# Patient Record
Sex: Female | Born: 1935 | Race: White | Hispanic: No | Marital: Married | Smoking: Never smoker
Health system: Southern US, Community
[De-identification: ages and names within clinical notes are randomized; demographics above are authoritative.]

## PROBLEM LIST (undated history)

## (undated) DIAGNOSIS — L80 Vitiligo: Secondary | ICD-10-CM

## (undated) DIAGNOSIS — E7800 Pure hypercholesterolemia, unspecified: Secondary | ICD-10-CM

## (undated) DIAGNOSIS — I10 Essential (primary) hypertension: Secondary | ICD-10-CM

## (undated) DIAGNOSIS — M353 Polymyalgia rheumatica: Secondary | ICD-10-CM

## (undated) DIAGNOSIS — M40209 Unspecified kyphosis, site unspecified: Secondary | ICD-10-CM

## (undated) DIAGNOSIS — M419 Scoliosis, unspecified: Secondary | ICD-10-CM

## (undated) DIAGNOSIS — A809 Acute poliomyelitis, unspecified: Secondary | ICD-10-CM

## (undated) HISTORY — PX: CATARACT EXTRACTION, BILATERAL: SHX1313

## (undated) HISTORY — DX: Pure hypercholesterolemia, unspecified: E78.00

## (undated) HISTORY — DX: Vitiligo: L80

## (undated) HISTORY — PX: TOTAL SHOULDER ARTHROPLASTY: SHX126

## (undated) HISTORY — DX: Acute poliomyelitis, unspecified: A80.9

## (undated) HISTORY — DX: Polymyalgia rheumatica: M35.3

## (undated) HISTORY — PX: SHOULDER FUSION SURGERY: SHX775

## (undated) HISTORY — DX: Scoliosis, unspecified: M41.9

## (undated) HISTORY — DX: Essential (primary) hypertension: I10

## (undated) HISTORY — DX: Unspecified kyphosis, site unspecified: M40.209

---

## 1991-04-02 HISTORY — PX: ABDOMINAL HYSTERECTOMY: SHX81

## 1999-01-26 ENCOUNTER — Other Ambulatory Visit: Admission: RE | Admit: 1999-01-26 | Discharge: 1999-01-26 | Payer: Self-pay | Admitting: *Deleted

## 1999-08-24 ENCOUNTER — Encounter: Payer: Self-pay | Admitting: Orthopedic Surgery

## 1999-08-24 ENCOUNTER — Ambulatory Visit (HOSPITAL_COMMUNITY): Admission: RE | Admit: 1999-08-24 | Discharge: 1999-08-24 | Payer: Self-pay | Admitting: Orthopedic Surgery

## 2000-04-10 ENCOUNTER — Other Ambulatory Visit: Admission: RE | Admit: 2000-04-10 | Discharge: 2000-04-10 | Payer: Self-pay | Admitting: *Deleted

## 2001-12-17 ENCOUNTER — Ambulatory Visit (HOSPITAL_COMMUNITY): Admission: RE | Admit: 2001-12-17 | Discharge: 2001-12-17 | Payer: Self-pay | Admitting: Gastroenterology

## 2003-08-09 ENCOUNTER — Other Ambulatory Visit: Admission: RE | Admit: 2003-08-09 | Discharge: 2003-08-09 | Payer: Self-pay | Admitting: Obstetrics and Gynecology

## 2008-06-01 ENCOUNTER — Ambulatory Visit: Payer: Self-pay | Admitting: Oncology

## 2008-06-14 LAB — CBC WITH DIFFERENTIAL/PLATELET
BASO%: 0 % (ref 0.0–2.0)
Basophils Absolute: 0 10*3/uL (ref 0.0–0.1)
Eosinophils Absolute: 0 10*3/uL (ref 0.0–0.5)
HCT: 42.2 % (ref 34.8–46.6)
HGB: 14.1 g/dL (ref 11.6–15.9)
LYMPH%: 8.1 % — ABNORMAL LOW (ref 14.0–49.7)
MCHC: 33.5 g/dL (ref 31.5–36.0)
MONO#: 0.1 10*3/uL (ref 0.1–0.9)
NEUT#: 7.3 10*3/uL — ABNORMAL HIGH (ref 1.5–6.5)
NEUT%: 90.7 % — ABNORMAL HIGH (ref 38.4–76.8)
Platelets: 252 10*3/uL (ref 145–400)
WBC: 8 10*3/uL (ref 3.9–10.3)
lymph#: 0.6 10*3/uL — ABNORMAL LOW (ref 0.9–3.3)

## 2008-06-14 LAB — COMPREHENSIVE METABOLIC PANEL
AST: 21 U/L (ref 0–37)
Albumin: 4 g/dL (ref 3.5–5.2)
BUN: 18 mg/dL (ref 6–23)
Calcium: 10 mg/dL (ref 8.4–10.5)
Chloride: 99 mEq/L (ref 96–112)
Glucose, Bld: 167 mg/dL — ABNORMAL HIGH (ref 70–99)
Potassium: 3.8 mEq/L (ref 3.5–5.3)
Sodium: 139 mEq/L (ref 135–145)
Total Protein: 6.9 g/dL (ref 6.0–8.3)

## 2008-06-14 LAB — URIC ACID: Uric Acid, Serum: 4.8 mg/dL (ref 2.4–7.0)

## 2008-06-16 LAB — IMMUNOFIXATION ELECTROPHORESIS: Total Protein, Serum Electrophoresis: 7.3 g/dL (ref 6.0–8.3)

## 2008-06-16 LAB — KAPPA/LAMBDA LIGHT CHAINS
Kappa free light chain: <0.6 mg/dL (ref 0.33–1.94)
Lambda Free Lght Chn: 1.51 mg/dL (ref 0.57–2.63)

## 2008-09-20 ENCOUNTER — Ambulatory Visit: Payer: Self-pay | Admitting: Oncology

## 2008-09-20 LAB — COMPREHENSIVE METABOLIC PANEL
ALT: 13 U/L (ref 0–35)
AST: 14 U/L (ref 0–37)
Alkaline Phosphatase: 36 U/L — ABNORMAL LOW (ref 39–117)
Calcium: 9.5 mg/dL (ref 8.4–10.5)
Chloride: 96 mEq/L (ref 96–112)
Creatinine, Ser: 0.7 mg/dL (ref 0.40–1.20)
Potassium: 3.6 mEq/L (ref 3.5–5.3)

## 2008-09-20 LAB — IGG, IGA, IGM
IgA: 138 mg/dL (ref 68–378)
IgM, Serum: 124 mg/dL (ref 60–263)

## 2008-09-20 LAB — CBC WITH DIFFERENTIAL/PLATELET
BASO%: 0.5 % (ref 0.0–2.0)
EOS%: 0.9 % (ref 0.0–7.0)
MCH: 32.1 pg (ref 25.1–34.0)
MCHC: 34.5 g/dL (ref 31.5–36.0)
MCV: 93 fL (ref 79.5–101.0)
MONO%: 9 % (ref 0.0–14.0)
NEUT%: 63.3 % (ref 38.4–76.8)
RDW: 12.9 % (ref 11.2–14.5)
lymph#: 1.3 10*3/uL (ref 0.9–3.3)

## 2008-09-22 LAB — UIFE/LIGHT CHAINS/TP QN, 24-HR UR
Free Kappa Lt Chains,Ur: 0.66 mg/dL (ref 0.04–1.51)
Free Lambda Lt Chains,Ur: <0.05 mg/dL (ref 0.08–1.01)
Free Lt Chn Excr Rate: 12.05 mg/d
Time: 24 hours
Total Protein, Urine-Ur/day: 22 mg/d (ref 10–140)
Total Protein, Urine: 1.2 mg/dL

## 2008-09-22 LAB — CREATININE CLEARANCE, URINE, 24 HOUR
Collection Interval-CRCL: 24 hours
Creatinine Clearance: 65 mL/min — ABNORMAL LOW (ref 75–115)

## 2008-11-02 ENCOUNTER — Inpatient Hospital Stay (HOSPITAL_COMMUNITY): Admission: EM | Admit: 2008-11-02 | Discharge: 2008-11-07 | Payer: Self-pay | Admitting: Emergency Medicine

## 2009-04-07 ENCOUNTER — Ambulatory Visit: Payer: Self-pay | Admitting: Oncology

## 2009-04-07 LAB — COMPREHENSIVE METABOLIC PANEL
ALT: 14 U/L (ref 0–35)
Albumin: 4.3 g/dL (ref 3.5–5.2)
CO2: 26 mEq/L (ref 19–32)
Glucose, Bld: 127 mg/dL — ABNORMAL HIGH (ref 70–99)
Potassium: 3.7 mEq/L (ref 3.5–5.3)
Sodium: 143 mEq/L (ref 135–145)
Total Protein: 6.7 g/dL (ref 6.0–8.3)

## 2009-04-07 LAB — LACTATE DEHYDROGENASE: LDH: 155 U/L (ref 94–250)

## 2009-04-07 LAB — CBC WITH DIFFERENTIAL/PLATELET
Eosinophils Absolute: 0 10*3/uL (ref 0.0–0.5)
MONO#: 0.3 10*3/uL (ref 0.1–0.9)
NEUT#: 3.3 10*3/uL (ref 1.5–6.5)
RBC: 4.55 10*6/uL (ref 3.70–5.45)
RDW: 13.7 % (ref 11.2–14.5)
WBC: 5.2 10*3/uL (ref 3.9–10.3)
lymph#: 1.6 10*3/uL (ref 0.9–3.3)

## 2009-04-07 LAB — IGG, IGA, IGM: IgG (Immunoglobin G), Serum: 685 mg/dL — ABNORMAL LOW (ref 694–1618)

## 2009-10-11 ENCOUNTER — Ambulatory Visit: Payer: Self-pay | Admitting: Oncology

## 2009-10-13 LAB — CBC WITH DIFFERENTIAL/PLATELET
Eosinophils Absolute: 0 10*3/uL (ref 0.0–0.5)
HCT: 38.2 % (ref 34.8–46.6)
LYMPH%: 18.9 % (ref 14.0–49.7)
MCHC: 34.5 g/dL (ref 31.5–36.0)
MCV: 91.8 fL (ref 79.5–101.0)
MONO%: 9.5 % (ref 0.0–14.0)
NEUT#: 3.8 10*3/uL (ref 1.5–6.5)
NEUT%: 70.5 % (ref 38.4–76.8)
Platelets: 231 10*3/uL (ref 145–400)
RBC: 4.16 10*6/uL (ref 3.70–5.45)

## 2010-04-13 ENCOUNTER — Ambulatory Visit: Payer: Self-pay | Admitting: Oncology

## 2010-04-17 LAB — CBC WITH DIFFERENTIAL/PLATELET
BASO%: 0.4 % (ref 0.0–2.0)
Basophils Absolute: 0 10*3/uL (ref 0.0–0.1)
EOS%: 3 % (ref 0.0–7.0)
Eosinophils Absolute: 0.1 10*3/uL (ref 0.0–0.5)
HCT: 40.2 % (ref 34.8–46.6)
HGB: 13.5 g/dL (ref 11.6–15.9)
LYMPH%: 24.2 % (ref 14.0–49.7)
MCH: 30.7 pg (ref 25.1–34.0)
MCHC: 33.5 g/dL (ref 31.5–36.0)
MCV: 91.6 fL (ref 79.5–101.0)
MONO#: 0.3 10*3/uL (ref 0.1–0.9)
MONO%: 5.9 % (ref 0.0–14.0)
NEUT#: 2.8 10*3/uL (ref 1.5–6.5)
NEUT%: 66.5 % (ref 38.4–76.8)
Platelets: 248 10*3/uL (ref 145–400)
RBC: 4.39 10*6/uL (ref 3.70–5.45)
RDW: 12.2 % (ref 11.2–14.5)
WBC: 4.3 10*3/uL (ref 3.9–10.3)
lymph#: 1 10*3/uL (ref 0.9–3.3)

## 2010-04-19 LAB — COMPREHENSIVE METABOLIC PANEL
ALT: 13 U/L (ref 0–35)
AST: 17 U/L (ref 0–37)
Albumin: 4.5 g/dL (ref 3.5–5.2)
Alkaline Phosphatase: 69 U/L (ref 39–117)
BUN: 16 mg/dL (ref 6–23)
CO2: 31 mEq/L (ref 19–32)
Calcium: 9.9 mg/dL (ref 8.4–10.5)
Chloride: 96 mEq/L (ref 96–112)
Creatinine, Ser: 0.64 mg/dL (ref 0.40–1.20)
Glucose, Bld: 140 mg/dL — ABNORMAL HIGH (ref 70–99)
Potassium: 3.8 mEq/L (ref 3.5–5.3)
Sodium: 139 mEq/L (ref 135–145)
Total Bilirubin: 0.5 mg/dL (ref 0.3–1.2)
Total Protein: 7.1 g/dL (ref 6.0–8.3)

## 2010-04-19 LAB — IMMUNOFIXATION ELECTROPHORESIS
IgA: 200 mg/dL (ref 68–378)
IgG (Immunoglobin G), Serum: 1000 mg/dL (ref 694–1618)
IgM, Serum: 216 mg/dL (ref 60–263)
Total Protein, Serum Electrophoresis: 7.1 g/dL (ref 6.0–8.3)

## 2010-04-19 LAB — LACTATE DEHYDROGENASE: LDH: 142 U/L (ref 94–250)

## 2010-07-07 LAB — CBC
Hemoglobin: 11.5 g/dL — ABNORMAL LOW (ref 12.0–15.0)
Hemoglobin: 11.8 g/dL — ABNORMAL LOW (ref 12.0–15.0)
MCHC: 33.8 g/dL (ref 30.0–36.0)
MCHC: 34 g/dL (ref 30.0–36.0)
MCHC: 34.2 g/dL (ref 30.0–36.0)
MCV: 92.8 fL (ref 78.0–100.0)
MCV: 92.9 fL (ref 78.0–100.0)
Platelets: 209 10*3/uL (ref 150–400)
RBC: 3.68 MIL/uL — ABNORMAL LOW (ref 3.87–5.11)
RBC: 3.75 MIL/uL — ABNORMAL LOW (ref 3.87–5.11)
RBC: 4.15 MIL/uL (ref 3.87–5.11)
RDW: 13.1 % (ref 11.5–15.5)
RDW: 13.2 % (ref 11.5–15.5)
WBC: 5.1 10*3/uL (ref 4.0–10.5)

## 2010-07-07 LAB — BASIC METABOLIC PANEL
BUN: 10 mg/dL (ref 6–23)
CO2: 29 mEq/L (ref 19–32)
CO2: 29 mEq/L (ref 19–32)
Calcium: 8.4 mg/dL (ref 8.4–10.5)
Calcium: 9 mg/dL (ref 8.4–10.5)
Chloride: 100 mEq/L (ref 96–112)
Chloride: 98 mEq/L (ref 96–112)
Creatinine, Ser: 0.56 mg/dL (ref 0.4–1.2)
Creatinine, Ser: 0.57 mg/dL (ref 0.4–1.2)
GFR calc Af Amer: >60 mL/min (ref >60)
GFR calc Af Amer: >60 mL/min (ref >60)
Glucose, Bld: 94 mg/dL (ref 70–99)

## 2010-07-07 LAB — DIFFERENTIAL
Basophils Absolute: 0.1 10*3/uL (ref 0.0–0.1)
Basophils Relative: 1 % (ref 0–1)
Eosinophils Absolute: 0 10*3/uL (ref 0.0–0.7)
Neutro Abs: 9.2 10*3/uL — ABNORMAL HIGH (ref 1.7–7.7)
Neutrophils Relative %: 88 % — ABNORMAL HIGH (ref 43–77)

## 2010-07-07 LAB — SEDIMENTATION RATE: Sed Rate: 20 mm/hr (ref 0–22)

## 2010-07-07 LAB — LIPID PANEL
Cholesterol: 176 mg/dL (ref 0–200)
HDL: 94 mg/dL (ref >39)
Triglycerides: 68 mg/dL (ref <150)

## 2010-07-07 LAB — C-REACTIVE PROTEIN: CRP: 4.3 mg/dL — ABNORMAL HIGH (ref <0.6)

## 2010-08-14 NOTE — H&P (Signed)
Ellen Thomas, Ellen Thomas            ACCOUNT NO.:  [id number]   MEDICAL RECORD NO.:  [id number]          PATIENT TYPE:  INP   LOCATION:  0103                         FACILITY:  [facility] ([place]   PHYSICIAN:  Virgie Dad, MD     DATE OF BIRTH:  1935-11-28   DATE OF ADMISSION:  11/02/2008  DATE OF DISCHARGE:                              HISTORY & PHYSICAL   PRIMARY CARE PHYSICIAN:  Dr. Everlene Other.   ORTHOPEDIC DOCTOR:  Dr. Renae Fickle.   CHIEF COMPLAINT:  Fall injury, painful right hip.   HISTORY OF PRESENT ILLNESS:  This is a 75 year old white female, known  case of childhood polio, on disability from polio, known case of  polymyalgia rheumatica on prednisone therapy, known hypertension, is  admitted through the emergency room for management of fracture of the  right superior and inferior pubic rami after a fall injury this morning.   She was inside her room and for some reason she fell, falling backwards,  and hit the right hip.  She was not able to get up and workup in the  emergency room showed that she has right superior and inferior pubic  rami.  CT scan showed mild atrophy and mild microvascular white matter  disease, old lacunar infarct on the left basal ganglia.  CT scan also  confirmed a small hematoma right posterior scalp near the vertex, no  obvious skull fracture.  CT of the cervical spine showed degenerative  disk disease, relative spinal stenosis C6, C7, no acute findings.  Blood  work in the emergency room showed white count of 10.5, hemoglobin 13.1,  hematocrit 38.6, platelet count 209,000, blood sugar 100, BUN 10,  creatinine 0.57, calcium 9, the serum sodium is 135, potassium is 3.4.   Patient denies any headache or any syncopal attacks.  She does not have  any neurologic complaints in reference to the fall.   PAST HISTORY:  1. 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  She is currently on      disability of the  polio, affecting mostly her left side.  2. Status post hysterectomy.  3. Status post bone graft taken from the right hip and to correct the      bony abnormality on the left shoulder from polio.   CURRENT MEDICAL CONDITIONS:  She has polymyalgia rheumatica, discovered  two years ago and has been on decreasing dose of prednisone.  She is now  taking prednisone 5 mg daily.  She does not know her current sed rate or  CRP.  Mostly, the polymyalgia rheumatica effects the left shoulder,  bilateral arms and bilateral hips.   CURRENT MEDICATIONS:  1. Prednisone 5 mg daily.  2. Lisinopril 10 mg daily for hypertension.  3. Fosamax 75 mg weekly on Sunday for her osteopenia/osteoporosis.  4. Crestor 5 mg daily for hyperlipidemia.   FAMILY HISTORY:  Mother was diabetic, father died of pneumonia.  Her  siblings, brothers and sisters, are well and have no serious medical  conditions.   REVIEW OF SYSTEMS:  Right now she has a hematoma of the vertex from the  fall, has some headache or ache and pain localized to the new hematoma.  No dizziness.  HEENT:  No visual complaints, has glasses.  RESPIRATORY:  No cough, no shortness of breath.  HEART:  No chest pain, no shortness  of breath, no palpitations, no exertional dyspnea, no dyspnea at rest,  no orthopnea.  GASTROINTESTINAL:  Nauseous at this point in time but no  chronic complaints in reference to the gastrointestinal tract, although  initially had problems taking the Fosamax but now she has tolerated it  well.  GENITOURINARY:  Status post hysterectomy, not on any estrogen  replacement therapy, no vaginal discharge or pruritus, no urinary  complaints, no polyuria, no dysuria.   PHYSICAL EXAMINATION:  VITAL SIGNS:  Temperature is 97.7, blood pressure  150/60, pulse is 73, respirations 16, O2 sat 100%.  SKIN:  No open abrasion except for a small hematoma with no gross  bleeding over the vertex of the skull, no sign of depressed fracture in  the  head or calvarium.  HEENT:  Pupils are equal and reacting to light.  Funduscopy not done,  the patient states the light hurts her eye and she can not keep still  because of the generalized pain from the recent fall.  There is no  cataract, there is no scleral icterus.  Pupils are equal and reacting to  light.  NECK:  Supple, no meningismus, the carotids are normal in upstroke, no  bruit.  Thyroid normal in size, no goiter.  The neck veins are not  distended.  CHEST:  Clear to palpation and auscultation, no wheezing, no rales.  COR:  Ranged between 70 - 90 per minute, normal rhythm, sinus S1 - S2  normal, no murmur, no gallop, no S4, PMI normal locations.  ABDOMEN:  Scaphoid, easy to palpate liver and spleen, bowel sounds are  normal, no tenderness, no guarding.  EXTREMITIES:  There is a limited range of motion of the right hip with  tenderness even on passive range of motion.  The left hip normal range  of motion.  LOWER EXTREMITIES:  The left calf is more pronounced than the right with  bilateral muscular atrophy from the polio in childhood.  Reflexes are  normal, there is no paresis.  NEUROLOGIC:  Cerebral function:  Memory intact, alert, awake, oriented.  Cerebellar function:  Finger to nose test is intact, gait not tested  because of the recent fall injury with fracture of the right hip.  Cranial nerves and spinal nerves are normal.  Noted generalized muscular  atrophy of the lower extremities with more prominence of the calf muscle  on the left lower leg but no sign of DVT.  Peripheral arterial  circulation is intact, pedal pulses are normal.   ASSESSMENT AND PLAN:  This is a 75 year old white female, known case of  polio in childhood with disability from polio, known case of polymyagia  rheumatica on prednisone therapy, is admitted for medical management and  rehab of nondisplaced fracture of the right superior and inferior rami  of the right hip.  1. Nondisplaced fracture of  the right superior and inferior pubic      ramus fracture right hip.  Dr. Renae Fickle, her family orthopedic      according to the emergency department doctor, has been notified      about this admission and he will swing by to see the patient.  Standard orthopedic fracture orders are done.  Analgesia with      oxycodone 5 mg q.4 hours p.r.n. for pain, morphine 2 mg IV q.4      hours p.r.n. for severe pain.  2. Hematoma vertex with no open wound.  CT of the head no fracture of      the calvarium, no new cerebrovascular accident.  Will monitor      neurologic vital signs q.4 hours for 24 hrs  3. Polymyalgia rheumatica, continue prednisone 5 mg daily.  Will not      give stress dose of hydrocortisone, patient seems to be doing well,      vital signs are normal and no need to give stress dose      hydrocortisone unless for some reason she goes for surgical      intervention which is very unlikely with the location of the      fracture.  Will order CRP and sed rate.  Patient advised to      continue after discharge to keep an appointment with her      rheumatologist.  4. Hypertension, current blood pressure 150/60, continue lisinopril 10      mg p.o. daily.  5. Hyperlipidemia, continue Crestor 5 mg bedtime, do fasting lipids in      the morning.  6. Osteoporosis/osteopenia, continue Fosamax 75 mg once weekly,      usually taken every Sunday.  Patient does not know when her last      DEXA scan was.   PROGNOSIS:  Good.   ETHICS:  Full code.      Virgie Dad, MD  Electronically Signed     EA/MEDQ  D:  11/02/2008  T:  11/02/2008  Job:  [id number]   cc:   Tracey Harries, M.D.  Fax: [phone]

## 2010-08-14 NOTE — Discharge Summary (Signed)
Ellen Thomas, Ellen Thomas            ACCOUNT NO.:  [id number]   MEDICAL RECORD NO.:  [id number]          PATIENT TYPE:  INP   LOCATION:  1504                         FACILITY:  [facility]   PHYSICIAN:  Hillery Aldo, M.D.   DATE OF BIRTH:  [birth year]   DATE OF ADMISSION:  11/02/2008  DATE OF DISCHARGE:  11/06/2008                               DISCHARGE SUMMARY   PRIMARY CARE PHYSICIAN:  Dr. Everlene Other.   ORTHOPEDIC SURGEON:  Dr. Renae Fickle.   DISCHARGE DIAGNOSES:  1. Traumatic fall.  2. Right superior and inferior pubic rami fractures.  3. Small scalp hematoma.  4. Spinal stenosis of C6-C7.  5. 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  6. Polymyalgia rheumatica.  7. Hypertension.  8. Dyslipidemia.  9. Osteoporosis.  10.Cerebrovascular disease with old lacunar infarcts noted on CT of      the head.   DISCHARGE MEDICATIONS:  1. Prednisone 5 mg p.o. daily.  2. Crestor or 5 mg p.o. q. p.m.  3. Lisinopril increased to 30 mg p.o. daily.  4. Fosamax 70 mg p.o. q. Sunday.  5. Tums 500 to 1000 mg p.o. q.4 hours p.r.n. dyspepsia.  6. Oxycodone 2.5 to 5 mg p.o. q.4 h p.r.n. pain.  7. MiraLax 17 grams in 8 ounces of fluids daily p.r.n.  8. Colace 100 mg p.o. daily.  9. Aspirin 81 mg daily.   CONSULTATIONS:  Dr. Renae Fickle of orthopedics.   BRIEF ADMISSION HISTORY OF PRESENT ILLNESS:  The patient is a 75-year-  old female who presented to the hospital after a fall injury.  The  patient had a conscious fall, landing on her right hip with significant  pain afterwards.  She subsequently presented to the hospital for  evaluation and was noted to have a right superior and inferior rami  fracture.  She subsequently was referred to the hospitalist service for  evaluation and treatment.  For full details, please see the dictated  report done by Dr. Axel Filler.   PROCEDURES AND DIAGNOSTIC STUDIES:  1. CT scan of the head showed mild atrophy and moderate microvascular      white matter disease with an old  lacunar infarct in the left basal      ganglia.  Small hematoma in the right posterior scalp and the      vertex.  No obvious fracture of the calvarium or other acute      findings.  2. CT scan of the cervical spine on November 02, 2008 showed degenerative      disk disease.  Relative spinal stenosis C6-C7.  No other acute      findings.  3. Right femur film on November 02, 2008 showed no femur fracture or      dislocation.  4. Right hip film on November 02, 2008 showed right superior inferior      pubic ramus fractures.   DISCHARGE LABORATORY VALUES:  White blood cell count was 5.5, hemoglobin  11.5, hematocrit 34.1, platelets 153,000.  Sodium was 134, potassium  3.7, chloride 100, bicarb 29, BUN 8, creatinine 0.56, glucose 94,  calcium 8.4.  Sedimentation rate was 20.  Cholesterol  was 176,  triglycerides 68, HDL 94, LDL 68.   HOSPITAL COURSE BY PROBLEM:  1. Traumatic fall with acute right superior and inferior pubic ramus      fractures:  The patient was seen and evaluated by Dr. Renae Fickle of      orthopedic surgery as well as PT and OT.  Recommendations were for      skilled nursing facility placement for rehab.  At this point, the      patient's pain is well-controlled on oxycodone and the plan is to      discharge her to the rehab facility in the morning.  2. Polymyalgia rheumatica:  The patient was maintained on her usual      dose of prednisone.  Her sed rate is within normal limits.  She      should follow up with her primary care physician regarding timing      as to when to begin to titrate prednisone further.  3. Hypertension:  The patient's blood pressure was suboptimally      controlled and her ACE inhibitor dose was titrated up.  At this      point, we are titrating up to 30 mg daily.  She will need      outpatient followup to determine if her blood pressure is well      controlled with this change.  4. Dyslipidemia:  Fasting lipid panel checked and found to be within      normal  limits.  Her lipids are well controlled on her usual dose of      Crestor.  5. Osteoporosis:  The patient's Fosamax was held secondary to the      hospital policy regarding bisphosphonates.  This can be resumed at      discharge.  6. Cerebrovascular disease with old lacunar infarct:  The patient was      put on low-dose aspirin therapy and attention was made towards      appropriate blood pressure control.   DISPOSITION:  The patient is medically stable and will be discharged to  her skilled nursing facility for rehab in the morning.   CONDITION ON DISCHARGE:  Improved.      Hillery Aldo, M.D.  Electronically Signed     CR/MEDQ  D:  11/06/2008  T:  11/06/2008  Job:  [id number]   cc:   [name], M.D.  Fax: [phone]   Tracey Harries, M.D.  Fax: [phone]

## 2010-08-17 NOTE — Op Note (Signed)
   NAME:  Ellen Thomas, Ellen Thomas                      ACCOUNT NO.:  [id number]   MEDICAL RECORD NO.:  [id number]                   PATIENT TYPE:  AMB   LOCATION:  ENDO                                 FACILITY:  [facility]   PHYSICIAN:  James L. Malon Kindle., M.D.          DATE OF BIRTH:  1936-01-18   DATE OF PROCEDURE:  12/17/2001  DATE OF DISCHARGE:                                 OPERATIVE REPORT   PROCEDURE PERFORMED:  Colonoscopy.   ENDOSCOPIST:  Llana Aliment. Edwards, M.D.   MEDICATIONS:  Fentanyl 60 mcg, Versed 6 mg IV.   INSTRUMENT USED:  Pediatric Olympus video colonoscope.   INDICATIONS FOR PROCEDURE:  Colon cancer screening.   DESCRIPTION OF PROCEDURE:  The procedure had been explained to the patient  and consent obtained.  With the patient in the left lateral decubitus  position, the pediatric Olympus video colonoscope was inserted and advanced  under direct visualization.  The prep was excellent.  We were able to reach  the cecum without difficulty using abdominal pressure.  The ileocecal valve  and appendiceal orifice were seen.  The scope was withdrawn and the cecum,  ascending colon, hepatic flexure, transverse colon, splenic flexure,  descending and sigmoid colon were seen well.  Scattered diverticula in the  sigmoid colon.  No polyps were seen throughout the entire colon.  The rectum  was free of polyps.  No real significant internal hemorrhoids.   ASSESSMENT:  1. Normal colonoscopy other than scattered diverticulosis of a moderate     nature in the sigmoid colon, no polyps were seen.   PLAN:  We recommend yearly hemoccults, possible screening colon in 10 years  depending on the results at that time and return as needed.                                               James L. Malon Kindle., M.D.    Waldron Session  D:  12/17/2001  T:  12/18/2001  Job:  [id number]   cc:   Aura Dials, M.D.

## 2010-10-16 ENCOUNTER — Encounter (HOSPITAL_BASED_OUTPATIENT_CLINIC_OR_DEPARTMENT_OTHER): Payer: Medicare Other | Admitting: Oncology

## 2010-10-16 ENCOUNTER — Other Ambulatory Visit (HOSPITAL_COMMUNITY): Payer: Self-pay | Admitting: Oncology

## 2010-10-16 DIAGNOSIS — D472 Monoclonal gammopathy: Secondary | ICD-10-CM

## 2010-10-16 LAB — CBC WITH DIFFERENTIAL/PLATELET
Basophils Absolute: 0 10*3/uL (ref 0.0–0.1)
EOS%: 0.1 % (ref 0.0–7.0)
MCHC: 33.7 g/dL (ref 31.5–36.0)
MCV: 89.6 fL (ref 79.5–101.0)
NEUT#: 5.9 10*3/uL (ref 1.5–6.5)
NEUT%: 87.7 % — ABNORMAL HIGH (ref 38.4–76.8)
Platelets: 209 10*3/uL (ref 145–400)
RBC: 4.17 10*6/uL (ref 3.70–5.45)
RDW: 14.9 % — ABNORMAL HIGH (ref 11.2–14.5)
WBC: 6.8 10*3/uL (ref 3.9–10.3)

## 2010-10-17 LAB — COMPREHENSIVE METABOLIC PANEL
BUN: 15 mg/dL (ref 6–23)
CO2: 28 mEq/L (ref 19–32)
Calcium: 9.7 mg/dL (ref 8.4–10.5)
Chloride: 95 mEq/L — ABNORMAL LOW (ref 96–112)
Creatinine, Ser: 0.64 mg/dL (ref 0.50–1.10)
Glucose, Bld: 156 mg/dL — ABNORMAL HIGH (ref 70–99)

## 2010-10-17 LAB — IGG, IGA, IGM
IgA: 156 mg/dL (ref 69–380)
IgM, Serum: 204 mg/dL (ref 52–322)

## 2010-10-17 LAB — LACTATE DEHYDROGENASE: LDH: 146 U/L (ref 94–250)

## 2011-03-28 ENCOUNTER — Telehealth: Payer: Self-pay | Admitting: Oncology

## 2011-03-28 NOTE — Telephone Encounter (Signed)
l/m to advise of changing appt from [place]inson to sara on 1/17  per robin from 03/28/11  aom

## 2011-04-18 ENCOUNTER — Other Ambulatory Visit: Payer: Medicare Other | Admitting: Lab

## 2011-04-18 ENCOUNTER — Ambulatory Visit (HOSPITAL_BASED_OUTPATIENT_CLINIC_OR_DEPARTMENT_OTHER): Payer: Medicare Other | Admitting: Physician Assistant

## 2011-04-18 ENCOUNTER — Other Ambulatory Visit (HOSPITAL_BASED_OUTPATIENT_CLINIC_OR_DEPARTMENT_OTHER): Payer: Medicare Other | Admitting: Lab

## 2011-04-18 VITALS — BP 146/61 | HR 70 | Temp 97.6°F | Ht 61.5 in | Wt 104.8 lb

## 2011-04-18 DIAGNOSIS — D472 Monoclonal gammopathy: Secondary | ICD-10-CM

## 2011-04-18 LAB — CBC WITH DIFFERENTIAL/PLATELET
BASO%: 0.5 % (ref 0.0–2.0)
Basophils Absolute: 0 10*3/uL (ref 0.0–0.1)
EOS%: 1.2 % (ref 0.0–7.0)
HGB: 14.2 g/dL (ref 11.6–15.9)
MCH: 31.9 pg (ref 25.1–34.0)
MCV: 94.2 fL (ref 79.5–101.0)
MONO%: 6.3 % (ref 0.0–14.0)
RBC: 4.45 10*6/uL (ref 3.70–5.45)
RDW: 12.4 % (ref 11.2–14.5)
lymph#: 1.3 10*3/uL (ref 0.9–3.3)

## 2011-04-18 NOTE — Progress Notes (Signed)
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  The patient reports since her last clinic visit in July 2012 that she has been taking her prednisone since last Fall, due to a flare of PMR. She is currently on 3mg  a day, switching in 4 days to 2 mg a day, to taper off early March. She is "almost asymptomatic, feeling a lot better since the flare"   She reports overall normal energy level.  No fevers, chills or night sweats.  No shortness of breath or cough.  She has normal appetite and has had no issues with nausea, vomiting, constipation or diarrhea.  No dysuria, urinary frequency or hematuria.  No alteration in sensation or balance or swelling of extremities.  She does report occasional discomfort in her groin and thigh area, which she states has been a chronic problem and has not changed over baseline.  She is not experiencing any discomfort today.  On 10/16/10 the IgG level was 888, IgA 156 and IgM 204, while on 04/17/2010 had been an IgG 1000, IgA 200 and IgM 216.  New labs are pending.  REVIEW OF SYSTEMS:  The rest of the 14-point review of system was negative.   Filed Vitals:   04/18/11 0837  BP: 146/61  Pulse: 70  Temp: 97.6 F (36.4 C)   Wt Readings from Last 3 Encounters:  04/18/11 104 lb 12.8 oz (47.537 kg)     MEDICATIONS:  Medicines are reviewed and recorded.     PHYSICAL EXAMINATION:    In general, this is a well-developed, well-nourished white female in no acute distress.  HEENT:  Sclerae nonicteric.  There is no thrush or mucositis.  Skin:  No rashes or lesions. Lymph:  No peripheral lymphadenopathy.  Cardiac:  Regular rate and rhythm without murmurs or gallops.  Peripheral pulses are palpable.  Chest:  Lungs are clear to auscultation.  Abdomen:  Positive bowel sounds, soft,  nontender.  No organomegaly.  Extremities:  Without edema or cyanosis.  Neuro:  Alert and oriented times three.  Strength, sensation and coordination all grossly intact.      LABORATORY/RADIOLOGY DATA:   Lab 04/18/11 0801  WBC 4.9  HGB 14.2  HCT 41.9  PLT 211  MCV 94.2  MCH 31.9  MCHC 33.8  RDW 12.4  LYMPHSABS 1.3  MONOABS 0.3  EOSABS 0.1  BASOSABS 0.0  BANDABS --    CMP Pending.  No results found for this basename: NA:5,K:5,CL:5,CO2:5,GLUCOSE:5,BUN:5,CREATININE:5,GFRCGP,:5,CALCIUM:5,MG:5,AST:5,ALT:5,ALKPHOS:5,BILITOT:5 in the last 168 hours      Component Value Date/Time   BILITOT 0.5 10/16/2010 1341   BILITOT 0.5 10/16/2010 1341     Radiology Studies:  No results found.     ASSESSMENT AND PLAN:  1.  [name] is a 76 year old white female with IgM lambda monoclonal gammopathy first detected December 2009.  Her quantitative immunoglobulins and serum immunofixation is currently pending and the patient will be contacted with results.  2.  We will tentatively schedule patient for follow-up lab visit in six months' time at which time we will reassess CBC with diff, CMET, LDH and quantitative immunoglobulins and then followup with Dr. Arline Asp in one year's time at which time we will reassess CBC with diff, CMET, LDH, quantitative immunoglobulin and immunofixation electrophoresis once current protein studies are reviewed by Dr. Arline Asp, we may make  adjustments in the patient's follow-up visits and she is notified of this.

## 2011-04-22 LAB — COMPREHENSIVE METABOLIC PANEL
ALT: 12 U/L (ref 0–35)
AST: 15 U/L (ref 0–37)
Albumin: 4.5 g/dL (ref 3.5–5.2)
Alkaline Phosphatase: 46 U/L (ref 39–117)
BUN: 15 mg/dL (ref 6–23)
Calcium: 9.6 mg/dL (ref 8.4–10.5)
Chloride: 99 mEq/L (ref 96–112)
Potassium: 3.7 mEq/L (ref 3.5–5.3)
Sodium: 138 mEq/L (ref 135–145)
Total Protein: 7.1 g/dL (ref 6.0–8.3)

## 2011-04-22 LAB — IMMUNOFIXATION ELECTROPHORESIS

## 2011-09-20 ENCOUNTER — Telehealth: Payer: Self-pay | Admitting: Oncology

## 2011-09-20 NOTE — Telephone Encounter (Signed)
s/w pt and moved her appt per dr dm req   aom

## 2011-10-08 ENCOUNTER — Telehealth: Payer: Self-pay | Admitting: Oncology

## 2011-10-08 NOTE — Telephone Encounter (Signed)
Time of 8/2 appt changed to 8:45 am (provider [employer]). lmonvm for pt and mailed new schedule.

## 2011-10-17 ENCOUNTER — Other Ambulatory Visit: Payer: Medicare Other | Admitting: Lab

## 2011-10-17 ENCOUNTER — Ambulatory Visit: Payer: Medicare Other | Admitting: Oncology

## 2011-11-01 ENCOUNTER — Ambulatory Visit (HOSPITAL_BASED_OUTPATIENT_CLINIC_OR_DEPARTMENT_OTHER): Payer: Medicare Other | Admitting: Oncology

## 2011-11-01 ENCOUNTER — Encounter: Payer: Self-pay | Admitting: Oncology

## 2011-11-01 ENCOUNTER — Other Ambulatory Visit: Payer: Medicare Other | Admitting: Lab

## 2011-11-01 VITALS — BP 127/62 | HR 84 | Temp 99.8°F | Resp 18 | Ht 61.5 in | Wt 102.9 lb

## 2011-11-01 DIAGNOSIS — D472 Monoclonal gammopathy: Secondary | ICD-10-CM

## 2011-11-01 DIAGNOSIS — M810 Age-related osteoporosis without current pathological fracture: Secondary | ICD-10-CM

## 2011-11-01 LAB — CBC WITH DIFFERENTIAL/PLATELET
Basophils Absolute: 0 10*3/uL (ref 0.0–0.1)
Eosinophils Absolute: 0.1 10*3/uL (ref 0.0–0.5)
HCT: 40.6 % (ref 34.8–46.6)
HGB: 13.7 g/dL (ref 11.6–15.9)
LYMPH%: 25.7 % (ref 14.0–49.7)
MCV: 91.2 fL (ref 79.5–101.0)
MONO%: 7.5 % (ref 0.0–14.0)
NEUT#: 2.5 10*3/uL (ref 1.5–6.5)
NEUT%: 64.1 % (ref 38.4–76.8)
Platelets: 207 10*3/uL (ref 145–400)
RDW: 13 % (ref 11.2–14.5)

## 2011-11-01 NOTE — Progress Notes (Signed)
CC:   Pollyann Savoy, M.D. Tracey Harries, M.D.  PROBLEM LIST: 1. IgM lambda monoclonal gammopathy, first detected in December 2009     with normal quantitative immunoglobulins, negative urine     immunofixation electrophoresis, and a normal 24-hour urine protein. 2. 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 3. Polymyalgia rheumatica diagnosed November 2009. 4. History of osteoporosis. 5. Dyslipidemia. 6. Hypertension.  MEDICATIONS: 1. Biotin 2500 mcg daily. 2. Cholecalciferol 1000 units daily. 3. Plaquenil 200 mg daily. 4. Prednisone, current dose 3 mg daily, adjusted for symptoms of     polymyalgia rheumatica. 5. Crestor 5 mg daily. 6. Micardis 20 mg daily.  SMOKING HISTORY:  The patient has never smoked cigarettes.  HISTORY:  The patient was seen today for followup of her IgM lambda monoclonal gammopathy, which at this point is most likely benign.  The patient has been followed conservatively.  She was last seen by [place] on 04/18/2011.  She is currently on prednisone 3 mg daily for her polymyalgia rheumatica.  The patient will try to decrease the dose by 1 mg a day every month.  She adjusts her prednisone according to symptoms. Quantitative immunoglobulin levels have been stable.  The patient is without symptoms to suggest either a lympho or plasmacytic disorder.  PHYSICAL EXAMINATION:  General:  The patient looks well.  Shows no obvious changes.  Vital Signs:  Weight is stable at 102.9 pounds, height 5 feet 1-1/2 inches, body surface area 1.42 sq m.  Blood pressure 127/62.  Temperature 99.8.  Other vital signs are normal.  HEENT:  There is no scleral icterus.  Mouth and pharynx are benign.  Lymph:  No peripheral adenopathy palpable.  Heart and Lungs:  Normal.  Abdomen: Benign with no organomegaly or masses palpable.  Extremities:  No peripheral edema or clubbing.  Neurologic Exam:  Normal.  No areas of musculoskeletal  tenderness.  LABORATORY DATA:  Today white count 3.9, ANC 2.5, hemoglobin 13.7, hematocrit 40.6, platelets 207,000.  Chemistries and quantitative immunoglobulins, also serum immunofixation electrophoresis are pending. Chemistries from 04/18/2011 were normal.  On 04/18/2011, IgG level was 765, IgA 135, and IgM level 228.  Serum immunofixation electrophoresis showed a monoclonal IgM lambda protein present.  IMAGING STUDIES: 1. CT scan of the cervical spine without IV contrast on 11/02/2008     showed degenerative disk disease and a relative spinal stenosis at     C6-7. 2. CT scan of the head without IV contrast on 11/02/2008 showed mild     atrophy and moderate microvascular white matter disease with an old     lacunar infarct in the left basal ganglia.  There was a small     hematoma in the right posterior scalp near the vertex.  There was     no obvious fracture of the calvarium or other acute findings.  IMPRESSION AND PLAN:  The patient remains with what we believe to be a benign monoclonal gammopathy.  I went over her lab data with her to better explain to her the reason for her referral here by Dr. Corliss Skains approximately 3-1/2 years ago and why we continue to follow her.  I explained that some patients with monoclonal gammopathy do have a malignant disease or may develop a malignant disease.  We will check quantitative immunoglobulins in 6 months and I would like to see the patient again in 1 year, at which time we will check CBC, chemistries, and quantitative immunoglobulins.    ______________________________  Samul Dada, M.D. DSM/MEDQ  D:  11/01/2011  T:  11/01/2011  Job:  [id number]

## 2011-11-01 NOTE — Progress Notes (Signed)
This office note has been dictated.  #[id number]

## 2011-11-05 LAB — COMPREHENSIVE METABOLIC PANEL
Albumin: 4.4 g/dL (ref 3.5–5.2)
Alkaline Phosphatase: 46 U/L (ref 39–117)
BUN: 13 mg/dL (ref 6–23)
Glucose, Bld: 82 mg/dL (ref 70–99)
Potassium: 4 mEq/L (ref 3.5–5.3)

## 2011-11-05 LAB — IMMUNOFIXATION ELECTROPHORESIS
IgG (Immunoglobin G), Serum: 752 mg/dL (ref 690–1700)
Total Protein, Serum Electrophoresis: 6.5 g/dL (ref 6.0–8.3)

## 2011-11-07 ENCOUNTER — Telehealth: Payer: Self-pay | Admitting: *Deleted

## 2011-11-07 NOTE — Telephone Encounter (Signed)
mailed out calendar and gave to the patient informing the patient of the new date and time on 05-04-2012 and 11-06-2012 lab and md appointment

## 2012-05-04 ENCOUNTER — Other Ambulatory Visit (HOSPITAL_BASED_OUTPATIENT_CLINIC_OR_DEPARTMENT_OTHER): Payer: Medicare PPO | Admitting: Lab

## 2012-05-04 DIAGNOSIS — D472 Monoclonal gammopathy: Secondary | ICD-10-CM

## 2012-05-04 LAB — IGG, IGA, IGM
IgA: 133 mg/dL (ref 69–380)
IgG (Immunoglobin G), Serum: 714 mg/dL (ref 690–1700)
IgM, Serum: 206 mg/dL (ref 52–322)

## 2012-11-05 ENCOUNTER — Telehealth: Payer: Self-pay | Admitting: Hematology and Oncology

## 2012-11-05 NOTE — Telephone Encounter (Signed)
Pt called today to r/s 8/8 appt to 9/23. Pt aware of new d/t.

## 2012-11-06 ENCOUNTER — Ambulatory Visit: Payer: Medicare Other

## 2012-11-06 ENCOUNTER — Other Ambulatory Visit: Payer: Medicare Other

## 2012-12-21 ENCOUNTER — Other Ambulatory Visit: Payer: Self-pay | Admitting: Medical Oncology

## 2012-12-21 DIAGNOSIS — D472 Monoclonal gammopathy: Secondary | ICD-10-CM

## 2012-12-22 ENCOUNTER — Telehealth: Payer: Self-pay | Admitting: Internal Medicine

## 2012-12-22 ENCOUNTER — Ambulatory Visit (HOSPITAL_BASED_OUTPATIENT_CLINIC_OR_DEPARTMENT_OTHER): Payer: Medicare PPO | Admitting: Internal Medicine

## 2012-12-22 ENCOUNTER — Other Ambulatory Visit (HOSPITAL_BASED_OUTPATIENT_CLINIC_OR_DEPARTMENT_OTHER): Payer: Medicare PPO | Admitting: Lab

## 2012-12-22 VITALS — BP 166/68 | HR 80 | Temp 98.0°F | Resp 18 | Ht 61.5 in | Wt 103.5 lb

## 2012-12-22 DIAGNOSIS — D472 Monoclonal gammopathy: Secondary | ICD-10-CM

## 2012-12-22 LAB — CBC WITH DIFFERENTIAL/PLATELET
Basophils Absolute: 0 10*3/uL (ref 0.0–0.1)
HCT: 39.9 % (ref 34.8–46.6)
HGB: 13.4 g/dL (ref 11.6–15.9)
LYMPH%: 19.1 % (ref 14.0–49.7)
MCH: 29.7 pg (ref 25.1–34.0)
MCV: 88.9 fL (ref 79.5–101.0)
MONO#: 0.4 10*3/uL (ref 0.1–0.9)
NEUT%: 70.2 % (ref 38.4–76.8)
Platelets: 214 10*3/uL (ref 145–400)
RBC: 4.49 10*6/uL (ref 3.70–5.45)
WBC: 4.6 10*3/uL (ref 3.9–10.3)
lymph#: 0.9 10*3/uL (ref 0.9–3.3)

## 2012-12-22 LAB — COMPREHENSIVE METABOLIC PANEL (CC13)
ALT: 11 U/L (ref 0–55)
Albumin: 4 g/dL (ref 3.5–5.0)
BUN: 10.6 mg/dL (ref 7.0–26.0)
CO2: 25 mEq/L (ref 22–29)
Calcium: 9.8 mg/dL (ref 8.4–10.4)
Chloride: 97 mEq/L — ABNORMAL LOW (ref 98–109)
Creatinine: 0.7 mg/dL (ref 0.6–1.1)
Total Bilirubin: 0.59 mg/dL (ref 0.20–1.20)

## 2012-12-22 LAB — IGG, IGA, IGM
IgA: 122 mg/dL (ref 69–380)
IgG (Immunoglobin G), Serum: 694 mg/dL (ref 690–1700)
IgM, Serum: 193 mg/dL (ref 52–322)

## 2012-12-22 NOTE — Telephone Encounter (Signed)
Gave pt appt for lab and MD on June 2014  °

## 2012-12-22 NOTE — Progress Notes (Signed)
[facility] OFFICE PROGRESS NOTE  No primary provider on file. No primary provider on file.  DIAGNOSIS: MGUS (monoclonal gammopathy of unknown significance)  CC: IgM lambda monoclonal gammopathy  CURRENT THERAPY: Observation  INTERVAL HISTORY: Ellen Thomas 77 y.o. female with a history of poliomyelitis with residual left-sided Thomas, Ellen rheumatica (2009) and recently MGUS presents for a one-year followup.  She was last seen by Dr. Arline Asp on 11/01/2011.  Today, she reports doing well overall.  She denies any recent hospitalizations or emergency room visits.  Her colonoscopy was done this year.  She denies fever or chills or bone pain or urinary changes.  She has been off her prednisone over the past year for her Ellen rheumatica.   MEDICAL HISTORY:No past medical history on file.  INTERIM HISTORY: has MGUS (monoclonal gammopathy of unknown significance) on her problem list.    ALLERGIES:  is allergic to tetracyclines & related.  MEDICATIONS: has a current medication list which includes the following prescription(s): biotin, cholecalciferol, hydroxychloroquine, rosuvastatin, and telmisartan.  SURGICAL HISTORY: No past surgical history on file.  REVIEW OF SYSTEMS:   Constitutional: Denies fevers, chills or abnormal weight loss Eyes: Denies blurriness of vision Ears, nose, mouth, throat, and face: Denies mucositis or sore throat Respiratory: Denies cough, dyspnea or wheezes Cardiovascular: Denies palpitation, chest discomfort or lower extremity swelling Gastrointestinal:  Denies nausea, heartburn or change in bowel habits Skin: Denies abnormal skin rashes Lymphatics: Denies new lymphadenopathy or easy bruising Neurological:Denies numbness, tingling or new weaknesses Behavioral/Psych: Mood is stable, no new changes  All other systems were reviewed with the patient and are negative.  PHYSICAL EXAMINATION: ECOG PERFORMANCE STATUS: 0 -  Asymptomatic  Blood pressure 166/68, pulse 80, temperature 98 F (36.7 C), temperature source Oral, resp. rate 18, height 5' 1.5" (1.562 m), weight 103 lb 8 oz (46.947 kg).  GENERAL:alert, no distress and comfortable with left sided Thomas and atrophy related to her polio.  SKIN: skin color, texture, turgor are normal, no rashes or significant lesions EYES: normal, Conjunctiva are pink and non-injected, sclera clear OROPHARYNX:no exudate, no erythema and lips, buccal mucosa, and tongue normal  NECK: supple, thyroid normal size, non-tender, without nodularity LYMPH:  no palpable lymphadenopathy in the cervical, axillary or inguinal LUNGS: clear to auscultation and percussion with normal breathing effort HEART: regular rate & rhythm and no murmurs and no lower extremity edema ABDOMEN:abdomen soft, non-tender and normal bowel sounds Musculoskeletal:no cyanosis of digits and no clubbing  NEURO: alert & oriented x 3 with fluent speech, no focal motor/sensory deficits   LABORATORY DATA: Results for orders placed in visit on 12/22/12 (from the past 48 hour(s))  CBC WITH DIFFERENTIAL     Status: None   Collection Time    12/22/12  2:43 PM      Result Value Range   WBC 4.6  3.9 - 10.3 10e3/uL   NEUT# 3.3  1.5 - 6.5 10e3/uL   HGB 13.4  11.6 - 15.9 g/dL   HCT 16.1  09.6 - 04.5 %   Platelets 214  145 - 400 10e3/uL   MCV 88.9  79.5 - 101.0 fL   MCH 29.7  25.1 - 34.0 pg   MCHC 33.4  31.5 - 36.0 g/dL   RBC 4.09  8.11 - 9.14 10e6/uL   RDW 13.0  11.2 - 14.5 %   lymph# 0.9  0.9 - 3.3 10e3/uL   MONO# 0.4  0.1 - 0.9 10e3/uL   Eosinophils Absolute 0.1  0.0 -  0.5 10e3/uL   Basophils Absolute 0.0  0.0 - 0.1 10e3/uL   NEUT% 70.2  38.4 - 76.8 %   LYMPH% 19.1  14.0 - 49.7 %   MONO% 7.8  0.0 - 14.0 %   EOS% 2.2  0.0 - 7.0 %   BASO% 0.7  0.0 - 2.0 %  COMPREHENSIVE METABOLIC PANEL (CC13)     Status: Abnormal   Collection Time    12/22/12  2:43 PM      Result Value Range   Sodium 134 (*) 136 - 145  mEq/L   Potassium 3.9  3.5 - 5.1 mEq/L   Chloride 97 (*) 98 - 109 mEq/L   CO2 25  22 - 29 mEq/L   Glucose 118  70 - 140 mg/dl   BUN 14.7  7.0 - 82.9 mg/dL   Creatinine 0.7  0.6 - 1.1 mg/dL   Total Bilirubin 5.62  0.20 - 1.20 mg/dL   Alkaline Phosphatase 67  40 - 150 U/L   AST 17  5 - 34 U/L   ALT 11  0 - 55 U/L   Total Protein 7.4  6.4 - 8.3 g/dL   Albumin 4.0  3.5 - 5.0 g/dL   Calcium 9.8  8.4 - 13.0 mg/dL      RADIOGRAPHIC STUDIES: No results found.  ASSESSMENT: IgM lambda monoclonal gammopathy, first detected in December 2009 with normal quantitative immunoglobulins, negative urine immunofixation electrophoresis, and a normal 24-hour urine protein.  PLAN:  --The patient remains with benign monoclonal gammopathy. We reviewed her lab data in detail.  We again explained that some patients with monoclonal gammopathy do have a malignant disease or may develop a malignant disease. We will check quantitative immunoglobulins, CBC and chemistires and she will return to the clinic in 9 months.    All questions were answered. The patient knows to call the clinic with any problems, questions or concerns. We can certainly see the patient much sooner if necessary.  I spent 15 minutes counseling the patient face to face. The total time spent in the appointment was 30 minutes.  Maryln Eastham, MD 12/22/2012 3:48 PM

## 2012-12-23 ENCOUNTER — Encounter: Payer: Self-pay | Admitting: Internal Medicine

## 2013-03-11 DIAGNOSIS — L989 Disorder of the skin and subcutaneous tissue, unspecified: Secondary | ICD-10-CM | POA: Insufficient documentation

## 2013-03-11 DIAGNOSIS — Z135 Encounter for screening for eye and ear disorders: Secondary | ICD-10-CM | POA: Insufficient documentation

## 2013-03-11 DIAGNOSIS — K2100 Gastro-esophageal reflux disease with esophagitis, without bleeding: Secondary | ICD-10-CM | POA: Insufficient documentation

## 2013-03-11 DIAGNOSIS — B91 Sequelae of poliomyelitis: Secondary | ICD-10-CM | POA: Insufficient documentation

## 2013-03-11 DIAGNOSIS — M810 Age-related osteoporosis without current pathological fracture: Secondary | ICD-10-CM | POA: Insufficient documentation

## 2013-03-11 DIAGNOSIS — M5430 Sciatica, unspecified side: Secondary | ICD-10-CM | POA: Insufficient documentation

## 2013-04-05 DIAGNOSIS — Z68.41 Body mass index (BMI) pediatric, 5th percentile to less than 85th percentile for age: Secondary | ICD-10-CM | POA: Insufficient documentation

## 2013-04-05 DIAGNOSIS — M353 Polymyalgia rheumatica: Secondary | ICD-10-CM | POA: Insufficient documentation

## 2013-04-05 DIAGNOSIS — Z789 Other specified health status: Secondary | ICD-10-CM | POA: Insufficient documentation

## 2013-04-05 DIAGNOSIS — I10 Essential (primary) hypertension: Secondary | ICD-10-CM | POA: Insufficient documentation

## 2013-04-05 DIAGNOSIS — E7800 Pure hypercholesterolemia, unspecified: Secondary | ICD-10-CM | POA: Insufficient documentation

## 2013-09-13 ENCOUNTER — Other Ambulatory Visit (HOSPITAL_BASED_OUTPATIENT_CLINIC_OR_DEPARTMENT_OTHER): Payer: Medicare PPO

## 2013-09-13 ENCOUNTER — Telehealth: Payer: Self-pay | Admitting: Internal Medicine

## 2013-09-13 ENCOUNTER — Encounter: Payer: Self-pay | Admitting: Internal Medicine

## 2013-09-13 ENCOUNTER — Ambulatory Visit (HOSPITAL_BASED_OUTPATIENT_CLINIC_OR_DEPARTMENT_OTHER): Payer: Medicare PPO | Admitting: Internal Medicine

## 2013-09-13 VITALS — BP 130/51 | HR 77 | Temp 98.0°F | Resp 18 | Ht 61.5 in | Wt 105.0 lb

## 2013-09-13 DIAGNOSIS — D472 Monoclonal gammopathy: Secondary | ICD-10-CM

## 2013-09-13 LAB — CBC WITH DIFFERENTIAL/PLATELET
BASO%: 0.4 % (ref 0.0–2.0)
BASOS ABS: 0 10*3/uL (ref 0.0–0.1)
EOS ABS: 0.1 10*3/uL (ref 0.0–0.5)
EOS%: 2.3 % (ref 0.0–7.0)
HEMATOCRIT: 42.9 % (ref 34.8–46.6)
HEMOGLOBIN: 13.7 g/dL (ref 11.6–15.9)
LYMPH%: 22.6 % (ref 14.0–49.7)
MCH: 29.1 pg (ref 25.1–34.0)
MCHC: 32 g/dL (ref 31.5–36.0)
MCV: 91 fL (ref 79.5–101.0)
MONO#: 0.5 10*3/uL (ref 0.1–0.9)
MONO%: 8.6 % (ref 0.0–14.0)
NEUT%: 66.1 % (ref 38.4–76.8)
NEUTROS ABS: 3.5 10*3/uL (ref 1.5–6.5)
Platelets: 239 10*3/uL (ref 145–400)
RBC: 4.72 10*6/uL (ref 3.70–5.45)
RDW: 13 % (ref 11.2–14.5)
WBC: 5.3 10*3/uL (ref 3.9–10.3)
lymph#: 1.2 10*3/uL (ref 0.9–3.3)

## 2013-09-13 LAB — COMPREHENSIVE METABOLIC PANEL (CC13)
ALBUMIN: 3.9 g/dL (ref 3.5–5.0)
ALK PHOS: 69 U/L (ref 40–150)
ALT: 12 U/L (ref 0–55)
AST: 17 U/L (ref 5–34)
Anion Gap: 8 mEq/L (ref 3–11)
BUN: 13.3 mg/dL (ref 7.0–26.0)
CALCIUM: 9.4 mg/dL (ref 8.4–10.4)
CO2: 28 mEq/L (ref 22–29)
CREATININE: 0.7 mg/dL (ref 0.6–1.1)
Chloride: 97 mEq/L — ABNORMAL LOW (ref 98–109)
GLUCOSE: 74 mg/dL (ref 70–140)
Potassium: 4.6 mEq/L (ref 3.5–5.1)
Sodium: 133 mEq/L — ABNORMAL LOW (ref 136–145)
Total Bilirubin: 0.58 mg/dL (ref 0.20–1.20)
Total Protein: 7.4 g/dL (ref 6.4–8.3)

## 2013-09-13 NOTE — Progress Notes (Signed)
[name]  [name], MD [address] [place] [address]  DIAGNOSIS: MGUS (monoclonal gammopathy of unknown significance) - Plan: CBC with Differential, Comprehensive metabolic panel (Cmet) - [facility], Lactate dehydrogenase (LDH) - [facility], IgG, IgA, IgM, Kappa/lambda light chains  Chief Complaint: IgM lambda monoclonal gammopathy  CURRENT THERAPY: Observation  INTERVAL HISTORY: Ellen Thomas 78 y.o. female with a history of poliomyelitis with residual left-sided weakness, Polymyalgia rheumatica (2009) and recently MGUS presents for a one-year followup.  She was last seen by 12/22/2012. Today, she reports doing well overall.  She denies any recent hospitalizations or emergency room visits.  She denies fever or chills or bone pain or urinary changes.  She has been off her prednisone over the past year for her polymyalgia rheumatica. She had the flu earlier this year with prolonged recovery period.  She still continues to recover.    MEDICAL HISTORY:History reviewed. No pertinent past medical history.  INTERIM HISTORY: has MGUS (monoclonal gammopathy of unknown significance) on her problem list.    ALLERGIES:  is allergic to tetracyclines & related.  MEDICATIONS: has a current medication list which includes the following prescription(s): biotin, cholecalciferol, rosuvastatin, and telmisartan.  SURGICAL HISTORY: History reviewed. No pertinent past surgical history.  REVIEW OF SYSTEMS:   Constitutional: Denies fevers, chills or abnormal weight loss Eyes: Denies blurriness of vision Ears, nose, mouth, throat, and face: Denies mucositis or sore throat Respiratory: Denies cough, dyspnea or wheezes Cardiovascular: Denies palpitation, chest discomfort or lower extremity swelling Gastrointestinal:  Denies nausea, heartburn or change in bowel habits Skin: Denies abnormal skin rashes Lymphatics: Denies new lymphadenopathy or easy  bruising Neurological:Denies numbness, tingling or new weaknesses Behavioral/Psych: Mood is stable, no new changes  All other systems were reviewed with the patient and are negative.  PHYSICAL EXAMINATION: ECOG PERFORMANCE STATUS: 0 - Asymptomatic  Blood pressure 130/51, pulse 77, temperature 98 F (36.7 C), temperature source Oral, resp. rate 18, height 5' 1.5" (1.562 m), weight 105 lb (47.628 kg).  GENERAL:alert, no distress and comfortable with left sided weakness and atrophy related to her polio.  SKIN: skin color, texture, turgor are normal, no rashes or significant lesions; hypopigmentation on upper extremities.  EYES: normal, Conjunctiva are pink and non-injected, sclera clear OROPHARYNX:no exudate, no erythema and lips, buccal mucosa, and tongue normal  NECK: supple, thyroid normal size, non-tender, without nodularity LYMPH:  no palpable lymphadenopathy in the cervical, axillary or inguinal LUNGS: clear to auscultation and percussion with normal breathing effort HEART: regular rate & rhythm and no murmurs and no lower extremity edema ABDOMEN:abdomen soft, non-tender and normal bowel sounds Musculoskeletal:no cyanosis of digits and no clubbing  NEURO: alert & oriented x 3 with fluent speech, no focal motor/sensory deficits   LABORATORY DATA: Results for orders placed in visit on 09/13/13 (from the past 48 hour(s))  CBC WITH DIFFERENTIAL     Status: None   Collection Time    09/13/13  1:07 PM      Result Value Ref Range   WBC 5.3  3.9 - 10.3 10e3/uL   NEUT# 3.5  1.5 - 6.5 10e3/uL   HGB 13.7  11.6 - 15.9 g/dL   HCT 42.9  34.8 - 46.6 %   Platelets 239  145 - 400 10e3/uL   MCV 91.0  79.5 - 101.0 fL   MCH 29.1  25.1 - 34.0 pg   MCHC 32.0  31.5 - 36.0 g/dL   RBC 4.72  3.70 - 5.45 10e6/uL  RDW 13.0  11.2 - 14.5 %   lymph# 1.2  0.9 - 3.3 10e3/uL   MONO# 0.5  0.1 - 0.9 10e3/uL   Eosinophils Absolute 0.1  0.0 - 0.5 10e3/uL   Basophils Absolute 0.0  0.0 - 0.1 10e3/uL   NEUT%  66.1  38.4 - 76.8 %   LYMPH% 22.6  14.0 - 49.7 %   MONO% 8.6  0.0 - 14.0 %   EOS% 2.3  0.0 - 7.0 %   BASO% 0.4  0.0 - 2.0 %  COMPREHENSIVE METABOLIC PANEL (WE31)     Status: Abnormal   Collection Time    09/13/13  1:07 PM      Result Value Ref Range   Sodium 133 (*) 136 - 145 mEq/L   Potassium 4.6  3.5 - 5.1 mEq/L   Chloride 97 (*) 98 - 109 mEq/L   CO2 28  22 - 29 mEq/L   Glucose 74  70 - 140 mg/dl   BUN 13.3  7.0 - 26.0 mg/dL   Creatinine 0.7  0.6 - 1.1 mg/dL   Total Bilirubin 0.58  0.20 - 1.20 mg/dL   Alkaline Phosphatase 69  40 - 150 U/L   AST 17  5 - 34 U/L   ALT 12  0 - 55 U/L   Total Protein 7.4  6.4 - 8.3 g/dL   Albumin 3.9  3.5 - 5.0 g/dL   Calcium 9.4  8.4 - 10.4 mg/dL   Anion Gap 8  3 - 11 mEq/L    Results for SONDRA, BLIXT (MRN [id number]) as of 09/13/2013 14:11  Ref. Range 12/22/2012 14:43  IgG (Immunoglobin G), Serum Latest Range: [phone] mg/dL 694  IgA Latest Range: 69-380 mg/dL 122  IgM, Serum Latest Range: 52-322 mg/dL 193    RADIOGRAPHIC STUDIES: No results found.  ASSESSMENT: IgM lambda monoclonal gammopathy, first detected in December 2009 with normal quantitative immunoglobulins, negative urine immunofixation electrophoresis, and a normal 24-hour urine protein.  PLAN:  --The patient remains with benign monoclonal gammopathy. We reviewed her lab data in detail.  We again explained that some patients with monoclonal gammopathy do have a malignant disease or may develop a malignant disease. We will check quantitative immunoglobulins, CBC and chemistries and she will return to the clinic in one year.    All questions were answered. The patient knows to call the clinic with any problems, questions or concerns. We can certainly see the patient much sooner if necessary.  I spent 10 minutes counseling the patient face to face. The total time spent in the appointment was 15 minutes.  Herberto Ledwell, MD 09/13/2013 2:10 PM

## 2013-09-13 NOTE — Telephone Encounter (Signed)
gv and printed appt sched and avs for pt for June 2016 °

## 2013-09-14 LAB — IGG, IGA, IGM
IGA: 138 mg/dL (ref 69–380)
IGG (IMMUNOGLOBIN G), SERUM: 822 mg/dL (ref 690–1700)
IGM, SERUM: 227 mg/dL (ref 52–322)

## 2013-09-21 ENCOUNTER — Ambulatory Visit: Payer: Medicare PPO

## 2014-02-13 DIAGNOSIS — Z0000 Encounter for general adult medical examination without abnormal findings: Secondary | ICD-10-CM | POA: Insufficient documentation

## 2014-03-10 DIAGNOSIS — J40 Bronchitis, not specified as acute or chronic: Secondary | ICD-10-CM | POA: Insufficient documentation

## 2014-07-15 ENCOUNTER — Telehealth: Payer: Self-pay | Admitting: Hematology and Oncology

## 2014-07-15 NOTE — Telephone Encounter (Signed)
Called patient and she is aware of her her new appointment with dr Alvy Bimler

## 2014-09-09 ENCOUNTER — Other Ambulatory Visit: Payer: Self-pay | Admitting: Hematology and Oncology

## 2014-09-09 DIAGNOSIS — D472 Monoclonal gammopathy: Secondary | ICD-10-CM

## 2014-09-12 ENCOUNTER — Ambulatory Visit: Payer: Medicare PPO | Admitting: Hematology and Oncology

## 2014-09-12 ENCOUNTER — Other Ambulatory Visit: Payer: Medicare PPO

## 2015-03-24 DIAGNOSIS — Z9181 History of falling: Secondary | ICD-10-CM | POA: Insufficient documentation

## 2015-04-04 ENCOUNTER — Encounter (HOSPITAL_COMMUNITY): Payer: Self-pay | Admitting: Emergency Medicine

## 2015-04-04 ENCOUNTER — Emergency Department (HOSPITAL_COMMUNITY): Payer: Medicare Other

## 2015-04-04 ENCOUNTER — Emergency Department (HOSPITAL_COMMUNITY)
Admission: EM | Admit: 2015-04-04 | Discharge: 2015-04-04 | Disposition: A | Discharge status: Home / Self Care | Payer: Medicare Other | Attending: Emergency Medicine | Admitting: Emergency Medicine

## 2015-04-04 DIAGNOSIS — Y92513 Shop (commercial) as the place of occurrence of the external cause: Secondary | ICD-10-CM | POA: Insufficient documentation

## 2015-04-04 DIAGNOSIS — Z79899 Other long term (current) drug therapy: Secondary | ICD-10-CM | POA: Insufficient documentation

## 2015-04-04 DIAGNOSIS — S40012A Contusion of left shoulder, initial encounter: Secondary | ICD-10-CM

## 2015-04-04 DIAGNOSIS — S4992XA Unspecified injury of left shoulder and upper arm, initial encounter: Secondary | ICD-10-CM | POA: Diagnosis present

## 2015-04-04 DIAGNOSIS — Y9389 Activity, other specified: Secondary | ICD-10-CM | POA: Diagnosis not present

## 2015-04-04 DIAGNOSIS — W01198A Fall on same level from slipping, tripping and stumbling with subsequent striking against other object, initial encounter: Secondary | ICD-10-CM | POA: Insufficient documentation

## 2015-04-04 DIAGNOSIS — Y998 Other external cause status: Secondary | ICD-10-CM | POA: Diagnosis not present

## 2015-04-04 MED ORDER — HYDROCODONE-ACETAMINOPHEN 5-325 MG PO TABS: 1.0000 | ORAL_TABLET | Freq: Once | ORAL | Status: DC

## 2015-04-04 MED ORDER — HYDROCODONE-ACETAMINOPHEN 5-325 MG PO TABS: ORAL_TABLET | ORAL | Status: DC

## 2015-04-04 NOTE — ED Notes (Addendum)
Patient presents from [employer] via EMS for fall to left shoulder. History of surgery to left shoulder. No obvious acute deformity, no anticoagulants, no neck or back pain, denies LOC. Denies numbness or tingling. Rates pain 4/10. A&O x4.

## 2015-04-04 NOTE — Discharge Instructions (Signed)
Take vicodin for breakthrough pain, do not drink alcohol, drive, care for children or do other critical tasks while taking vicodin. ° °Please be very careful not to fall! °The pain medication puts you at risk for falls. Please rest as much as possible and try to not stay alone.  ° °Please follow with your primary care doctor in the next 2 days for a check-up. They must obtain records for further management.  ° °Do not hesitate to return to the Emergency Department for any new, worsening or concerning symptoms.  ° ° °

## 2015-04-04 NOTE — ED Provider Notes (Signed)
CSN: [id number]     Arrival date & time 04/04/15  1333 History  By signing my name below, I, Essence Howell, attest that this documentation has been prepared under the direction and in the presence of Monico Blitz, PA-C Electronically Signed: Ladene Artist, ED Scribe 04/04/2015 at 4:34 PM.   Chief Complaint  Patient presents with  . Fall  . Shoulder Pain   The history is provided by the patient. No language interpreter was used.   HPI Comments: Ellen Thomas is a 80 y.o. female, brought in by EMS, who presents to the Emergency Department complaining of a fall that occurred PTA. Pt states that she fell at [employer] and struck her left shoulder on a shopping cart. No head injury or LOC. She reports constant, 3/10 pain at rest that is exacerbated with movement and squeezing her left hand. She denies neck pain and imbalance prior to fall. Pt reports h/o polio as a child which resulted in neck stiffness and limited mobility in her left shoulder. She reports h/o surgery to her left shoulder 50 years ago. Pt is followed by Maia Breslow at [facility] who has recently retired. Pt receives a prolia injection every 6 months for osteoporosis; she is due for another injection next week.  History reviewed. No pertinent past medical history. History reviewed. No pertinent past surgical history. No family history on file. Social History  Substance Use Topics  . Smoking status: Never Smoker   . Smokeless tobacco: None  . Alcohol Use: None   OB History    No data available     Review of Systems  A complete 10 system review of systems was obtained and all systems are negative except as noted in the HPI and PMH.  Allergies  Tetracyclines & related  Home Medications   Prior to Admission medications   Medication Sig Start Date End Date Taking? Authorizing Provider  acetaminophen (TYLENOL) 500 MG tablet Take 500 mg by mouth every 6 (six) hours as needed for moderate pain or headache.   Yes  Historical Provider, MD  atorvastatin (LIPITOR) 10 MG tablet Take 10 mg by mouth daily. 02/27/15  Yes Historical Provider, MD  Biotin 2500 MCG CAPS Take by mouth.   Yes Historical Provider, MD  calcium carbonate (TUMS - DOSED IN MG ELEMENTAL CALCIUM) 500 MG chewable tablet Chew 1 tablet by mouth daily as needed for indigestion or heartburn.   Yes Historical Provider, MD  Chlorpheniramine Maleate (CHLOR-TRIMETON ALLERGY PO) Take 0.5 tablets by mouth daily as needed (cold symptoms).   Yes Historical Provider, MD  cholecalciferol (VITAMIN D) 1000 UNITS tablet Take 1,000 Units by mouth daily.   Yes Historical Provider, MD  conjugated estrogens (PREMARIN) vaginal cream Place 1 Applicatorful vaginally as needed. 01/25/15  Yes Historical Provider, MD  guaiFENesin-codeine (ROBITUSSIN AC) 100-10 MG/5ML syrup Take 5 mLs by mouth 3 (three) times daily as needed for cough.  03/24/15  Yes Historical Provider, MD  Multiple Vitamin (MULTIVITAMIN) capsule Take 1 capsule by mouth daily.   Yes Historical Provider, MD  omeprazole (PRILOSEC OTC) 20 MG tablet Take 20 mg by mouth daily as needed (heartburn).   Yes Historical Provider, MD  Polyethyl Glycol-Propyl Glycol (SYSTANE OP) Apply 1 drop to eye daily as needed (dry eyes).   Yes Historical Provider, MD  telmisartan (MICARDIS) 20 MG tablet Take 20 mg by mouth daily.   Yes Historical Provider, MD   BP 171/71 mmHg  Pulse 78  Temp(Src) 97.6 F (36.4 C) (Oral)  SpO2 97% Physical Exam  Constitutional: She is oriented to person, place, and time. She appears well-developed and well-nourished. No distress.  HENT:  Head: Normocephalic and atraumatic.  Eyes: Conjunctivae and EOM are normal.  Neck: Neck supple. No tracheal deviation present.  Cardiovascular: Normal rate.   Pulmonary/Chest: Effort normal. No respiratory distress.  Musculoskeletal: Normal range of motion. She exhibits tenderness.  Chronic deformity to left shoulder with significantly reduced motion in  abduction. She distally neurovascularly intact, tender to palpation along the biceps. No bony tenderness along the olecranon wrist, snuff box or hand.  Neurological: She is alert and oriented to person, place, and time.  Skin: Skin is warm and dry.  Psychiatric: She has a normal mood and affect. Her behavior is normal.  Nursing note and vitals reviewed.  ED Course  Procedures (including critical care time) DIAGNOSTIC STUDIES: Oxygen Saturation is 97% on RA, normal by my interpretation.    COORDINATION OF CARE: 4:21 PM-Discussed treatment plan which includes XR and Norco with pt at bedside and pt agreed to plan.   Labs Review Labs Reviewed - No data to display  Imaging Review Dg Shoulder Left  04/04/2015  CLINICAL DATA:  Patient fell today.  Pain. EXAM: LEFT SHOULDER - 2+ VIEW COMPARISON:  None. FINDINGS: There is fusion across the glenohumeral joint. There is a deformed humeral head. There is a history of surgery in the remote past. No acute fracture is seen. IMPRESSION: Glenohumeral joint fusion.  No acute fractures seen. Electronically Signed   By: Staci Righter M.D.   On: 04/04/2015 14:25   I have personally reviewed and evaluated these images and lab results as part of my medical decision-making.   EKG Interpretation None      MDM   Final diagnoses:  Shoulder contusion, left, initial encounter    Filed Vitals:   04/04/15 1354 04/04/15 1622  BP: 171/71 183/88  Pulse: 78 84  Temp: 97.6 F (36.4 C) 97.6 F (36.4 C)  TempSrc: Oral Oral  Resp:  18  SpO2: 97% 100%    Medications  HYDROcodone-acetaminophen (NORCO/VICODIN) 5-325 MG per tablet 1 tablet (not administered)    [employer]RE[employer]TER is 80 y.o. female presenting with pain to left shoulder status post slip and fall, no other trauma. Patient with significantly reduced range of motion however think a lot of this is chronic. X-rays negative for acute fracture. She is tender along the biceps, there may be a rotator  cuff injury as well, patient is given a sling and asked to follow closely with orthopedist.  This is a shared visit with the attending physician who personally evaluated the patient and agrees with the care plan.   Evaluation does not show pathology that would require ongoing emergent intervention or inpatient treatment. Pt is hemodynamically stable and mentating appropriately. Discussed findings and plan with patient/guardian, who agrees with care plan. All questions answered. Return precautions discussed and outpatient follow up given.   New Prescriptions   HYDROCODONE-ACETAMINOPHEN (NORCO/VICODIN) 5-325 MG TABLET    Take 1-2 tablets by mouth every 6 hours as needed for pain and/or cough.        Monico Blitz, PA-C 04/04/15 1726

## 2015-04-04 NOTE — ED Provider Notes (Signed)
Medical screening examination/treatment/procedure(s) were conducted as a shared visit with non-physician practitioner(s) and myself.  I personally evaluated the patient during the encounter.   EKG Interpretation None      Results for orders placed or performed in visit on 09/13/13  CBC with Differential  Result Value Ref Range   WBC 5.3 3.9 - 10.3 10e3/uL   NEUT# 3.5 1.5 - 6.5 10e3/uL   HGB 13.7 11.6 - 15.9 g/dL   HCT 42.9 34.8 - 46.6 %   Platelets 239 145 - 400 10e3/uL   MCV 91.0 79.5 - 101.0 fL   MCH 29.1 25.1 - 34.0 pg   MCHC 32.0 31.5 - 36.0 g/dL   RBC 4.72 3.70 - 5.45 10e6/uL   RDW 13.0 11.2 - 14.5 %   lymph# 1.2 0.9 - 3.3 10e3/uL   MONO# 0.5 0.1 - 0.9 10e3/uL   Eosinophils Absolute 0.1 0.0 - 0.5 10e3/uL   Basophils Absolute 0.0 0.0 - 0.1 10e3/uL   NEUT% 66.1 38.4 - 76.8 %   LYMPH% 22.6 14.0 - 49.7 %   MONO% 8.6 0.0 - 14.0 %   EOS% 2.3 0.0 - 7.0 %   BASO% 0.4 0.0 - 2.0 %  Comprehensive metabolic panel  Result Value Ref Range   Sodium 133 (L) 136 - 145 mEq/L   Potassium 4.6 3.5 - 5.1 mEq/L   Chloride 97 (L) 98 - 109 mEq/L   CO2 28 22 - 29 mEq/L   Glucose 74 70 - 140 mg/dl   BUN 13.3 7.0 - 26.0 mg/dL   Creatinine 0.7 0.6 - 1.1 mg/dL   Total Bilirubin 0.58 0.20 - 1.20 mg/dL   Alkaline Phosphatase 69 40 - 150 U/L   AST 17 5 - 34 U/L   ALT 12 0 - 55 U/L   Total Protein 7.4 6.4 - 8.3 g/dL   Albumin 3.9 3.5 - 5.0 g/dL   Calcium 9.4 8.4 - 10.4 mg/dL   Anion Gap 8 3 - 11 mEq/L  IgG, IgA, IgM  Result Value Ref Range   IgG (Immunoglobin G), Serum 822 690 - 1700 mg/dL   IgA 138 69 - 380 mg/dL   IgM, Serum 227 52 - 322 mg/dL   Dg Shoulder Left  04/04/2015  CLINICAL DATA:  Patient fell today.  Pain. EXAM: LEFT SHOULDER - 2+ VIEW COMPARISON:  None. FINDINGS: There is fusion across the glenohumeral joint. There is a deformed humeral head. There is a history of surgery in the remote past. No acute fracture is seen. IMPRESSION: Glenohumeral joint fusion.  No acute fractures  seen. Electronically Signed   By: Staci Righter M.D.   On: 04/04/2015 14:25    [place] [employer] fell on [place] shoulder arm was tucked in against her body so unlikely that there will be a rotator cuff injury. Patient's pain with range of motion of that arm may be secondary to contusion to the deltoid muscle. X-ray showed no evidence of any acute fracture. Patient has chronic deformity due to polio at a younger age. Patient is followed by [facility] orthopedics and can follow-up with them. We'll place in a shoulder immobilizer. Will be treated with pain medicine. No other injuries from the fall. Patient's radial pulse to the left arm is 2+. Sensations intact. No tenderness at the wrist hand or elbow.  Fredia Sorrow, MD 04/04/15 [phone]

## 2015-07-20 ENCOUNTER — Encounter: Payer: Self-pay | Admitting: Podiatry

## 2015-07-20 ENCOUNTER — Ambulatory Visit (INDEPENDENT_AMBULATORY_CARE_PROVIDER_SITE_OTHER): Payer: Medicare Other | Admitting: Podiatry

## 2015-07-20 VITALS — BP 164/86 | HR 88 | Resp 16

## 2015-07-20 DIAGNOSIS — L03031 Cellulitis of right toe: Secondary | ICD-10-CM | POA: Diagnosis not present

## 2015-07-20 DIAGNOSIS — L03011 Cellulitis of right finger: Secondary | ICD-10-CM

## 2015-07-20 MED ORDER — CLINDAMYCIN HCL 150 MG PO CAPS: 150.0000 mg | ORAL_CAPSULE | Freq: Three times a day (TID) | ORAL | Status: DC

## 2015-07-20 MED ORDER — NEOMYCIN-POLYMYXIN-HC 1 % OT SOLN: OTIC | Status: DC

## 2015-07-20 NOTE — Patient Instructions (Addendum)

## 2015-07-20 NOTE — Progress Notes (Signed)
   Subjective:    Patient ID: Ellen Thomas, female    DOB: 1935/09/06, 80 y.o.   MRN: [id number]  HPI: Presents today with chief complaint of a tender red swollen great toe hallux right. States this than likely her for the past few days has tried soaking in Epsom salts and warm water the end of the nail seems to be loose but is painful in this corner and she points to the proximal medial nail fold. Denies any trauma.    Review of Systems  Eyes: Positive for itching.  Endocrine: Positive for cold intolerance.  All other systems reviewed and are negative.      Objective:   Physical Exam: Vital signs stable alert and oriented 3. Pulses are strongly palpable. Neurologic sensorium is intact. Deep tendon reflexes are intact. Muscle strength is normal. Hallux right does demonstrate mild cellulitis along the proximal nail fold with distal onycholysis and malodor. Nail plate once removed today does demonstrate a very small amount of purulence to the proximal medial nail fold no foreign bodies are identified.        Assessment & Plan:  Assessment subungual paronychia/subungual abscess proximal knee medial nail fold hallux right.  Plan: Total nail avulsion with an incision and drainage of abscess today we cleaned of all necrotic tissue after local anesthesia was administered to the hallux right. She tolerated this procedure well. There was not enough purulence to sample today but we did start her on clindamycin and I will follow-up with her in 1 week. She was given both oral and written home and going instructions for the care and soaking of her toes and I will follow-up with her in 1 week. Cortisporin was also prescribed.

## 2015-07-27 ENCOUNTER — Telehealth: Payer: Self-pay | Admitting: *Deleted

## 2015-07-27 MED ORDER — SULFAMETHOXAZOLE-TRIMETHOPRIM 800-160 MG PO TABS: 1.0000 | ORAL_TABLET | Freq: Two times a day (BID) | ORAL | Status: DC

## 2015-07-27 NOTE — Telephone Encounter (Addendum)
Pt states she was prescribed Clindamycin one week ago, and it is causing stomach upset and nausea, is there another medication she can take.  Dr. Milinda Pointer ordered Bactrim DS and I informed pt of the change of orders and to stop the clindamycin.  Pt states understanding.

## 2015-07-27 NOTE — Telephone Encounter (Signed)
Bactrim DS one po bid times ten days. Dc clinda.

## 2015-08-01 ENCOUNTER — Ambulatory Visit (INDEPENDENT_AMBULATORY_CARE_PROVIDER_SITE_OTHER): Payer: Medicare Other | Admitting: Podiatry

## 2015-08-01 ENCOUNTER — Encounter: Payer: Self-pay | Admitting: Podiatry

## 2015-08-01 VITALS — BP 140/90 | HR 82 | Resp 12

## 2015-08-01 DIAGNOSIS — L03011 Cellulitis of right finger: Secondary | ICD-10-CM

## 2015-08-01 DIAGNOSIS — L603 Nail dystrophy: Secondary | ICD-10-CM

## 2015-08-01 DIAGNOSIS — L03031 Cellulitis of right toe: Secondary | ICD-10-CM | POA: Diagnosis not present

## 2015-08-01 NOTE — Patient Instructions (Signed)

## 2015-08-01 NOTE — Progress Notes (Signed)
She presents today for follow-up of her nail avulsion hallux right. She also would like to have her toenails cut. She states that she continues to soak the toe on a regular basis.  Objective: Vital signs are stable alert and oriented 3. No erythema edema saline as drainage or odor to the hallux nail right. Otherwise nails are thick yellow dystrophic with mycotic and painful on palpation as well as debridement.  Assessment: Pain in limb secondary to onychomycosis sharp innervated nail margins to the fibular borders of the bilateral toes. Onychomycosis is also noted. Well healing surgical toe hallux right.  Plan: Discontinue Betadine sterile Epsom salts and warm water soaks covered in the daytime and leave open at bedtime. Continue soaking to completely resolved. Debrided toenails 1 through 5 bilateral as a covered service.

## 2015-08-08 ENCOUNTER — Telehealth: Payer: Self-pay | Admitting: *Deleted

## 2015-08-08 NOTE — Telephone Encounter (Addendum)
Pt asked if she had to complete the 2nd medication Bactrim DS she has over 15 more pills and they're difficult to swallow if she doesn't quarter them, but her toe looks good.  I told pt I would check with Dr. Milinda Pointer but he may want her to complete the therapy to make sure the bacteria was killed in the toe not just dormant.  08/10/2015-Left message informing pt, Dr. Milinda Pointer wanted her to completed the antibiotic.

## 2015-08-09 NOTE — Telephone Encounter (Signed)
Always try to finish the abx

## 2015-08-22 ENCOUNTER — Telehealth: Payer: Self-pay | Admitting: *Deleted

## 2015-08-22 NOTE — Telephone Encounter (Signed)
Pt states she had a toenail removed and right 1st toenail, by Dr. Milinda Pointer on 07/20/2015.  Pt states the toenail if fine has not scabbing, redness, drainage. I told pt it would be fine to go to water aerobics, but for the 1st week or so to perform epsom salt soak after the pool.  Pt states understanding.

## 2015-11-16 ENCOUNTER — Ambulatory Visit (INDEPENDENT_AMBULATORY_CARE_PROVIDER_SITE_OTHER): Payer: Medicare Other | Admitting: Podiatry

## 2015-11-16 ENCOUNTER — Encounter: Payer: Self-pay | Admitting: Podiatry

## 2015-11-16 DIAGNOSIS — L923 Foreign body granuloma of the skin and subcutaneous tissue: Secondary | ICD-10-CM | POA: Insufficient documentation

## 2015-11-16 NOTE — Progress Notes (Signed)
She presents today with chief complaint of a painful foreign body to the hallux right. She states the nail and the plantar aspect of the foot has been painful for the past few days. He was really swollen but has now subsided.  Objective: Vital signs are stable alert and oriented 3 no erythema edema saline as drainage or odor there is a small painful to the plantar aspect of the right hallux. I debrided this area today and removed a small foreign body area  Assessment: Retention foreign body skin hallux right.  Plan: Remove this foreign body today and will follow up with her on an as-needed basis.

## 2016-04-02 ENCOUNTER — Encounter: Payer: Self-pay | Admitting: Sports Medicine

## 2016-04-02 ENCOUNTER — Ambulatory Visit (INDEPENDENT_AMBULATORY_CARE_PROVIDER_SITE_OTHER): Payer: Medicare Other | Admitting: Sports Medicine

## 2016-04-02 DIAGNOSIS — S90424A Blister (nonthermal), right lesser toe(s), initial encounter: Secondary | ICD-10-CM | POA: Diagnosis not present

## 2016-04-02 DIAGNOSIS — M79674 Pain in right toe(s): Secondary | ICD-10-CM

## 2016-04-02 MED ORDER — NEOMYCIN-POLYMYXIN-HC 1 % OT SOLN: OTIC | 1 refills | Status: DC

## 2016-04-02 NOTE — Progress Notes (Signed)
Subjective: Ellen Thomas is a 81 y.o. female patient seen today in office with complaint of painful skin in between 3-4 toes on right foot that started as a itch then with skin that came off over the weekend. States that she has not had a pedicure in a while and havent have her nails or callus cared for. Patient has no other pedal complaints at this time.   Admits to history of polio with foot deformity  Patient Active Problem List   Diagnosis Date Noted  . Foreign body granuloma of skin and subcutaneous tissue 11/16/2015  . At risk for falling 03/24/2015  . Bronchitis 03/10/2014  . Encounter for general adult medical examination without abnormal findings 02/13/2014  . BMI (body mass index), pediatric, 5% to less than 85% for age 58/07/2013  . Hypercholesterolemia 04/05/2013  . Benign hypertension 04/05/2013  . Polymyalgia rheumatica ([place]) 04/05/2013  . Other specified health status 04/05/2013  . Disease of skin and subcutaneous tissue 03/11/2013  . OP (osteoporosis) 03/11/2013  . 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 Esophagitis, reflux 03/11/2013  . Neuralgia neuritis, sciatic nerve 03/11/2013  . Encounter for screening for eye and ear disorders 03/11/2013  . MGUS (monoclonal gammopathy of unknown significance) 11/01/2011    Current Outpatient Prescriptions on File Prior to Visit  Medication Sig Dispense Refill  . acetaminophen (TYLENOL) 500 MG tablet Take 500 mg by mouth every 6 (six) hours as needed for moderate pain or headache.    Marland Kitchen atorvastatin (LIPITOR) 10 MG tablet Take 10 mg by mouth daily.  0  . Biotin 2500 MCG CAPS Take by mouth.    . calcium carbonate (TUMS - DOSED IN MG ELEMENTAL CALCIUM) 500 MG chewable tablet Chew 1 tablet by mouth daily as needed for indigestion or heartburn.    . Chlorpheniramine Maleate (CHLOR-TRIMETON ALLERGY PO) Take 0.5 tablets by mouth daily as needed (cold symptoms).    . cholecalciferol (VITAMIN D) 1000 UNITS tablet  Take 1,000 Units by mouth daily.    Marland Kitchen conjugated estrogens (PREMARIN) vaginal cream Place 1 Applicatorful vaginally as needed.    Marland Kitchen guaiFENesin-codeine (ROBITUSSIN AC) 100-10 MG/5ML syrup Take 5 mLs by mouth 3 (three) times daily as needed for cough.     Marland Kitchen HYDROcodone-acetaminophen (NORCO/VICODIN) 5-325 MG tablet Take 1-2 tablets by mouth every 6 hours as needed for pain and/or cough. 23 tablet 0  . Multiple Vitamin (MULTIVITAMIN) capsule Take 1 capsule by mouth daily.    Marland Kitchen omeprazole (PRILOSEC OTC) 20 MG tablet Take 20 mg by mouth daily as needed (heartburn).    Vladimir Faster Glycol-Propyl Glycol (SYSTANE OP) Apply 1 drop to eye daily as needed (dry eyes).    . PROLIA 60 MG/ML SOLN injection     . sulfamethoxazole-trimethoprim (BACTRIM DS,SEPTRA DS) 800-160 MG tablet Take 1 tablet by mouth 2 (two) times daily. 30 tablet 0  . telmisartan (MICARDIS) 20 MG tablet Take 20 mg by mouth daily.     No current facility-administered medications on file prior to visit.     Allergies  Allergen Reactions  . Other   . Tetracyclines & Related     Mouth sores    Objective: Physical Exam  General: Well developed, nourished, no acute distress, awake, alert and oriented x 3  Vascular: Dorsalis pedis artery 1/4 bilateral, Posterior tibial artery 1/4 bilateral, skin temperature warm to warm proximal to distal bilateral lower extremities, no varicosities, no pedal hair present bilateral.  Neurological: Gross sensation present via light touch bilateral.  Dermatological: Skin is warm, dry, and supple bilateral, Nails 1-10 are short and mildly dystrophic, + webspace maceration with laceration present right 3rd webspace, no open lesions present bilateral, + callus/corns/hyperkeratotic tissue present sub met 1,3,5 on left. No signs of infection bilateral.  Musculoskeletal: + foot deformity with digital dislocation secondary to polio bilateral. Muscular strength 4/5 without painon range of motion. No pain with  calf compression bilateral.  Assessment and Plan:  Problem List Items Addressed This Visit    None    Visit Diagnoses    Blister (nonthermal), right lesser toe(s), initial encounter    -  Primary   Pain of toe of right foot          -Examined patient.  -Discussed treatment options for blister in between toes. -Mechanically debrided loose skin interdigitally, rx corticosporin drops and recommend 2x daily epsom salt soaks and to try well in between toes until resolved -Patient to return as needed for follow up evaluation or sooner if symptoms worsen.  Landis Martins, DPM

## 2016-04-19 ENCOUNTER — Other Ambulatory Visit: Payer: Self-pay | Admitting: Radiology

## 2016-04-19 NOTE — Telephone Encounter (Signed)
Patients Prolia has arrived. She has appt on Jan 24th for follow up, we will need to check her CMP first, I have called her to advise. She states she did have labs with her PCP and will bring the labs in with her for the appointment.

## 2016-04-23 DIAGNOSIS — Z8781 Personal history of (healed) traumatic fracture: Secondary | ICD-10-CM | POA: Insufficient documentation

## 2016-04-23 DIAGNOSIS — Z8612 Personal history of poliomyelitis: Secondary | ICD-10-CM | POA: Insufficient documentation

## 2016-04-23 DIAGNOSIS — R768 Other specified abnormal immunological findings in serum: Secondary | ICD-10-CM | POA: Insufficient documentation

## 2016-04-23 DIAGNOSIS — R7689 Other specified abnormal immunological findings in serum: Secondary | ICD-10-CM | POA: Insufficient documentation

## 2016-04-23 DIAGNOSIS — M41125 Adolescent idiopathic scoliosis, thoracolumbar region: Secondary | ICD-10-CM | POA: Insufficient documentation

## 2016-04-23 DIAGNOSIS — L80 Vitiligo: Secondary | ICD-10-CM | POA: Insufficient documentation

## 2016-04-23 DIAGNOSIS — Z8719 Personal history of other diseases of the digestive system: Secondary | ICD-10-CM | POA: Insufficient documentation

## 2016-04-23 DIAGNOSIS — E785 Hyperlipidemia, unspecified: Secondary | ICD-10-CM | POA: Insufficient documentation

## 2016-04-23 DIAGNOSIS — I10 Essential (primary) hypertension: Secondary | ICD-10-CM | POA: Insufficient documentation

## 2016-04-23 NOTE — Progress Notes (Signed)
Office Visit Note  Patient: Ellen Thomas             Date of Birth: 1936/02/01           MRN: [id number]             PCP: [name], MD Referring: Bernerd Limbo, MD Visit Date: 04/24/2016 Occupation: @GUAROCC @    Subjective:  Osteoporosis fu, Right leg pain.   History of Present Illness: Ellen Thomas is a 81 y.o. female with history of osteoporosis and polymyalgia rheumatica. She states that her next probably injection is due in March. She's been having some discomfort in her right hamstring. She has had problems with the lower back and sciatica in the past but this is different. She denies any increased muscle weakness or tenderness.  Activities of Daily Living:  Patient reports morning stiffness for 0 minute.   Patient Denies nocturnal pain.  Difficulty dressing/grooming: Denies Difficulty climbing stairs: Denies Difficulty getting out of chair: Denies Difficulty using hands for taps, buttons, cutlery, and/or writing: Denies   Review of Systems  Constitutional: Negative for fatigue, night sweats, weight gain, weight loss and weakness.  HENT: Negative for mouth sores, trouble swallowing, trouble swallowing, mouth dryness and nose dryness.   Eyes: Positive for dryness. Negative for pain, redness and visual disturbance.  Respiratory: Negative for cough, shortness of breath and difficulty breathing.   Cardiovascular: Negative for chest pain, palpitations, hypertension, irregular heartbeat and swelling in legs/feet.  Gastrointestinal: Negative for blood in stool, constipation and diarrhea.  Endocrine: Negative for increased urination.  Genitourinary: Negative for vaginal dryness.  Musculoskeletal: Positive for myalgias and myalgias. Negative for arthralgias, joint pain, joint swelling, muscle weakness, morning stiffness and muscle tenderness.  Skin: Positive for color change. Negative for rash, hair loss, skin tightness, ulcers and sensitivity to sunlight.    Allergic/Immunologic: Negative for susceptible to infections.  Neurological: Negative for dizziness, memory loss and night sweats.  Hematological: Negative for swollen glands.  Psychiatric/Behavioral: Negative for depressed mood and sleep disturbance. The patient is not nervous/anxious.     PMFS History:  Patient Active Problem List   Diagnosis Date Noted  . History of humerus fracture 04/23/2016  . History of gastroesophageal reflux (GERD) 04/23/2016  . Positive ANA (antinuclear antibody) 04/23/2016  . History of poliomyelitis 04/23/2016  . Vitiligo 04/23/2016  . Scoliosis 04/23/2016  . Foreign body granuloma of skin and subcutaneous tissue 11/16/2015  . At risk for falling 03/24/2015  . Bronchitis 03/10/2014  . Encounter for general adult medical examination without abnormal findings 02/13/2014  . BMI (body mass index), pediatric, 5% to less than 85% for age 65/07/2013  . Hypercholesterolemia 04/05/2013  . Benign hypertension 04/05/2013  . Polymyalgia rheumatica ([place]) 04/05/2013  . Other specified health status 04/05/2013  . Disease of skin and subcutaneous tissue 03/11/2013  . OP (osteoporosis) 03/11/2013  . 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 Esophagitis, reflux 03/11/2013  . Neuralgia neuritis, sciatic nerve 03/11/2013  . Encounter for screening for eye and ear disorders 03/11/2013  . MGUS (monoclonal gammopathy of unknown significance) 11/01/2011    Past Medical History:  Diagnosis Date  . High cholesterol   . Hypertension   . Kyphosis   . PMR (polymyalgia rheumatica) ([facility])   . Polio   . Scoliosis   . Vitiligo     Family History  Problem Relation Age of Onset  . Diabetes Mother    Past Surgical History:  Procedure Laterality Date  . ABDOMINAL HYSTERECTOMY  [place] Left    took a piece of hip and fused it into left shoulder   Social History   Social History Narrative  . No narrative on file      Objective: Vital Signs: BP 129/65 (BP Location: Right Arm, Patient Position: Sitting, Cuff Size: Large)   Pulse 80   Resp 12   Ht 5' 1.5" (1.562 m)   Wt 104 lb (47.2 kg)   BMI 19.33 kg/m    Physical Exam  Constitutional: She is oriented to person, place, and time. She appears well-developed and well-nourished.  HENT:  Head: Normocephalic and atraumatic.  Eyes: Conjunctivae and EOM are normal.  Neck: Normal range of motion.  Cardiovascular: Normal rate, regular rhythm, normal heart sounds and intact distal pulses.   Pulmonary/Chest: Effort normal and breath sounds normal.  Abdominal: Soft. Bowel sounds are normal.  Lymphadenopathy:    She has no cervical adenopathy.  Neurological: She is alert and oriented to person, place, and time.  Skin: Skin is warm and dry. Capillary refill takes less than 2 seconds.  Psychiatric: She has a normal mood and affect. Her behavior is normal.  Nursing note and vitals reviewed.    Musculoskeletal Exam: C-spine limited range of motion lumbar spine limited range of motion. She had good range of motion in her elbows and wrists joints. Shoulder joints are limited range of motion. She had PIP/DIP thickening bilaterally consistent with osteoarthritis with no synovitis. Hip joints knee joints ankles were good range of motion. She has bilateral hammertoes. She has some tenderness over the hamstring area appears to be muscle spasm. No muscular weakness or tenderness was noted. 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  CDAI Exam: No CDAI exam completed.    Investigation: Findings:   PMR.  In remission.  No flare since 2014.  History of osteoporosis.  Will continue Prolia.  The patient will call and order the Prolia and then will make an appointment with our office to get an injection by a nurse.  DEXA done fall/winter of 2016/ Osteopenia      Imaging: No results found.  Speciality Comments: No specialty comments  available.    Procedures:  No procedures performed Allergies: Other and Tetracyclines & related   Assessment / Plan:     Visit Diagnoses: Age-related osteoporosis without current pathological fracture - on Prolia. Her next polio is due in March when she will come for the injection.  History of left humerus fracture - After fall 2016: Recovering well  Scoliosis - And kyphosis discussed the need for to do regular exercises  Polymyalgia rheumatica  - In remission since 2014  Positive ANA (antinuclear antibody) - Positive SSA: She has mild sicca symptoms for which she uses over-the-counter products.  MGUS (monoclonal gammopathy of unknown significance): Followed up by oncology.  She has some osteoarthritic changes in her hands and feet for which joint protection and muscle strengthening was discussed.  Her other medical problems are listed as follows.  Gastroesophageal reflux  History of poliomyelitis  Essential hypertension  Dyslipidemia  Vitiligo    Orders: No orders of the defined types were placed in this encounter.  No orders of the defined types were placed in this encounter.   Face-to-face time spent with patient was 25 minutes. 50% of time was spent in counseling and coordination of care.  Follow-Up Instructions: Return in about 2 months (around 06/22/2016) for Osteoarthritis, Osteoporosis.   Bo Merino, MD  Note -  This record has been created using [employer].  Chart creation errors have been sought, but may not always  have been located. Such creation errors do not reflect on  the standard of medical care.

## 2016-04-24 ENCOUNTER — Encounter: Payer: Self-pay | Admitting: Rheumatology

## 2016-04-24 ENCOUNTER — Ambulatory Visit (INDEPENDENT_AMBULATORY_CARE_PROVIDER_SITE_OTHER): Payer: Medicare Other | Admitting: Rheumatology

## 2016-04-24 VITALS — BP 129/65 | HR 80 | Resp 12 | Ht 61.5 in | Wt 104.0 lb

## 2016-04-24 DIAGNOSIS — M810 Age-related osteoporosis without current pathological fracture: Secondary | ICD-10-CM | POA: Diagnosis not present

## 2016-04-24 DIAGNOSIS — M353 Polymyalgia rheumatica: Secondary | ICD-10-CM

## 2016-04-24 DIAGNOSIS — Z8719 Personal history of other diseases of the digestive system: Secondary | ICD-10-CM

## 2016-04-24 DIAGNOSIS — M41125 Adolescent idiopathic scoliosis, thoracolumbar region: Secondary | ICD-10-CM

## 2016-04-24 DIAGNOSIS — I10 Essential (primary) hypertension: Secondary | ICD-10-CM

## 2016-04-24 DIAGNOSIS — R768 Other specified abnormal immunological findings in serum: Secondary | ICD-10-CM

## 2016-04-24 DIAGNOSIS — Z8781 Personal history of (healed) traumatic fracture: Secondary | ICD-10-CM | POA: Diagnosis not present

## 2016-04-24 DIAGNOSIS — E785 Hyperlipidemia, unspecified: Secondary | ICD-10-CM

## 2016-04-24 DIAGNOSIS — L80 Vitiligo: Secondary | ICD-10-CM

## 2016-04-24 DIAGNOSIS — D472 Monoclonal gammopathy: Secondary | ICD-10-CM

## 2016-04-24 DIAGNOSIS — M19042 Primary osteoarthritis, left hand: Secondary | ICD-10-CM

## 2016-04-24 DIAGNOSIS — Z8612 Personal history of poliomyelitis: Secondary | ICD-10-CM

## 2016-04-24 DIAGNOSIS — M19041 Primary osteoarthritis, right hand: Secondary | ICD-10-CM

## 2016-04-24 DIAGNOSIS — M19071 Primary osteoarthritis, right ankle and foot: Secondary | ICD-10-CM

## 2016-04-24 DIAGNOSIS — M19072 Primary osteoarthritis, left ankle and foot: Secondary | ICD-10-CM

## 2016-04-24 NOTE — Progress Notes (Signed)
Pharmacy Note:  Patient is currently taking Prolia 60 mg every 6 months for osteoporosis.  Her most recent Prolia injection was on 12/15/15.  I do not see that she got labs 10 days after Prolia.  I see CMP from [facility] on 02/12/16 at which time calcium was normal and GFR was > 60.  She recently had her Prolia prescription filled and the medication was delivered to our clinic.  I informed patient that we are storing the medication in the refrigerator.  She will be due for her next injection after 06/13/16.  Advised patient to schedule a nursing visit after 06/13/16 for her injection.  Also counseled patient that she will need labs 10 days after her injection.  She voiced understanding.    Reviewed patient's medication list with her and noted that she is not currently taking calcium.  Advised patient that calcium 1200 mg daily is recommended in patients with osteoporosis either through diet or supplements.  Reviewed dietary sources of calcium with patient and patient confirms she gets approximately 600 mg of calcium through diet.  Reviewed calcium supplements with patient including calcium minis that would be easier to swallow for patient.  Patient voiced understanding.    Elisabeth Most, Pharm.D., BCPS, CPP Clinical Pharmacist Pager: [phone] Phone: [phone] 04/24/2016 12:50 PM

## 2016-05-21 ENCOUNTER — Encounter: Payer: Self-pay | Admitting: Podiatry

## 2016-05-21 ENCOUNTER — Ambulatory Visit (INDEPENDENT_AMBULATORY_CARE_PROVIDER_SITE_OTHER): Payer: Medicare Other | Admitting: Podiatry

## 2016-05-21 DIAGNOSIS — B351 Tinea unguium: Secondary | ICD-10-CM | POA: Diagnosis not present

## 2016-05-21 DIAGNOSIS — S90424A Blister (nonthermal), right lesser toe(s), initial encounter: Secondary | ICD-10-CM

## 2016-05-21 DIAGNOSIS — M79676 Pain in unspecified toe(s): Secondary | ICD-10-CM | POA: Diagnosis not present

## 2016-05-22 NOTE — Progress Notes (Signed)
She presents today regarding a blister to the fourth digit of the right foot. States that he had when away from her last visit and it is now returned. She states Y keeps happening. States that the lesion had cleared 100% before blister returned. She's also complaining of elongated toenails.  Objective: Vital signs are stable she is alert and oriented 3. Vital signs are stable she is alert and oriented 3. Pulses are palpable. Neurologic system is intact reflexes are intact muscle strength is normal. Orthopedic evaluation demonstrates mallet toe deformity hammertoe deformities. She has osteoarthritic changes to the fourth digit of the right foot resulting in adductovarus rotation and hypertrophic PIPJ resulting in irritation and blistering of the soft tissue I'm I debrided the area today appears to be normal healing tissue beneath it does not appear to be cancerous in nature. Nothing is simply a friction issue. Toenails are long thick yellow dystrophic onychomycotic.  Assessment: Hammertoe deformity with osteoarthritis resulting in chronic blistering of the fourth digit of the right foot. And pain in limb secondary to nail dystrophy and onychomycosis.  Plan: Debridement of toenails 1 through 5 bilateral. Debrided wound today placed in a silicone pad will follow up with her as needed.

## 2016-06-04 ENCOUNTER — Telehealth: Payer: Self-pay | Admitting: Rheumatology

## 2016-06-04 NOTE — Telephone Encounter (Signed)
Patient would like to know if she needs labs done before coming into the office for the prolia injection? Patient is requesting a call back today.

## 2016-06-04 NOTE — Telephone Encounter (Signed)
Patient advised she will need labs before her Prolia injection. Patient verbalized understanding.

## 2016-06-18 ENCOUNTER — Other Ambulatory Visit: Payer: Self-pay | Admitting: *Deleted

## 2016-06-18 DIAGNOSIS — Z79899 Other long term (current) drug therapy: Secondary | ICD-10-CM

## 2016-06-18 LAB — COMPLETE METABOLIC PANEL WITH GFR
ALBUMIN: 4.1 g/dL (ref 3.6–5.1)
ALK PHOS: 52 U/L (ref 33–130)
ALT: 13 U/L (ref 6–29)
AST: 20 U/L (ref 10–35)
BUN: 19 mg/dL (ref 7–25)
CO2: 28 mmol/L (ref 20–31)
Calcium: 9.2 mg/dL (ref 8.6–10.4)
Chloride: 100 mmol/L (ref 98–110)
Creat: 0.55 mg/dL — ABNORMAL LOW (ref 0.60–0.93)
GFR, Est African American: >89 mL/min (ref >=60)
Glucose, Bld: 86 mg/dL (ref 65–99)
POTASSIUM: 4.5 mmol/L (ref 3.5–5.3)
SODIUM: 136 mmol/L (ref 135–146)
Total Bilirubin: 0.5 mg/dL (ref 0.2–1.2)
Total Protein: 6.4 g/dL (ref 6.1–8.1)

## 2016-06-18 LAB — CBC WITH DIFFERENTIAL/PLATELET
BASOS ABS: 0 {cells}/uL (ref 0–200)
Basophils Relative: 0 %
Eosinophils Absolute: 47 cells/uL (ref 15–500)
Eosinophils Relative: 1 %
HCT: 41.9 % (ref 35.0–45.0)
HEMOGLOBIN: 13.7 g/dL (ref 11.7–15.5)
LYMPHS ABS: 940 {cells}/uL (ref 850–3900)
Lymphocytes Relative: 20 %
MCH: 30 pg (ref 27.0–33.0)
MCHC: 32.7 g/dL (ref 32.0–36.0)
MCV: 91.7 fL (ref 80.0–100.0)
MONOS PCT: 9 %
MPV: 9 fL (ref 7.5–12.5)
Monocytes Absolute: 423 cells/uL (ref 200–950)
NEUTROS ABS: 3290 {cells}/uL (ref 1500–7800)
NEUTROS PCT: 70 %
PLATELETS: 195 10*3/uL (ref 140–400)
RBC: 4.57 MIL/uL (ref 3.80–5.10)
RDW: 13.2 % (ref 11.0–15.0)
WBC: 4.7 10*3/uL (ref 3.8–10.8)

## 2016-06-19 NOTE — Progress Notes (Signed)
Labs normal.

## 2016-06-20 ENCOUNTER — Ambulatory Visit (INDEPENDENT_AMBULATORY_CARE_PROVIDER_SITE_OTHER): Payer: Medicare Other | Admitting: *Deleted

## 2016-06-20 ENCOUNTER — Other Ambulatory Visit: Payer: Self-pay | Admitting: *Deleted

## 2016-06-20 DIAGNOSIS — M810 Age-related osteoporosis without current pathological fracture: Secondary | ICD-10-CM | POA: Diagnosis not present

## 2016-06-20 DIAGNOSIS — Z79899 Other long term (current) drug therapy: Secondary | ICD-10-CM

## 2016-06-20 MED ORDER — DENOSUMAB 60 MG/ML ~~LOC~~ SOLN
60.0000 mg | Freq: Once | SUBCUTANEOUS | Status: AC
  Administered 2016-06-20: 60 mg via SUBCUTANEOUS

## 2016-06-20 NOTE — Progress Notes (Signed)
Patient in office today for a Prolia injection. Patient will follow up with CMP in 10 days. Patient tolerated injection well.   Administrations This Visit    denosumab (PROLIA) injection 60 mg    Admin Date 06/20/2016 Action Given Dose 60 mg Route Subcutaneous Administered By Carole Binning, LPN

## 2016-07-02 ENCOUNTER — Other Ambulatory Visit: Payer: Self-pay | Admitting: *Deleted

## 2016-07-02 DIAGNOSIS — Z79899 Other long term (current) drug therapy: Secondary | ICD-10-CM

## 2016-07-02 LAB — COMPLETE METABOLIC PANEL WITH GFR
ALBUMIN: 3.9 g/dL (ref 3.6–5.1)
ALK PHOS: 51 U/L (ref 33–130)
ALT: 12 U/L (ref 6–29)
AST: 20 U/L (ref 10–35)
BILIRUBIN TOTAL: 0.6 mg/dL (ref 0.2–1.2)
BUN: 15 mg/dL (ref 7–25)
CALCIUM: 9.1 mg/dL (ref 8.6–10.4)
CO2: 30 mmol/L (ref 20–31)
CREATININE: 0.57 mg/dL — AB (ref 0.60–0.93)
Chloride: 99 mmol/L (ref 98–110)
GFR, EST NON AFRICAN AMERICAN: 89 mL/min (ref >=60)
Glucose, Bld: 95 mg/dL (ref 65–99)
Potassium: 4.1 mmol/L (ref 3.5–5.3)
Sodium: 137 mmol/L (ref 135–146)
Total Protein: 6.5 g/dL (ref 6.1–8.1)

## 2016-07-02 NOTE — Progress Notes (Signed)
WNL

## 2016-10-04 ENCOUNTER — Other Ambulatory Visit: Payer: Self-pay | Admitting: Rheumatology

## 2016-12-27 ENCOUNTER — Other Ambulatory Visit: Payer: Self-pay | Admitting: Rheumatology

## 2017-01-01 ENCOUNTER — Other Ambulatory Visit: Payer: Self-pay | Admitting: *Deleted

## 2017-01-01 DIAGNOSIS — Z79899 Other long term (current) drug therapy: Secondary | ICD-10-CM

## 2017-01-02 LAB — CBC WITH DIFFERENTIAL/PLATELET
BASOS PCT: 0.2 %
Basophils Absolute: 10 cells/uL (ref 0–200)
EOS PCT: 1 %
Eosinophils Absolute: 48 cells/uL (ref 15–500)
HCT: 38.2 % (ref 35.0–45.0)
Hemoglobin: 12.8 g/dL (ref 11.7–15.5)
Lymphs Abs: 1109 cells/uL (ref 850–3900)
MCH: 29.5 pg (ref 27.0–33.0)
MCHC: 33.5 g/dL (ref 32.0–36.0)
MCV: 88 fL (ref 80.0–100.0)
MONOS PCT: 8.3 %
MPV: 9 fL (ref 7.5–12.5)
NEUTROS PCT: 67.4 %
Neutro Abs: 3235 cells/uL (ref 1500–7800)
PLATELETS: 212 10*3/uL (ref 140–400)
RBC: 4.34 10*6/uL (ref 3.80–5.10)
RDW: 12 % (ref 11.0–15.0)
TOTAL LYMPHOCYTE: 23.1 %
WBC mixed population: 398 cells/uL (ref 200–950)
WBC: 4.8 10*3/uL (ref 3.8–10.8)

## 2017-01-02 LAB — COMPLETE METABOLIC PANEL WITH GFR
AG RATIO: 1.6 (calc) (ref 1.0–2.5)
ALT: 9 U/L (ref 6–29)
AST: 16 U/L (ref 10–35)
Albumin: 4.1 g/dL (ref 3.6–5.1)
Alkaline phosphatase (APISO): 54 U/L (ref 33–130)
BUN: 17 mg/dL (ref 7–25)
CO2: 30 mmol/L (ref 20–32)
Calcium: 9.6 mg/dL (ref 8.6–10.4)
Chloride: 99 mmol/L (ref 98–110)
Creat: 0.61 mg/dL (ref 0.60–0.93)
GFR, EST AFRICAN AMERICAN: 101 mL/min/{1.73_m2} (ref >=60)
GFR, Est Non African American: 87 mL/min/{1.73_m2} (ref >=60)
GLUCOSE: 119 mg/dL — AB (ref 65–99)
Globulin: 2.5 g/dL (calc) (ref 1.9–3.7)
POTASSIUM: 4.8 mmol/L (ref 3.5–5.3)
Sodium: 135 mmol/L (ref 135–146)
TOTAL PROTEIN: 6.6 g/dL (ref 6.1–8.1)
Total Bilirubin: 0.5 mg/dL (ref 0.2–1.2)

## 2017-01-02 NOTE — Telephone Encounter (Signed)
Last Visit: 04/24/16 Next Visit was due in March 2018. Message sent to the front to schedule patient. Labs: 01/01/17 WNL except elevated glucose   Okay to refill per Dr. Estanislado Pandy

## 2017-01-02 NOTE — Progress Notes (Signed)
Labs are stable.

## 2017-01-06 ENCOUNTER — Telehealth: Payer: Self-pay | Admitting: Rheumatology

## 2017-01-06 NOTE — Telephone Encounter (Signed)
-----   Message from Carole Binning, LPN sent at 35/05/6142  8:39 AM EDT ----- Regarding: Please schedule patient a follow up visit.  Please schedule patient a follow up visit. Patient was due March 2018. Thanks!

## 2017-01-06 NOTE — Telephone Encounter (Signed)
Left message on machine for patient to call back to schedule follow up.

## 2017-01-09 ENCOUNTER — Telehealth: Payer: Self-pay | Admitting: Rheumatology

## 2017-01-09 NOTE — Telephone Encounter (Signed)
I Called to sch delivery of Prolia will be delivered 01/14/17 it will be delivered / please call pt to sch nurse visit

## 2017-01-09 NOTE — Telephone Encounter (Signed)
[facility] calling to schedule delivery of Prolia.

## 2017-01-09 NOTE — Telephone Encounter (Signed)
Scheduled for 01/15/17.

## 2017-01-14 ENCOUNTER — Telehealth: Payer: Self-pay | Admitting: *Deleted

## 2017-01-14 NOTE — Telephone Encounter (Signed)
Patient's Prolia was delivered to the office on 01/14/17.

## 2017-01-15 ENCOUNTER — Ambulatory Visit: Payer: Medicare Other

## 2017-01-15 NOTE — Telephone Encounter (Signed)
Patient is scheduled for injection on 01/15/17 for Prolia injection.

## 2017-01-27 ENCOUNTER — Other Ambulatory Visit: Payer: Self-pay

## 2017-01-27 DIAGNOSIS — Z79899 Other long term (current) drug therapy: Secondary | ICD-10-CM

## 2017-01-28 LAB — CBC WITH DIFFERENTIAL/PLATELET
BASOS PCT: 0.6 %
Basophils Absolute: 29 cells/uL (ref 0–200)
EOS ABS: 49 {cells}/uL (ref 15–500)
Eosinophils Relative: 1 %
HCT: 40.1 % (ref 35.0–45.0)
Hemoglobin: 13.6 g/dL (ref 11.7–15.5)
Lymphs Abs: 1152 cells/uL (ref 850–3900)
MCH: 29.8 pg (ref 27.0–33.0)
MCHC: 33.9 g/dL (ref 32.0–36.0)
MCV: 87.7 fL (ref 80.0–100.0)
MPV: 9.6 fL (ref 7.5–12.5)
Monocytes Relative: 10.3 %
Neutro Abs: 3165 cells/uL (ref 1500–7800)
Neutrophils Relative %: 64.6 %
PLATELETS: 229 10*3/uL (ref 140–400)
RBC: 4.57 10*6/uL (ref 3.80–5.10)
RDW: 11.8 % (ref 11.0–15.0)
TOTAL LYMPHOCYTE: 23.5 %
WBC: 4.9 10*3/uL (ref 3.8–10.8)
WBCMIX: 505 {cells}/uL (ref 200–950)

## 2017-01-28 LAB — COMPLETE METABOLIC PANEL WITH GFR
AG RATIO: 1.7 (calc) (ref 1.0–2.5)
ALKALINE PHOSPHATASE (APISO): 52 U/L (ref 33–130)
ALT: 12 U/L (ref 6–29)
AST: 19 U/L (ref 10–35)
Albumin: 4.5 g/dL (ref 3.6–5.1)
BUN: 15 mg/dL (ref 7–25)
CO2: 29 mmol/L (ref 20–32)
CREATININE: 0.6 mg/dL (ref 0.60–0.93)
Calcium: 9.5 mg/dL (ref 8.6–10.4)
Chloride: 100 mmol/L (ref 98–110)
GFR, EST AFRICAN AMERICAN: 101 mL/min/{1.73_m2} (ref >=60)
GFR, Est Non African American: 87 mL/min/{1.73_m2} (ref >=60)
GLOBULIN: 2.6 g/dL (ref 1.9–3.7)
Glucose, Bld: 73 mg/dL (ref 65–99)
Potassium: 5.6 mmol/L — ABNORMAL HIGH (ref 3.5–5.3)
SODIUM: 136 mmol/L (ref 135–146)
Total Bilirubin: 0.5 mg/dL (ref 0.2–1.2)
Total Protein: 7.1 g/dL (ref 6.1–8.1)

## 2017-01-28 NOTE — Progress Notes (Signed)
I called patient. She's not taking any potassium supplement. Most likely the lab values hemolyzed.

## 2017-02-05 ENCOUNTER — Ambulatory Visit: Payer: Medicare Other | Admitting: Rheumatology

## 2017-04-23 ENCOUNTER — Encounter: Payer: Self-pay | Admitting: Rheumatology

## 2017-05-05 ENCOUNTER — Telehealth: Payer: Self-pay

## 2017-05-05 NOTE — Telephone Encounter (Signed)
Bone Density T-score -2.4 Osteopenia   Advised patient to continue with calcium and vit d. Will repeat in 2 years.

## 2017-06-02 ENCOUNTER — Ambulatory Visit: Payer: Medicare Other | Admitting: Rheumatology

## 2017-06-09 NOTE — Progress Notes (Signed)
Office Visit Note  Patient: Ellen Thomas             Date of Birth: 06-01-1935           MRN: [id number]             PCP: Bernerd Limbo, MD Referring: Bernerd Limbo, MD Visit Date: 06/23/2017 Occupation: @GUAROCC @    Subjective:  Right thumb pain    History of Present Illness: Ellen Thomas is a 82 y.o. female with history of osteoporosis and polymyalgia rheumatica.  She reports no recent flares or polymyalgia rheumatica.  She states that she has been in remission since 2014.  She has not had any recent prednisone use.  She states that she is due for her next Prolia injection in 2019.  She states that she continues to take calcium and vitamin D on a regular basis.  She states that she has been going to water aerobics 2-3 times a week for exercise.  Patient states that she has been having increased pain in her right thumb.  She states that she has had 2 aspirations of a ganglion cyst by Dr. Grandville Silos.  She states that during her last aspiration she started to experience some pain in her right [facility] joint.  She says she has been wearing her right wrist brace that has been helping with her pain.  She states she has right hand and wrist dominant.  She denies any joint swelling.  She states she has been using ibuprofen for pain relief.  She is also been trying to rest her wrist.   Activities of Daily Living:  Patient reports morning stiffness for 0.   Patient Reports nocturnal pain.  Difficulty dressing/grooming: Denies Difficulty climbing stairs: Denies Difficulty getting out of chair: Denies Difficulty using hands for taps, buttons, cutlery, and/or writing: Denies   Review of Systems  Constitutional: Negative for activity change and fatigue.  HENT: Positive for mouth dryness. Negative for mouth sores and nose dryness.   Eyes: Positive for dryness. Negative for pain and visual disturbance.  Respiratory: Negative for cough, hemoptysis, shortness of breath and difficulty breathing.    Cardiovascular: Negative for chest pain, palpitations, hypertension and swelling in legs/feet.  Gastrointestinal: Negative for blood in stool, constipation and diarrhea.  Endocrine: Negative for excessive thirst and increased urination.  Genitourinary: Negative for difficulty urinating and painful urination.  Musculoskeletal: Positive for arthralgias, gait problem, joint pain, joint swelling and muscle tenderness. Negative for myalgias, muscle weakness, morning stiffness and myalgias.  Skin: Negative for color change, pallor, rash, hair loss, nodules/bumps, skin tightness, ulcers and sensitivity to sunlight.  Neurological: Positive for numbness and weakness. Negative for dizziness and headaches.  Hematological: Negative for bruising/bleeding tendency and swollen glands.  Psychiatric/Behavioral: Negative for depressed mood and sleep disturbance. The patient is not nervous/anxious.     PMFS History:  Patient Active Problem List   Diagnosis Date Noted  . History of humerus fracture 04/23/2016  . History of gastroesophageal reflux (GERD) 04/23/2016  . Positive ANA (antinuclear antibody) 04/23/2016  . History of poliomyelitis 04/23/2016  . Vitiligo 04/23/2016  . Scoliosis 04/23/2016  . Foreign body granuloma of skin and subcutaneous tissue 11/16/2015  . At risk for falling 03/24/2015  . Bronchitis 03/10/2014  . Encounter for general adult medical examination without abnormal findings 02/13/2014  . BMI (body mass index), pediatric, 5% to less than 85% for age 34/07/2013  . Hypercholesterolemia 04/05/2013  . Benign hypertension 04/05/2013  . Polymyalgia rheumatica ([place]) 04/05/2013  .  Other specified health status 04/05/2013  . Disease of skin and subcutaneous tissue 03/11/2013  . OP (osteoporosis) 03/11/2013  . 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 Esophagitis, reflux 03/11/2013  . Neuralgia neuritis, sciatic nerve 03/11/2013  . Encounter for screening for eye and  ear disorders 03/11/2013  . MGUS (monoclonal gammopathy of unknown significance) 11/01/2011    Past Medical History:  Diagnosis Date  . High cholesterol   . Hypertension   . Kyphosis   . PMR (polymyalgia rheumatica) (HCC)   . Polio   . Scoliosis   . Vitiligo     Family History  Problem Relation Age of Onset  . Diabetes Mother    Past Surgical History:  Procedure Laterality Date  . ABDOMINAL HYSTERECTOMY  1993  . SHOULDER FUSION SURGERY Left    took a piece of hip and fused it into left shoulder   Social History   Social History Narrative  . Not on file     Objective: Vital Signs: BP (!) 124/59 (BP Location: Left Arm, Patient Position: Sitting, Cuff Size: Normal)   Pulse 65   Resp 16   Ht 5' 1.5" (1.562 m)   Wt 108 lb (49 kg)   BMI 20.08 kg/m    Physical Exam  Constitutional: She is oriented to person, place, and time. She appears well-developed and well-nourished.  HENT:  Head: Normocephalic and atraumatic.  Eyes: Conjunctivae and EOM are normal.  Neck: Normal range of motion.  Cardiovascular: Normal rate, regular rhythm, normal heart sounds and intact distal pulses.  Pulmonary/Chest: Effort normal and breath sounds normal.  Abdominal: Soft. Bowel sounds are normal.  Lymphadenopathy:    She has no cervical adenopathy.  Neurological: She is alert and oriented to person, place, and time.  Skin: Skin is warm and dry. Capillary refill takes less than 2 seconds.  Psychiatric: She has a normal mood and affect. Her behavior is normal.  Nursing note and vitals reviewed.    Musculoskeletal Exam: C-spine very limited range of motion.  Thoracic and lumbar spine limited ROM.  Scoliosis present.  No midline spinal tenderness.  No SI joint tenderness.  Elbow flexion contracture.  Right elbow good range of motion.  Bilateral wrists good range of motion with no synovitis.  MCPs PIPs and DIPs good range of motion with no synovitis.  She has right CMC joint tenderness.  She has  PIP and DIP synovial thickening consistent with osteoarthritis.  Hip joints, knee joints, ankle joints, MTPs, PIPs, DIPs good range of motion with no synovitis.  No warmth or effusion of bilateral knees.  No trochanteric bursa tenderness.  CDAI Exam: No CDAI exam completed.    Investigation: No additional findings. CBC Latest Ref Rng & Units 01/27/2017 01/01/2017 06/18/2016  WBC 3.8 - 10.8 Thousand/uL 4.9 4.8 4.7  Hemoglobin 11.7 - 15.5 g/dL 13.6 12.8 13.7  Hematocrit 35.0 - 45.0 % 40.1 38.2 41.9  Platelets 140 - 400 Thousand/uL 229 212 195   CMP Latest Ref Rng & Units 01/27/2017 01/01/2017 07/02/2016  Glucose 65 - 99 mg/dL 73 119(H) 95  BUN 7 - 25 mg/dL 15 17 15   Creatinine 0.60 - 0.93 mg/dL 0.60 0.61 0.57(L)  Sodium 135 - 146 mmol/L 136 135 137  Potassium 3.5 - 5.3 mmol/L 5.6(H) 4.8 4.1  Chloride 98 - 110 mmol/L 100 99 99  CO2 20 - 32 mmol/L 29 30 30   Calcium 8.6 - 10.4 mg/dL 9.5 9.6 9.1  Total Protein 6.1 - 8.1 g/dL 7.1 6.6  6.5  Total Bilirubin 0.2 - 1.2 mg/dL 0.5 0.5 0.6  Alkaline Phos 33 - 130 U/L - - 51  AST 10 - 35 U/L 19 16 20   ALT 6 - 29 U/L 12 9 12     Imaging: No results found.  Speciality Comments: No specialty comments available.    Procedures:  No procedures performed Allergies: Other and Tetracyclines & related   Assessment / Plan:     Visit Diagnoses: Age-related osteoporosis without current pathological fracture -patient has been treated with bisphosphonates and Forteo in the past.  Her last DEXA scan was on 04/23/2017 revealed a T score of -2.4.  She is due for her next Prolia injection in April 2019.  She was made aware that she will be due for lab work 10 days before 10 days after her Prolia injection.  She continues to take vitamin D and calcium on a regular basis.  Orders for CMP were placed today.  Polymyalgia rheumatica ([place]) - In remission since 2014.  No recent flares.  Not been on prednisone recently.  Positive ANA (antinuclear antibody) - Positive  SSA: She has mild sicca symptoms for which she uses OTC products.  Thumb pain, right: She is tenderness of the right CMC joint.  She has had 2 aspirations of a ganglion cyst on the radial aspect of her right wrist by Dr. Grandville Silos.  During her last aspiration she started experiencing some discomfort in the right [facility] joint.  She has been wearing a right wrist brace that has been helping.  She is also been trying to rest her right wrist and avoiding overuse activities.  She was given a handout of hand exercises that she can perform at home.  We discussed Dr. Estanislado Pandy performing a right [facility] cortisone injection in the future if she continues to have significant pain.  She has been taking ibuprofen for pain relief.  Other medical conditions are listed as follows:   Scoliosis  History of humerus fracture - left, After fall 2016  MGUS (monoclonal gammopathy of unknown significance) - Followed up by oncology  Vitiligo  History of gastroesophageal reflux (GERD)  Dyslipidemia  History of poliomyelitis  History of hypertension    Orders: No orders of the defined types were placed in this encounter.  No orders of the defined types were placed in this encounter.   Face-to-face time spent with patient was 30 minutes. >50% of time was spent in counseling and coordination of care.  Follow-Up Instructions: Return in about 6 months (around 12/24/2017) for Osteoporosis, Polymyalgia Rheumatica.   Ofilia Neas, PA-C    I examined and evaluated the patient with Ellen Sams PA.  Her most recent DEXA was reviewed with her.  She will get Prolia next month.  The plan of care was discussed as noted above.  Bo Merino, MD  Note - This record has been created using Editor, commissioning.  Chart creation errors have been sought, but may not always  have been located. Such creation errors do not reflect on  the standard of medical care.

## 2017-06-23 ENCOUNTER — Encounter: Payer: Self-pay | Admitting: Rheumatology

## 2017-06-23 ENCOUNTER — Ambulatory Visit: Payer: Medicare Other | Admitting: Rheumatology

## 2017-06-23 VITALS — BP 124/59 | HR 65 | Resp 16 | Ht 61.5 in | Wt 108.0 lb

## 2017-06-23 DIAGNOSIS — L80 Vitiligo: Secondary | ICD-10-CM

## 2017-06-23 DIAGNOSIS — M810 Age-related osteoporosis without current pathological fracture: Secondary | ICD-10-CM

## 2017-06-23 DIAGNOSIS — D472 Monoclonal gammopathy: Secondary | ICD-10-CM | POA: Diagnosis not present

## 2017-06-23 DIAGNOSIS — M41125 Adolescent idiopathic scoliosis, thoracolumbar region: Secondary | ICD-10-CM

## 2017-06-23 DIAGNOSIS — Z8719 Personal history of other diseases of the digestive system: Secondary | ICD-10-CM

## 2017-06-23 DIAGNOSIS — M79644 Pain in right finger(s): Secondary | ICD-10-CM | POA: Diagnosis not present

## 2017-06-23 DIAGNOSIS — R768 Other specified abnormal immunological findings in serum: Secondary | ICD-10-CM

## 2017-06-23 DIAGNOSIS — M353 Polymyalgia rheumatica: Secondary | ICD-10-CM | POA: Diagnosis not present

## 2017-06-23 DIAGNOSIS — Z5181 Encounter for therapeutic drug level monitoring: Secondary | ICD-10-CM

## 2017-06-23 DIAGNOSIS — Z8612 Personal history of poliomyelitis: Secondary | ICD-10-CM

## 2017-06-23 DIAGNOSIS — Z8781 Personal history of (healed) traumatic fracture: Secondary | ICD-10-CM

## 2017-06-23 DIAGNOSIS — E785 Hyperlipidemia, unspecified: Secondary | ICD-10-CM | POA: Diagnosis not present

## 2017-06-23 DIAGNOSIS — Z8679 Personal history of other diseases of the circulatory system: Secondary | ICD-10-CM

## 2017-06-23 NOTE — Patient Instructions (Signed)

## 2017-07-10 ENCOUNTER — Other Ambulatory Visit: Payer: Self-pay

## 2017-07-10 MED ORDER — DENOSUMAB 60 MG/ML ~~LOC~~ SOLN: SUBCUTANEOUS | 0 refills | Status: DC

## 2017-07-10 NOTE — Progress Notes (Signed)
Last note shows rx was phoned in to [facility], that is incorrect. A verbal was given to Fortino Sic, a pharmacist with [phone] Rx.

## 2017-07-10 NOTE — Progress Notes (Signed)
Received a call from [facility] to refill Prolia. Spoke to pharmacist, Fortino Sic. and gave her a verbal.

## 2017-07-25 ENCOUNTER — Telehealth: Payer: Self-pay | Admitting: Rheumatology

## 2017-07-25 NOTE — Telephone Encounter (Signed)
[facility] calling to schedule delivery of Prolia for patient. Delivery scheduled for Jul 31, 2017 Thursday.

## 2017-08-01 ENCOUNTER — Other Ambulatory Visit: Payer: Self-pay

## 2017-08-01 DIAGNOSIS — Z5181 Encounter for therapeutic drug level monitoring: Secondary | ICD-10-CM

## 2017-08-02 LAB — COMPLETE METABOLIC PANEL WITH GFR
AG Ratio: 1.8 (calc) (ref 1.0–2.5)
ALBUMIN MSPROF: 4.2 g/dL (ref 3.6–5.1)
ALT: 13 U/L (ref 6–29)
AST: 20 U/L (ref 10–35)
Alkaline phosphatase (APISO): 57 U/L (ref 33–130)
BILIRUBIN TOTAL: 0.4 mg/dL (ref 0.2–1.2)
BUN / CREAT RATIO: 29 (calc) — AB (ref 6–22)
BUN: 16 mg/dL (ref 7–25)
CALCIUM: 9.4 mg/dL (ref 8.6–10.4)
CHLORIDE: 94 mmol/L — AB (ref 98–110)
CO2: 30 mmol/L (ref 20–32)
CREATININE: 0.55 mg/dL — AB (ref 0.60–0.93)
GFR, EST AFRICAN AMERICAN: 103 mL/min/{1.73_m2} (ref >=60)
GFR, EST NON AFRICAN AMERICAN: 89 mL/min/{1.73_m2} (ref >=60)
Globulin: 2.4 g/dL (calc) (ref 1.9–3.7)
Glucose, Bld: 85 mg/dL (ref 65–99)
Potassium: 4.3 mmol/L (ref 3.5–5.3)
Sodium: 131 mmol/L — ABNORMAL LOW (ref 135–146)
Total Protein: 6.6 g/dL (ref 6.1–8.1)

## 2017-08-04 NOTE — Progress Notes (Signed)
Sodium is low.  Please notify patient and forward results to PCP.  All other labs are stable.

## 2017-08-06 NOTE — Progress Notes (Signed)
Office Visit Note  Patient: Ellen Thomas             Date of Birth: [birth year]           MRN: [id number]             PCP: Bernerd Limbo, MD Referring: Bernerd Limbo, MD Visit Date: 08/07/2017 Occupation: @GUAROCC @    Subjective:  Generalized pain.   History of Present Illness: Ellen Thomas is a 82 y.o. female with history of polymyalgia rheumatica, osteoporosis.  She states that last week she did some gardening and after that she had to clean up after she changed her washer and dryer as there was a leakage.  The following day she started having some discomfort in her joints.  She went to a water aerobics class and experience more pain.  She states her symptoms have been gradually getting worse.  She describes pain and stiffness in her neck and lower back in her right shoulder, bilateral hands, right hip and bilateral feet.  She believes that she has some swelling in her hands.  She is concerned that she may be having a flare of polymyalgia as she is aching all over now.  Patient states that she took  ibuprofen and Tylenol yesterday which relieved some of her discomfort.  Activities of Daily Living:  Patient reports morning stiffness for all day hours.   Patient Denies nocturnal pain.  Difficulty dressing/grooming: Denies Difficulty climbing stairs: Reports Difficulty getting out of chair: Reports Difficulty using hands for taps, buttons, cutlery, and/or writing: Reports   Review of Systems  Constitutional: Positive for fatigue. Negative for night sweats, weight gain and weight loss.  HENT: Negative for mouth sores, trouble swallowing, trouble swallowing, mouth dryness and nose dryness.   Eyes: Negative for pain, redness, visual disturbance and dryness.  Respiratory: Negative for cough, shortness of breath and difficulty breathing.   Cardiovascular: Negative for chest pain, palpitations, hypertension, irregular heartbeat and swelling in legs/feet.  Gastrointestinal:  Negative for blood in stool, constipation and diarrhea.  Endocrine: Negative for increased urination.  Genitourinary: Negative for vaginal dryness.  Musculoskeletal: Positive for arthralgias, joint pain, joint swelling, myalgias, morning stiffness and myalgias. Negative for muscle weakness and muscle tenderness.  Skin: Negative for color change, rash, hair loss, skin tightness, ulcers and sensitivity to sunlight.  Allergic/Immunologic: Negative for susceptible to infections.  Neurological: Negative for dizziness, memory loss, night sweats and weakness.  Hematological: Negative for swollen glands.  Psychiatric/Behavioral: Negative for depressed mood and sleep disturbance. The patient is not nervous/anxious.     PMFS History:  Patient Active Problem List   Diagnosis Date Noted  . History of humerus fracture 04/23/2016  . History of gastroesophageal reflux (GERD) 04/23/2016  . Positive ANA (antinuclear antibody) 04/23/2016  . History of poliomyelitis 04/23/2016  . Vitiligo 04/23/2016  . Scoliosis 04/23/2016  . Foreign body granuloma of skin and subcutaneous tissue 11/16/2015  . At risk for falling 03/24/2015  . Bronchitis 03/10/2014  . Encounter for general adult medical examination without abnormal findings 02/13/2014  . BMI (body mass index), pediatric, 5% to less than 85% for age 69/07/2013  . Hypercholesterolemia 04/05/2013  . Benign hypertension 04/05/2013  . Polymyalgia rheumatica ([place]) 04/05/2013  . Other specified health status 04/05/2013  . Disease of skin and subcutaneous tissue 03/11/2013  . OP (osteoporosis) 03/11/2013  . 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 Esophagitis, reflux 03/11/2013  . Neuralgia neuritis, sciatic nerve 03/11/2013  . Encounter for screening for  eye and ear disorders 03/11/2013  . MGUS (monoclonal gammopathy of unknown significance) 11/01/2011    Past Medical History:  Diagnosis Date  . High cholesterol   . Hypertension   .  Kyphosis   . PMR (polymyalgia rheumatica) (HCC)   . Polio   . Scoliosis   . Vitiligo     Family History  Problem Relation Age of Onset  . Diabetes Mother   . Cancer Brother    Past Surgical History:  Procedure Laterality Date  . ABDOMINAL HYSTERECTOMY  1993  . SHOULDER FUSION SURGERY Left    took a piece of hip and fused it into left shoulder   Social History   Social History Narrative  . Not on file     Objective: Vital Signs: BP 124/67 (BP Location: Right Arm, Patient Position: Sitting, Cuff Size: Normal)   Pulse 71   Resp 14   Ht 5' 1.5" (1.562 m)   Wt 105 lb (47.6 kg)   BMI 19.52 kg/m    Physical Exam  Constitutional: She is oriented to person, place, and time. She appears well-developed and well-nourished.  HENT:  Head: Normocephalic and atraumatic.  Eyes: Conjunctivae and EOM are normal.  Neck: Normal range of motion.  Cardiovascular: Normal rate, regular rhythm, normal heart sounds and intact distal pulses.  Pulmonary/Chest: Effort normal and breath sounds normal.  Abdominal: Soft. Bowel sounds are normal.  Lymphadenopathy:    She has no cervical adenopathy.  Neurological: She is alert and oriented to person, place, and time.  Left sided weakness due to polio  Skin: Skin is warm and dry. Capillary refill takes less than 2 seconds.  Psychiatric: She has a normal mood and affect. Her behavior is normal.  Nursing note and vitals reviewed.    Musculoskeletal Exam: C-spine thoracic lumbar spine limited range of motion.  She has limited range of motion of bilateral shoulder joints which is no change.  Although she had discomfort range of motion of her right shoulder.  She had no discomfort range of motion of her elbow joints.  She has painful range of motion of bilateral wrist joints with tenderness on palpation of bilateral wrist joints.  She appears to have some tenosynovitis in her right wrist.  She has tenderness across her PIPs and her right hand.  She also  had tenderness in her left second PIP joint.  No MCP joint swelling or tenderness was noted.  She had painful range of motion of right hip.  She has warmth and discomfort range of motion of her right knee joint.  She has warmth and swelling in her right ankle joint.  She attempted tenderness across the MTPs was noted.  No synovitis in the MTPs was noted.  CDAI Exam: CDAI Homunculus Exam:   Tenderness:  RUE: glenohumeral and wrist Right hand: 2nd PIP, 3rd PIP and 4th PIP Left hand: 2nd PIP RLE: tibiofemoral and tibiotalar  Swelling:  RUE: wrist Right hand: 4th PIP Left hand: 2nd PIP RLE: tibiofemoral and tibiotalar  Joint Counts:  CDAI Tender Joint count: 7 CDAI Swollen Joint count: 4  Global Assessments:  Patient Global Assessment: 7 Provider Global Assessment: 7  CDAI Calculated Score: 25    Investigation: No additional findings. CBC Latest Ref Rng & Units 01/27/2017 01/01/2017 06/18/2016  WBC 3.8 - 10.8 Thousand/uL 4.9 4.8 4.7  Hemoglobin 11.7 - 15.5 g/dL 13.6 12.8 13.7  Hematocrit 35.0 - 45.0 % 40.1 38.2 41.9  Platelets 140 - 400 Thousand/uL 229 212 195  CMP Latest Ref Rng & Units 08/01/2017 01/27/2017 01/01/2017  Glucose 65 - 99 mg/dL 85 73 119(H)  BUN 7 - 25 mg/dL 16 15 17   Creatinine 0.60 - 0.93 mg/dL 0.55(L) 0.60 0.61  Sodium 135 - 146 mmol/L 131(L) 136 135  Potassium 3.5 - 5.3 mmol/L 4.3 5.6(H) 4.8  Chloride 98 - 110 mmol/L 94(L) 100 99  CO2 20 - 32 mmol/L 30 29 30   Calcium 8.6 - 10.4 mg/dL 9.4 9.5 9.6  Total Protein 6.1 - 8.1 g/dL 6.6 7.1 6.6  Total Bilirubin 0.2 - 1.2 mg/dL 0.4 0.5 0.5  Alkaline Phos 33 - 130 U/L - - -  AST 10 - 35 U/L 20 19 16   ALT 6 - 29 U/L 13 12 9     Imaging: Xr Foot 2 Views Left  Result Date: 08/07/2017 PIP/DIP narrowing was noted.  No MTP joint narrowing or erosive changes were noted.  No intertarsal joint space narrowing was noted.  Posterior calcaneal spur was noted. Impression: These findings are consistent with osteoarthritis.    Xr Foot 2 Views Right  Result Date: 08/07/2017 PIP/DIP narrowing was noted.  No MTP joint narrowing or erosive changes were noted.  No intertarsal joint space narrowing was noted.  Posterior calcaneal spur was noted. Impression: These findings are consistent with osteoarthritis.   Xr Hand 2 View Left  Result Date: 08/07/2017 PIP/DIP and CMC narrowing was noted.  MCP or intercarpal joint space changes were noted.  No erosive changes were noted. Impression: These findings consistent with osteoarthritis of the hand.  Xr Hand 2 View Right  Result Date: 08/07/2017 PIP/DIP and CMC narrowing was noted.  MCP or intercarpal joint space changes were noted.  No erosive changes were noted. Impression: These findings consistent with osteoarthritis of the hand   Speciality Comments: No specialty comments available.    Procedures:  No procedures performed Allergies: Other and Tetracyclines & related   Assessment / Plan:     Visit Diagnoses: Pain in both hands -patient has new onset pain and swelling in her bilateral hands.  She also has some synovitis on examination today.  I will obtain following labs and x-rays today.  Plan: XR Hand 2 View Right, XR Hand 2 View Left, Uric acid, Sedimentation rate, Rheumatoid factor, Cyclic citrul peptide antibody, IgG, 14-3-3 eta Protein, ANA.  I am suspicious that she may be developing inflammatory arthritis like rheumatoid arthritis.  I will place her on prednisone 5 mg p.o. daily.  I will see her next week in case have to start her on immunosuppressive agents.  Pain in both feet -she has right ankle joint swelling and discomfort in her bilateral feet.  Plan: XR Foot 2 Views Right, XR Foot 2 Views Left  Polyarthralgia-she complains of discomfort in her C-spine, bilateral shoulders, bilateral wrist and hands, right hip, right knee and bilateral ankles.  Polymyalgia rheumatica ([place]) - In remission since 2014.    Positive ANA (antinuclear antibody) -  Positive SSA:  She has mild sicca symptoms for which she uses OTC products.  Age-related osteoporosis without current pathological fracture - Prolia. Her last DEXA scan was on 04/23/2017 revealed a T score of -2.4.  Scoliosis Other medical problems are listed as follows:  History of humerus fracture - left, After fall 2016  History of hypertension  MGUS (monoclonal gammopathy of unknown significance) - Followed up by oncology  History of poliomyelitis  History of gastroesophageal reflux (GERD)  Dyslipidemia  Vitiligo  Other fatigue - Plan: CBC with Differential/Platelet,  Urinalysis, Routine w reflex microscopic, CK, Glucose 6 phosphate dehydrogenase    Orders: Orders Placed This Encounter  Procedures  . XR Hand 2 View Right  . XR Hand 2 View Left  . XR Foot 2 Views Right  . XR Foot 2 Views Left  . CBC with Differential/Platelet  . Urinalysis, Routine w reflex microscopic  . CK  . Uric acid  . Sedimentation rate  . Rheumatoid factor  . Cyclic citrul peptide antibody, IgG  . 14-3-3 eta Protein  . ANA  . Glucose 6 phosphate dehydrogenase   No orders of the defined types were placed in this encounter.   Face-to-face time spent with patient was 40 minutes. >50% of time was spent in counseling and coordination of care.  Follow-Up Instructions: Return in about 1 week (around 08/14/2017) for Osteoporosis, PMR, inflammatory arthritis.   Bo Merino, MD  Note - This record has been created using Editor, commissioning.  Chart creation errors have been sought, but may not always  have been located. Such creation errors do not reflect on  the standard of medical care.

## 2017-08-07 ENCOUNTER — Encounter: Payer: Self-pay | Admitting: Rheumatology

## 2017-08-07 ENCOUNTER — Ambulatory Visit (INDEPENDENT_AMBULATORY_CARE_PROVIDER_SITE_OTHER): Payer: Self-pay

## 2017-08-07 ENCOUNTER — Other Ambulatory Visit: Payer: Self-pay

## 2017-08-07 ENCOUNTER — Ambulatory Visit: Payer: Medicare Other | Admitting: Rheumatology

## 2017-08-07 VITALS — BP 124/67 | HR 71 | Resp 14 | Ht 61.5 in | Wt 105.0 lb

## 2017-08-07 DIAGNOSIS — M810 Age-related osteoporosis without current pathological fracture: Secondary | ICD-10-CM

## 2017-08-07 DIAGNOSIS — E785 Hyperlipidemia, unspecified: Secondary | ICD-10-CM | POA: Diagnosis not present

## 2017-08-07 DIAGNOSIS — D472 Monoclonal gammopathy: Secondary | ICD-10-CM | POA: Diagnosis not present

## 2017-08-07 DIAGNOSIS — Z8679 Personal history of other diseases of the circulatory system: Secondary | ICD-10-CM

## 2017-08-07 DIAGNOSIS — M353 Polymyalgia rheumatica: Secondary | ICD-10-CM | POA: Diagnosis not present

## 2017-08-07 DIAGNOSIS — M79671 Pain in right foot: Secondary | ICD-10-CM | POA: Diagnosis not present

## 2017-08-07 DIAGNOSIS — Z8781 Personal history of (healed) traumatic fracture: Secondary | ICD-10-CM

## 2017-08-07 DIAGNOSIS — R768 Other specified abnormal immunological findings in serum: Secondary | ICD-10-CM | POA: Diagnosis not present

## 2017-08-07 DIAGNOSIS — M79642 Pain in left hand: Secondary | ICD-10-CM

## 2017-08-07 DIAGNOSIS — Z8719 Personal history of other diseases of the digestive system: Secondary | ICD-10-CM

## 2017-08-07 DIAGNOSIS — M41125 Adolescent idiopathic scoliosis, thoracolumbar region: Secondary | ICD-10-CM

## 2017-08-07 DIAGNOSIS — Z8612 Personal history of poliomyelitis: Secondary | ICD-10-CM

## 2017-08-07 DIAGNOSIS — L80 Vitiligo: Secondary | ICD-10-CM

## 2017-08-07 DIAGNOSIS — R5383 Other fatigue: Secondary | ICD-10-CM

## 2017-08-07 DIAGNOSIS — M79672 Pain in left foot: Secondary | ICD-10-CM

## 2017-08-07 DIAGNOSIS — M79641 Pain in right hand: Secondary | ICD-10-CM | POA: Diagnosis not present

## 2017-08-07 MED ORDER — PREDNISONE 5 MG PO TABS: 5.0000 mg | ORAL_TABLET | Freq: Every day | ORAL | 0 refills | Status: DC

## 2017-08-08 NOTE — Progress Notes (Signed)
Office Visit Note  Patient: Ellen Thomas             Date of Birth: [birth year]           MRN: [id number]             PCP: Bernerd Limbo, MD Referring: Bernerd Limbo, MD Visit Date: 08/11/2017 Occupation: _0 @    Subjective:  Pain and swelling in hands, muscle pain and weakness.Marland Kitchen   History of Present Illness: Ellen Thomas is a 82 y.o. female the of osteoarthritis polymyalgia and osteoporosis.  She was seen last week with increased pain and swelling in multiple joints.  She was also having muscle weakness and difficulty with mobility.  She was placed on low-dose prednisone at 5 mg a day until we had lab results back.  She noticed with the 5 mg of prednisone she is able to get up from the chair and her swelling has gone down but not completely resolved.  Activities of Daily Living:  Patient reports morning stiffness for 2 hours.   Patient Denies nocturnal pain.  Difficulty dressing/grooming: Denies Difficulty climbing stairs: Reports Difficulty getting out of chair: Reports Difficulty using hands for taps, buttons, cutlery, and/or writing: Reports   Review of Systems  Constitutional: Positive for fatigue. Negative for night sweats, weight gain and weight loss.  HENT: Negative for mouth sores, trouble swallowing, trouble swallowing, mouth dryness and nose dryness.   Eyes: Negative for pain, redness, visual disturbance and dryness.  Respiratory: Negative for cough, shortness of breath and difficulty breathing.   Cardiovascular: Negative for chest pain, palpitations, hypertension, irregular heartbeat and swelling in legs/feet.  Gastrointestinal: Negative for blood in stool, constipation and diarrhea.  Endocrine: Negative for increased urination.  Genitourinary: Negative for vaginal dryness.  Musculoskeletal: Positive for arthralgias, joint pain, joint swelling, myalgias, morning stiffness and myalgias. Negative for muscle weakness and muscle tenderness.  Skin:  Negative for color change, rash, hair loss, skin tightness, ulcers and sensitivity to sunlight.  Allergic/Immunologic: Negative for susceptible to infections.  Neurological: Negative for dizziness, memory loss, night sweats and weakness.  Hematological: Negative for swollen glands.  Psychiatric/Behavioral: Negative for depressed mood and sleep disturbance. The patient is not nervous/anxious.     PMFS History:  Patient Active Problem List   Diagnosis Date Noted  . History of humerus fracture 04/23/2016  . History of gastroesophageal reflux (GERD) 04/23/2016  . Positive ANA (antinuclear antibody) 04/23/2016  . History of poliomyelitis 04/23/2016  . Vitiligo 04/23/2016  . Scoliosis 04/23/2016  . Foreign body granuloma of skin and subcutaneous tissue 11/16/2015  . At risk for falling 03/24/2015  . Bronchitis 03/10/2014  . Encounter for general adult medical examination without abnormal findings 02/13/2014  . BMI (body mass index), pediatric, 5% to less than 85% for age 31/07/2013  . Hypercholesterolemia 04/05/2013  . Benign hypertension 04/05/2013  . Polymyalgia rheumatica ([place]) 04/05/2013  . Other specified health status 04/05/2013  . Disease of skin and subcutaneous tissue 03/11/2013  . OP (osteoporosis) 03/11/2013  . 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 Esophagitis, reflux 03/11/2013  . Neuralgia neuritis, sciatic nerve 03/11/2013  . Encounter for screening for eye and ear disorders 03/11/2013  . MGUS (monoclonal gammopathy of unknown significance) 11/01/2011    Past Medical History:  Diagnosis Date  . High cholesterol   . Hypertension   . Kyphosis   . PMR (polymyalgia rheumatica) ([facility])   . Polio   . Scoliosis   . Vitiligo  Family History  Problem Relation Age of Onset  . Diabetes Mother   . Cancer Brother    Past Surgical History:  Procedure Laterality Date  . ABDOMINAL HYSTERECTOMY  1993  . SHOULDER FUSION SURGERY Left    took a piece of  hip and fused it into left shoulder   Social History   Social History Narrative  . Not on file     Objective: Vital Signs: BP (!) 114/56 (BP Location: Right Arm, Patient Position: Sitting, Cuff Size: Large)   Pulse 64   Resp 13   Ht 5' 1.5" (1.562 m)   Wt 106 lb (48.1 kg)   BMI 19.70 kg/m    Physical Exam  Constitutional: She is oriented to person, place, and time. She appears well-developed and well-nourished.  HENT:  Head: Normocephalic and atraumatic.  Eyes: Conjunctivae and EOM are normal.  Neck: Normal range of motion.  Cardiovascular: Normal rate, regular rhythm, normal heart sounds and intact distal pulses.  Pulmonary/Chest: Effort normal and breath sounds normal.  Abdominal: Soft. Bowel sounds are normal.  Lymphadenopathy:    She has no cervical adenopathy.  Neurological: She is alert and oriented to person, place, and time.  Skin: Skin is warm and dry. Capillary refill takes less than 2 seconds.  Psychiatric: She has a normal mood and affect. Her behavior is normal.  Nursing note and vitals reviewed.    Musculoskeletal Exam: She has limited range of motion of cervical thoracic and lumbar spine.  Shoulder joints limited range of motion.  She has tenderness of her right wrist joint.  She has PIP and DIP and CMC thickening without any synovitis.  Hip joints knee joints ankles MTPs PIPs were in good range of motion.  CDAI Exam: CDAI Homunculus Exam:   Tenderness:  RUE: wrist  Joint Counts:  CDAI Tender Joint count: 1 CDAI Swollen Joint count: 0  Global Assessments:  Patient Global Assessment: 4 Provider Global Assessment: 4  CDAI Calculated Score: 9    Investigation: No additional findings. CBC Latest Ref Rng & Units 08/07/2017 01/27/2017 01/01/2017  WBC 3.8 - 10.8 Thousand/uL 6.3 4.9 4.8  Hemoglobin 11.7 - 15.5 g/dL 14.6 13.6 12.8  Hematocrit 35.0 - 45.0 % 41.8 40.1 38.2  Platelets 140 - 400 Thousand/uL 171 229 212   CMP Latest Ref Rng & Units  08/01/2017 01/27/2017 01/01/2017  Glucose 65 - 99 mg/dL 85 73 119(H)  BUN 7 - 25 mg/dL _0 Creatinine 0.60 - 0.93 mg/dL 0.55(L) 0.60 0.61  Sodium 135 - 146 mmol/L 131(L) 136 135  Potassium 3.5 - 5.3 mmol/L 4.3 5.6(H) 4.8  Chloride 98 - 110 mmol/L 94(L) 100 99  CO2 20 - 32 mmol/L _1 Calcium 8.6 - 10.4 mg/dL 9.4 9.5 9.6  Total Protein 6.1 - 8.1 g/dL 6.6 7.1 6.6  Total Bilirubin 0.2 - 1.2 mg/dL 0.4 0.5 0.5  Alkaline Phos 33 - 130 U/L - - -  AST 10 - 35 U/L _2 ALT 6 - 29 U/L _3 UA negative, CK 73, uric acid 4.7, RF negative, anti-CCP negative, ANA negative, G6PD normal, ESR 50  Imaging: Xr Foot 2 Views Left  Result Date: 08/07/2017 PIP/DIP narrowing was noted.  No MTP joint narrowing or erosive changes were noted.  No intertarsal joint space narrowing was noted.  Posterior calcaneal spur was noted. Impression: These findings are consistent with osteoarthritis.   Xr Foot 2 Views Right  Result Date: 08/07/2017 PIP/DIP  narrowing was noted.  No MTP joint narrowing or erosive changes were noted.  No intertarsal joint space narrowing was noted.  Posterior calcaneal spur was noted. Impression: These findings are consistent with osteoarthritis.   Xr Hand 2 View Left  Result Date: 08/07/2017 PIP/DIP and CMC narrowing was noted.  MCP or intercarpal joint space changes were noted.  No erosive changes were noted. Impression: These findings consistent with osteoarthritis of the hand.  Xr Hand 2 View Right  Result Date: 08/07/2017 PIP/DIP and CMC narrowing was noted.  MCP or intercarpal joint space changes were noted.  No erosive changes were noted. Impression: These findings consistent with osteoarthritis of the hand   Speciality Comments: No specialty comments available.    Procedures:  No procedures performed Allergies: Other and Tetracyclines & related   Assessment / Plan:     Visit Diagnoses: Polymyalgia rheumatica ([place]) -she was in remission since 2014.  She had  recent flare with increased muscle weakness and discomfort.  She also had some inflammatory arthritis involving her hands.  Her labs recently showed sed rate of 50.  All other autoimmune work-up was negative.  We had detailed discussion regarding the use of prednisone.  I placed her on prednisone 5 mg p.o. daily last visit which is helped her but not completely resolved her symptoms.  I will increase her prednisone to 10 mg p.o. daily.  Indications side effects contraindications were discussed.  She is taking Plaquenil in the past as a steroid sparing agent.  I will place her on Plaquenil again at 200 mg p.o. daily.  Side effects indications contraindications were discussed at length.  She will taper prednisone by 1 mg every month.  Patient was counseled on the purpose, proper use, and adverse effects of hydroxychloroquine including nausea/diarrhea, skin rash, headaches, and sun sensitivity.  Discussed importance of annual eye exams while on hydroxychloroquine to monitor to ocular toxicity and discussed importance of frequent laboratory monitoring.  Provided patient with eye exam form for baseline ophthalmologic exam.  Provided patient with educational materials on hydroxychloroquine and answered all questions.  Patient consented to hydroxychloroquine.  Will upload consent in the media tab.      Primary osteoarthritis of both hands - RF -, CCP-.  She does have osteoarthritis in her hands which causes chronic pain and discomfort.  Primary osteoarthritis of both feet  Positive ANA (antinuclear antibody) - SSA +  Age-related osteoporosis without current pathological fracture - Prolia. Her last DEXA scan was on 04/23/2017 revealed a T score of -2.4. - Plan: denosumab (PROLIA) injection 60 mg every 6 months.  She had a Prolia injection today.  Other medical problems are listed as follows:  Scoliosis  History of humerus fracture  History of hypertension  History of poliomyelitis  MGUS (monoclonal  gammopathy of unknown significance)  History of gastroesophageal reflux (GERD)  Dyslipidemia  Vitiligo  Other fatigue  High risk medication use    Orders: Orders Placed This Encounter  Procedures  . Ambulatory referral to Physical Therapy   Meds ordered this encounter  Medications  . denosumab (PROLIA) injection 60 mg  . hydroxychloroquine (PLAQUENIL) 200 MG tablet    Sig: Take 1 tablet (200 mg total) by mouth daily.    Dispense:  30 tablet    Refill:  2  . predniSONE (DELTASONE) 5 MG tablet    Sig: Take 2 tablets (10 mg total) by mouth daily with breakfast. Taper by 1 mg every month.    Dispense:  60 tablet  Refill:  0    Face-to-face time spent with patient was 30 minutes. >50% of time was spent in counseling and coordination of care.  Follow-Up Instructions: Return for PMR, Osteoarthritis, Osteoporosis.   Bo Merino, MD  Note - This record has been created using Editor, commissioning.  Chart creation errors have been sought, but may not always  have been located. Such creation errors do not reflect on  the standard of medical care.

## 2017-08-11 ENCOUNTER — Ambulatory Visit: Payer: Medicare Other | Admitting: Rheumatology

## 2017-08-11 ENCOUNTER — Encounter: Payer: Self-pay | Admitting: Physician Assistant

## 2017-08-11 ENCOUNTER — Ambulatory Visit: Payer: Medicare Other

## 2017-08-11 VITALS — BP 114/56 | HR 64 | Resp 13 | Ht 61.5 in | Wt 106.0 lb

## 2017-08-11 DIAGNOSIS — D472 Monoclonal gammopathy: Secondary | ICD-10-CM

## 2017-08-11 DIAGNOSIS — M810 Age-related osteoporosis without current pathological fracture: Secondary | ICD-10-CM | POA: Diagnosis not present

## 2017-08-11 DIAGNOSIS — R5383 Other fatigue: Secondary | ICD-10-CM

## 2017-08-11 DIAGNOSIS — Z8781 Personal history of (healed) traumatic fracture: Secondary | ICD-10-CM

## 2017-08-11 DIAGNOSIS — L80 Vitiligo: Secondary | ICD-10-CM

## 2017-08-11 DIAGNOSIS — Z79899 Other long term (current) drug therapy: Secondary | ICD-10-CM

## 2017-08-11 DIAGNOSIS — M19071 Primary osteoarthritis, right ankle and foot: Secondary | ICD-10-CM

## 2017-08-11 DIAGNOSIS — E785 Hyperlipidemia, unspecified: Secondary | ICD-10-CM | POA: Diagnosis not present

## 2017-08-11 DIAGNOSIS — Y93B9 Activity, other involving muscle strengthening exercises: Secondary | ICD-10-CM

## 2017-08-11 DIAGNOSIS — Z8719 Personal history of other diseases of the digestive system: Secondary | ICD-10-CM | POA: Diagnosis not present

## 2017-08-11 DIAGNOSIS — Z8612 Personal history of poliomyelitis: Secondary | ICD-10-CM | POA: Diagnosis not present

## 2017-08-11 DIAGNOSIS — R768 Other specified abnormal immunological findings in serum: Secondary | ICD-10-CM | POA: Diagnosis not present

## 2017-08-11 DIAGNOSIS — M19072 Primary osteoarthritis, left ankle and foot: Secondary | ICD-10-CM

## 2017-08-11 DIAGNOSIS — M41125 Adolescent idiopathic scoliosis, thoracolumbar region: Secondary | ICD-10-CM

## 2017-08-11 DIAGNOSIS — Z8679 Personal history of other diseases of the circulatory system: Secondary | ICD-10-CM | POA: Diagnosis not present

## 2017-08-11 DIAGNOSIS — M19041 Primary osteoarthritis, right hand: Secondary | ICD-10-CM | POA: Diagnosis not present

## 2017-08-11 DIAGNOSIS — M353 Polymyalgia rheumatica: Secondary | ICD-10-CM | POA: Diagnosis not present

## 2017-08-11 DIAGNOSIS — M19042 Primary osteoarthritis, left hand: Secondary | ICD-10-CM

## 2017-08-11 DIAGNOSIS — R7689 Other specified abnormal immunological findings in serum: Secondary | ICD-10-CM

## 2017-08-11 LAB — URINALYSIS, ROUTINE W REFLEX MICROSCOPIC
BILIRUBIN URINE: NEGATIVE
Bacteria, UA: NONE SEEN /HPF
GLUCOSE, UA: NEGATIVE
HGB URINE DIPSTICK: NEGATIVE
NITRITE: NEGATIVE
Protein, ur: NEGATIVE
Specific Gravity, Urine: 1.02 (ref 1.001–1.03)
pH: 6 (ref 5.0–8.0)

## 2017-08-11 LAB — CBC WITH DIFFERENTIAL/PLATELET
Basophils Absolute: 19 cells/uL (ref 0–200)
Basophils Relative: 0.3 %
Eosinophils Absolute: 19 cells/uL (ref 15–500)
Eosinophils Relative: 0.3 %
HCT: 41.8 % (ref 35.0–45.0)
Hemoglobin: 14.6 g/dL (ref 11.7–15.5)
Lymphs Abs: 1052 cells/uL (ref 850–3900)
MCH: 29.5 pg (ref 27.0–33.0)
MCHC: 34.9 g/dL (ref 32.0–36.0)
MCV: 84.4 fL (ref 80.0–100.0)
MONOS PCT: 6.8 %
MPV: 9.1 fL (ref 7.5–12.5)
NEUTROS PCT: 75.9 %
Neutro Abs: 4782 cells/uL (ref 1500–7800)
PLATELETS: 171 10*3/uL (ref 140–400)
RBC: 4.95 10*6/uL (ref 3.80–5.10)
RDW: 12.1 % (ref 11.0–15.0)
TOTAL LYMPHOCYTE: 16.7 %
WBC mixed population: 428 cells/uL (ref 200–950)
WBC: 6.3 10*3/uL (ref 3.8–10.8)

## 2017-08-11 LAB — GLUCOSE 6 PHOSPHATE DEHYDROGENASE: G-6PDH: 12.2 U/g{Hb} (ref 7.0–20.5)

## 2017-08-11 LAB — RHEUMATOID FACTOR: Rhuematoid fact SerPl-aCnc: <14 IU/mL (ref <14)

## 2017-08-11 LAB — CK: Total CK: 73 U/L (ref 29–143)

## 2017-08-11 LAB — SEDIMENTATION RATE: Sed Rate: 50 mm/h — ABNORMAL HIGH (ref 0–30)

## 2017-08-11 LAB — 14-3-3 ETA PROTEIN: 14-3-3 eta Protein: <0.2 ng/mL (ref <0.2)

## 2017-08-11 LAB — URIC ACID: Uric Acid, Serum: 4.7 mg/dL (ref 2.5–7.0)

## 2017-08-11 LAB — CYCLIC CITRUL PEPTIDE ANTIBODY, IGG: Cyclic Citrullin Peptide Ab: <16 UNITS

## 2017-08-11 LAB — ANA: Anti Nuclear Antibody(ANA): NEGATIVE

## 2017-08-11 MED ORDER — HYDROXYCHLOROQUINE SULFATE 200 MG PO TABS: 200.0000 mg | ORAL_TABLET | Freq: Every day | ORAL | 2 refills | Status: DC

## 2017-08-11 MED ORDER — DENOSUMAB 60 MG/ML ~~LOC~~ SOSY
60.0000 mg | PREFILLED_SYRINGE | Freq: Once | SUBCUTANEOUS | Status: AC
  Administered 2017-08-11: 60 mg via SUBCUTANEOUS

## 2017-08-11 MED ORDER — PREDNISONE 5 MG PO TABS: 10.0000 mg | ORAL_TABLET | Freq: Every day | ORAL | 0 refills | Status: DC

## 2017-08-11 NOTE — Progress Notes (Signed)
Patient in the office for a Prolia injection. Patient was given injection in left upper arm. Patient tolerated injection well.   Administrations This Visit    denosumab (PROLIA) injection 60 mg    Admin Date 08/11/2017 Action Given Dose 60 mg Route Subcutaneous Administered By Carole Binning, LPN

## 2017-08-11 NOTE — Patient Instructions (Addendum)
Hydroxychloroquine tablets What is this medicine? HYDROXYCHLOROQUINE (hye drox ee KLOR oh kwin) is used to treat rheumatoid arthritis and systemic lupus erythematosus. It is also used to treat malaria. This medicine may be used for other purposes; ask your health care provider or pharmacist if you have questions. COMMON BRAND NAME(S): Plaquenil, Quineprox What should I tell my health care provider before I take this medicine? They need to know if you have any of these conditions: -diabetes -eye disease, vision problems -G6PD deficiency -history of blood diseases -history of irregular heartbeat -if you often drink alcohol -kidney disease -liver disease -porphyria -psoriasis -seizures -an unusual or allergic reaction to chloroquine, hydroxychloroquine, other medicines, foods, dyes, or preservatives -pregnant or trying to get pregnant -breast-feeding How should I use this medicine? Take this medicine by mouth with a glass of water. Follow the directions on the prescription label. Avoid taking antacids within 4 hours of taking this medicine. It is best to separate these medicines by at least 4 hours. Do not cut, crush or chew this medicine. You can take it with or without food. If it upsets your stomach, take it with food. Take your medicine at regular intervals. Do not take your medicine more often than directed. Take all of your medicine as directed even if you think you are better. Do not skip doses or stop your medicine early. Talk to your pediatrician regarding the use of this medicine in children. While this drug may be prescribed for selected conditions, precautions do apply. Overdosage: If you think you have taken too much of this medicine contact a poison control center or emergency room at once. NOTE: This medicine is only for you. Do not share this medicine with others. What if I miss a dose? If you miss a dose, take it as soon as you can. If it is almost time for your next dose,  take only that dose. Do not take double or extra doses. What may interact with this medicine? Do not take this medicine with any of the following medications: -cisapride -dofetilide -dronedarone -live virus vaccines -penicillamine -pimozide -thioridazine -ziprasidone This medicine may also interact with the following medications: -ampicillin -antacids -cimetidine -cyclosporine -digoxin -medicines for diabetes, like insulin, glipizide, glyburide -medicines for seizures like carbamazepine, phenobarbital, phenytoin -mefloquine -methotrexate -other medicines that prolong the QT interval (cause an abnormal heart rhythm) -praziquantel This list may not describe all possible interactions. Give your health care provider a list of all the medicines, herbs, non-prescription drugs, or dietary supplements you use. Also tell them if you smoke, drink alcohol, or use illegal drugs. Some items may interact with your medicine. What should I watch for while using this medicine? Tell your doctor or healthcare professional if your symptoms do not start to get better or if they get worse. Avoid taking antacids within 4 hours of taking this medicine. It is best to separate these medicines by at least 4 hours. Tell your doctor or health care professional right away if you have any change in your eyesight. Your vision and blood may be tested before and during use of this medicine. This medicine can make you more sensitive to the sun. Keep out of the sun. If you cannot avoid being in the sun, wear protective clothing and use sunscreen. Do not use sun lamps or tanning beds/booths. What side effects may I notice from receiving this medicine? Side effects that you should report to your doctor or health care professional as soon as possible: -allergic reactions like skin  rash, itching or hives, swelling of the face, lips, or tongue -changes in vision -decreased hearing or ringing of the ears -redness,  blistering, peeling or loosening of the skin, including inside the mouth -seizures -sensitivity to light -signs and symptoms of a dangerous change in heartbeat or heart rhythm like chest pain; dizziness; fast or irregular heartbeat; palpitations; feeling faint or lightheaded, falls; breathing problems -signs and symptoms of liver injury like dark yellow or brown urine; general ill feeling or flu-like symptoms; light-colored stools; loss of appetite; nausea; right upper belly pain; unusually weak or tired; yellowing of the eyes or skin -signs and symptoms of low blood sugar such as feeling anxious; confusion; dizziness; increased hunger; unusually weak or tired; sweating; shakiness; cold; irritable; headache; blurred vision; fast heartbeat; loss of consciousness -uncontrollable head, mouth, neck, arm, or leg movements Side effects that usually do not require medical attention (report to your doctor or health care professional if they continue or are bothersome): -anxious -diarrhea -dizziness -hair loss -headache -irritable -loss of appetite -nausea, vomiting -stomach pain This list may not describe all possible side effects. Call your doctor for medical advice about side effects. You may report side effects to FDA at 1-800-FDA-1088. Where should I keep my medicine? Keep out of the reach of children. In children, this medicine can cause overdose with small doses. Store at room temperature between 15 and 30 degrees C (59 and 86 degrees F). Protect from moisture and light. Throw away any unused medicine after the expiration date. NOTE: This sheet is a summary. It may not cover all possible information. If you have questions about this medicine, talk to your doctor, pharmacist, or health care provider.  2018 Elsevier/Gold Standard (2015-11-01 14:16:15) Standing Labs We placed an order today for your standing lab work.    Please come back and get your standing labs in 1 month and then every 3  months  We have open lab Monday through Friday from 8:30-11:30 AM and 1:30-4:00 PM  at the office of Dr. Bo Merino.   You may experience shorter wait times on Monday and Friday afternoons. The office is located at [address] [place], [place], [place] [address] No appointment is necessary.   Labs are drawn by [facility].  You may receive a bill from [facility] for your lab work. If you have any questions regarding directions or hours of operation,  please call [phone].

## 2017-08-22 ENCOUNTER — Other Ambulatory Visit: Payer: Self-pay

## 2017-08-22 DIAGNOSIS — Z5181 Encounter for therapeutic drug level monitoring: Secondary | ICD-10-CM

## 2017-08-22 LAB — COMPLETE METABOLIC PANEL WITH GFR
AG RATIO: 1.6 (calc) (ref 1.0–2.5)
ALBUMIN MSPROF: 4.4 g/dL (ref 3.6–5.1)
ALT: 14 U/L (ref 6–29)
AST: 16 U/L (ref 10–35)
Alkaline phosphatase (APISO): 49 U/L (ref 33–130)
BILIRUBIN TOTAL: 0.8 mg/dL (ref 0.2–1.2)
BUN: 17 mg/dL (ref 7–25)
CALCIUM: 9.3 mg/dL (ref 8.6–10.4)
CHLORIDE: 98 mmol/L (ref 98–110)
CO2: 25 mmol/L (ref 20–32)
Creat: 0.67 mg/dL (ref 0.60–0.93)
GFR, EST AFRICAN AMERICAN: 97 mL/min/{1.73_m2} (ref >=60)
GFR, EST NON AFRICAN AMERICAN: 84 mL/min/{1.73_m2} (ref >=60)
Globulin: 2.7 g/dL (calc) (ref 1.9–3.7)
Glucose, Bld: 127 mg/dL — ABNORMAL HIGH (ref 65–99)
POTASSIUM: 4.2 mmol/L (ref 3.5–5.3)
Sodium: 133 mmol/L — ABNORMAL LOW (ref 135–146)
TOTAL PROTEIN: 7.1 g/dL (ref 6.1–8.1)

## 2017-08-26 NOTE — Progress Notes (Signed)
Glucose is 127.  All other labs are WNL.

## 2017-09-10 ENCOUNTER — Telehealth: Payer: Self-pay | Admitting: Rheumatology

## 2017-09-10 ENCOUNTER — Other Ambulatory Visit: Payer: Self-pay

## 2017-09-10 DIAGNOSIS — Z5181 Encounter for therapeutic drug level monitoring: Secondary | ICD-10-CM

## 2017-09-10 MED ORDER — PREDNISONE 1 MG PO TABS: 4.0000 mg | ORAL_TABLET | Freq: Every day | ORAL | 0 refills | Status: DC

## 2017-09-10 NOTE — Telephone Encounter (Signed)
Last Visit: 08/11/17 Next Visit: 11/25/17  Okay to refill per Dr. Deveshwar  

## 2017-09-10 NOTE — Telephone Encounter (Signed)
Patient requesting medication adjustment for Prednisone to 9mg  from 10mg . Please send in rx for 1mg  to [facility] on [address].

## 2017-09-11 LAB — COMPLETE METABOLIC PANEL WITH GFR
AG RATIO: 1.9 (calc) (ref 1.0–2.5)
ALT: 16 U/L (ref 6–29)
AST: 17 U/L (ref 10–35)
Albumin: 4.3 g/dL (ref 3.6–5.1)
Alkaline phosphatase (APISO): 41 U/L (ref 33–130)
BILIRUBIN TOTAL: 0.6 mg/dL (ref 0.2–1.2)
BUN: 17 mg/dL (ref 7–25)
CO2: 32 mmol/L (ref 20–32)
Calcium: 9.8 mg/dL (ref 8.6–10.4)
Chloride: 95 mmol/L — ABNORMAL LOW (ref 98–110)
Creat: 0.64 mg/dL (ref 0.60–0.93)
GFR, Est African American: 98 mL/min/{1.73_m2} (ref >=60)
GFR, Est Non African American: 85 mL/min/{1.73_m2} (ref >=60)
GLOBULIN: 2.3 g/dL (ref 1.9–3.7)
Glucose, Bld: 85 mg/dL (ref 65–99)
POTASSIUM: 5.3 mmol/L (ref 3.5–5.3)
SODIUM: 133 mmol/L — AB (ref 135–146)
Total Protein: 6.6 g/dL (ref 6.1–8.1)

## 2017-09-11 NOTE — Progress Notes (Signed)
stable °

## 2017-10-06 ENCOUNTER — Other Ambulatory Visit: Payer: Self-pay | Admitting: Rheumatology

## 2017-10-07 NOTE — Telephone Encounter (Signed)
Last Visit: 08/11/17 Next Visit: 11/25/17  Okay to refill per Dr. Estanislado Pandy

## 2017-11-04 ENCOUNTER — Other Ambulatory Visit: Payer: Self-pay | Admitting: Rheumatology

## 2017-11-05 NOTE — Progress Notes (Signed)
Office Visit Note  Patient: Ellen Thomas             Date of Birth: Sep 04, 1935           MRN: [id number]             PCP: Bernerd Limbo, MD Referring: Bernerd Limbo, MD Visit Date: 11/12/2017 Occupation: @GUAROCC @  Subjective:  Discuss prednisone use   History of Present Illness: Ellen Thomas is a 82 y.o. female with history of PMR.  She was started on PLQ 200 mg 1 tablet every other day in May 2019.  She is on Prednisone 7 mg po daily.  She is tapering by 1 mg every month.  She denies any joint pain or joint swelling.  She denies any joint stiffness. She denies any muscle aches or muscle tenderness.  She denies any muscle weakness.  She denies any recent headaches or blurry vision.  She reports that since being on prednisone she has been very jittery.  She states that she has eliminated most sugar and carbs from her diet.  She states she is also noticed side effect of voice hoarseness. She states for the past 2 weeks she has had some urinary urgency but denies any dysuria.  She states that she has not been evaluated by her PCP for these symptoms.   Activities of Daily Living:  Patient reports morning stiffness for 0 none.   Patient Denies nocturnal pain.  Difficulty dressing/grooming: Denies Difficulty climbing stairs: Denies Difficulty getting out of chair: Denies Difficulty using hands for taps, buttons, cutlery, and/or writing: Denies  Review of Systems  Constitutional: Positive for fatigue.  HENT: Positive for mouth dryness. Negative for mouth sores and nose dryness.   Eyes: Negative for pain, visual disturbance and dryness.  Respiratory: Negative for cough, hemoptysis, shortness of breath and difficulty breathing.   Cardiovascular: Negative for chest pain, palpitations, hypertension and swelling in legs/feet.  Gastrointestinal: Negative for blood in stool, constipation and diarrhea.  Endocrine: Negative for increased urination.  Genitourinary: Positive for  difficulty urinating. Negative for painful urination.  Musculoskeletal: Negative for arthralgias, joint pain, joint swelling, myalgias, muscle weakness, morning stiffness, muscle tenderness and myalgias.  Skin: Negative for color change, pallor, rash, hair loss, nodules/bumps, skin tightness, ulcers and sensitivity to sunlight.  Allergic/Immunologic: Negative for susceptible to infections.  Neurological: Negative for dizziness, numbness, headaches and weakness.  Hematological: Negative for bruising/bleeding tendency and swollen glands.  Psychiatric/Behavioral: Negative for depressed mood and sleep disturbance. The patient is not nervous/anxious.     PMFS History:  Patient Active Problem List   Diagnosis Date Noted  . History of humerus fracture 04/23/2016  . History of gastroesophageal reflux (GERD) 04/23/2016  . Positive ANA (antinuclear antibody) 04/23/2016  . History of poliomyelitis 04/23/2016  . Vitiligo 04/23/2016  . Scoliosis 04/23/2016  . Foreign body granuloma of skin and subcutaneous tissue 11/16/2015  . At risk for falling 03/24/2015  . Bronchitis 03/10/2014  . Encounter for general adult medical examination without abnormal findings 02/13/2014  . BMI (body mass index), pediatric, 5% to less than 85% for age 18/07/2013  . Hypercholesterolemia 04/05/2013  . Benign hypertension 04/05/2013  . Polymyalgia rheumatica ([place]) 04/05/2013  . Other specified health status 04/05/2013  . Disease of skin and subcutaneous tissue 03/11/2013  . OP (osteoporosis) 03/11/2013  . 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 Esophagitis, reflux 03/11/2013  . Neuralgia neuritis, sciatic nerve 03/11/2013  . Encounter for screening for eye and ear disorders 03/11/2013  .  MGUS (monoclonal gammopathy of unknown significance) 11/01/2011    Past Medical History:  Diagnosis Date  . High cholesterol   . Hypertension   . Kyphosis   . PMR (polymyalgia rheumatica) (HCC)   . Polio     . Scoliosis   . Vitiligo     Family History  Problem Relation Age of Onset  . Diabetes Mother   . Cancer Brother    Past Surgical History:  Procedure Laterality Date  . ABDOMINAL HYSTERECTOMY  1993  . SHOULDER FUSION SURGERY Left    took a piece of hip and fused it into left shoulder  . TOTAL SHOULDER ARTHROPLASTY     Social History   Social History Narrative  . Not on file    Objective: Vital Signs: BP 137/64 (BP Location: Right Arm, Patient Position: Sitting, Cuff Size: Normal)   Pulse 69   Resp 16   Ht 5' 1.5" (1.562 m)   Wt 105 lb 9.6 oz (47.9 kg)   BMI 19.63 kg/m    Physical Exam  Constitutional: She is oriented to person, place, and time. She appears well-developed and well-nourished.  HENT:  Head: Normocephalic and atraumatic.  Eyes: Conjunctivae and EOM are normal.  Neck: Normal range of motion.  Cardiovascular: Normal rate, regular rhythm, normal heart sounds and intact distal pulses.  Pulmonary/Chest: Effort normal and breath sounds normal.  Abdominal: Soft. Bowel sounds are normal.  Lymphadenopathy:    She has no cervical adenopathy.  Neurological: She is alert and oriented to person, place, and time.  Skin: Skin is warm and dry. Capillary refill takes less than 2 seconds.  Psychiatric: She has a normal mood and affect. Her behavior is normal.  Nursing note and vitals reviewed.    Musculoskeletal Exam: Limited range of motion of C-spine, thoracic spine, lumbar spine.  Limited range of motion of bilateral shoulder joints.  She has PIP and DIP synovial thickening consistent with osteoarthritis of bilateral hands.  She has bilateral CMC joint synovial thickening.  No synovitis was noted.  Hip joints, knee joints, ankle joints, MTPs, PIPs, DIPs good range of motion with no synovitis.  No warmth or effusion of bilateral knee joints.  She has full strength of upper and lower extremities.  CDAI Exam: No CDAI exam completed.   Investigation: No additional  findings.  Imaging: No results found.  Recent Labs: Lab Results  Component Value Date   WBC 6.3 08/07/2017   HGB 14.6 08/07/2017   PLT 171 08/07/2017   NA 133 (L) 09/10/2017   K 5.3 09/10/2017   CL 95 (L) 09/10/2017   CO2 32 09/10/2017   GLUCOSE 85 09/10/2017   BUN 17 09/10/2017   CREATININE 0.64 09/10/2017   BILITOT 0.6 09/10/2017   ALKPHOS 51 07/02/2016   AST 17 09/10/2017   ALT 16 09/10/2017   PROT 6.6 09/10/2017   ALBUMIN 3.9 07/02/2016   CALCIUM 9.8 09/10/2017   GFRAA 98 09/10/2017    Speciality Comments: No specialty comments available.  Procedures:  No procedures performed Allergies: Other and Tetracyclines & related   Assessment / Plan:     Visit Diagnoses: Polymyalgia rheumatica ([place]): She had a recent flare of PMR and was placed on prednisone 10 mg p.o. daily.  She has been tapering by 1 mg every month.  She is currently taking prednisone 7 mg by mouth daily.  She is also is restarted on Plaquenil 200 mg 1 tablet every other day.  She has full muscle strength of upper and  lower extremities with no discomfort.  She has no muscle aches or muscle tenderness at this time.  She has not had any recent headaches or blurry vision.  She has no synovitis on exam today.  No joint pain or joint swelling.  She will continue on the current treatment regimen of prednisone 7 mg by mouth daily tapering by 1 mg every month and Plaquenil 200 mg 1 tablet every other day.  CMP and hemoglobin A1c was checked today due to the patient's concern for elevated blood glucose level.  She has been trying to eliminate sugar as well as carbs from her diet.  She denies any side effects of Plaquenil.  She does not need any refills at this time.  She is advised to notify us if she develops any new or worsening symptoms.  High risk medication use - PLQ 200 mg 1 tablet po every other day, prednisone 7 mg p.o. tapering by 1 mg every month.  CMP was ordered today to assess blood glucose level.- Plan:  COMPLETE METABOLIC PANEL WITH GFR  Primary osteoarthritis of both hands - RF -, CCP-: She has PIP and DIP synovial thickening consistent with osteoarthritis of bilateral hands.  Bilateral CMC joint synovial thickening noted.  She has no synovitis on exam.  She has no discomfort at this time.  Joint protection and muscle strengthening were discussed.  Primary osteoarthritis of both feet: She has osteoarthritic changes in bilateral feet.  She has no discomfort in her feet at this time.  She wears proper fitting shoes.  Positive ANA (antinuclear antibody) - SSA +  Age-related osteoporosis without current pathological fracture - DEXA scan was on 04/23/2017 revealed a T score of -2.4  Urinary urgency -She has been experiencing urinary urgency for the past 2 weeks.  No dysuria.  UA was ordered today.  If the UA reveals findings consistent with UTI she will need to follow-up with her PCP to have a urine culture and receive treatment.  Plan: Urinalysis, Routine w reflex microscopic  Screening for diabetes mellitus (DM) -patient is concerned that her blood glucose level has elevated due to being on long-term prednisone.  CMP and hemoglobin A1c were ordered today.  Plan: HgB A1c   Other medical conditions are listed as follows:   Scoliosis  Other fatigue  Vitiligo  History of humerus fracture  History of poliomyelitis  History of hypertension  History of gastroesophageal reflux (GERD)  Dyslipidemia  MGUS (monoclonal gammopathy of unknown significance)   Orders: Orders Placed This Encounter  Procedures  . Urinalysis, Routine w reflex microscopic  . COMPLETE METABOLIC PANEL WITH GFR  . HgB A1c   No orders of the defined types were placed in this encounter.   Face-to-face time spent with patient was 30 minutes. Greater than 50% of time was spent in counseling and coordination of care.  Follow-Up Instructions: Return in about 5 months (around 04/14/2018) for Polymyalgia Rheumatica,  Osteoarthritis, Osteoporosis.   Ofilia Neas, PA-C   I examined and evaluated the patient with Hazel Sams PA.  Patient is doing better on prednisone and Plaquenil combination.  She had no muscular weakness or tenderness on examination today.  She is on Prolia for osteoporosis.  We will obtain labs to monitor treatment.  The plan of care was discussed as noted above.  Bo Merino, MD  Note - This record has been created using Editor, commissioning.  Chart creation errors have been sought, but may not always  have been located. Such creation  errors do not reflect on  the standard of medical care.

## 2017-11-05 NOTE — Telephone Encounter (Signed)
Last Visit: 08/11/17 Next Visit: 11/25/17  Okay to refill per Dr. Estanislado Pandy

## 2017-11-10 ENCOUNTER — Other Ambulatory Visit: Payer: Self-pay | Admitting: Rheumatology

## 2017-11-10 NOTE — Telephone Encounter (Signed)
Last visit: 08/11/2017 Next visit: 11/12/2017  Tapering by 1mg  every month.   Okay to refill per Dr. Estanislado Pandy.

## 2017-11-12 ENCOUNTER — Ambulatory Visit: Payer: Medicare Other | Admitting: Rheumatology

## 2017-11-12 ENCOUNTER — Encounter: Payer: Self-pay | Admitting: Rheumatology

## 2017-11-12 VITALS — BP 137/64 | HR 69 | Resp 16 | Ht 61.5 in | Wt 105.6 lb

## 2017-11-12 DIAGNOSIS — M19071 Primary osteoarthritis, right ankle and foot: Secondary | ICD-10-CM

## 2017-11-12 DIAGNOSIS — Z8612 Personal history of poliomyelitis: Secondary | ICD-10-CM

## 2017-11-12 DIAGNOSIS — L80 Vitiligo: Secondary | ICD-10-CM

## 2017-11-12 DIAGNOSIS — Z131 Encounter for screening for diabetes mellitus: Secondary | ICD-10-CM

## 2017-11-12 DIAGNOSIS — Z79899 Other long term (current) drug therapy: Secondary | ICD-10-CM | POA: Diagnosis not present

## 2017-11-12 DIAGNOSIS — R5383 Other fatigue: Secondary | ICD-10-CM

## 2017-11-12 DIAGNOSIS — D472 Monoclonal gammopathy: Secondary | ICD-10-CM

## 2017-11-12 DIAGNOSIS — Z8719 Personal history of other diseases of the digestive system: Secondary | ICD-10-CM

## 2017-11-12 DIAGNOSIS — M353 Polymyalgia rheumatica: Secondary | ICD-10-CM

## 2017-11-12 DIAGNOSIS — M19041 Primary osteoarthritis, right hand: Secondary | ICD-10-CM

## 2017-11-12 DIAGNOSIS — M19042 Primary osteoarthritis, left hand: Secondary | ICD-10-CM

## 2017-11-12 DIAGNOSIS — Z8679 Personal history of other diseases of the circulatory system: Secondary | ICD-10-CM

## 2017-11-12 DIAGNOSIS — Z8781 Personal history of (healed) traumatic fracture: Secondary | ICD-10-CM

## 2017-11-12 DIAGNOSIS — M41125 Adolescent idiopathic scoliosis, thoracolumbar region: Secondary | ICD-10-CM

## 2017-11-12 DIAGNOSIS — R3915 Urgency of urination: Secondary | ICD-10-CM

## 2017-11-12 DIAGNOSIS — M19072 Primary osteoarthritis, left ankle and foot: Secondary | ICD-10-CM

## 2017-11-12 DIAGNOSIS — R7689 Other specified abnormal immunological findings in serum: Secondary | ICD-10-CM

## 2017-11-12 DIAGNOSIS — R768 Other specified abnormal immunological findings in serum: Secondary | ICD-10-CM

## 2017-11-12 DIAGNOSIS — M810 Age-related osteoporosis without current pathological fracture: Secondary | ICD-10-CM

## 2017-11-12 DIAGNOSIS — E785 Hyperlipidemia, unspecified: Secondary | ICD-10-CM

## 2017-11-13 LAB — COMPLETE METABOLIC PANEL WITH GFR
AG Ratio: 1.8 (calc) (ref 1.0–2.5)
ALT: 17 U/L (ref 6–29)
AST: 17 U/L (ref 10–35)
Albumin: 4.6 g/dL (ref 3.6–5.1)
Alkaline phosphatase (APISO): 36 U/L (ref 33–130)
BUN: 17 mg/dL (ref 7–25)
CALCIUM: 9.8 mg/dL (ref 8.6–10.4)
CO2: 28 mmol/L (ref 20–32)
Chloride: 92 mmol/L — ABNORMAL LOW (ref 98–110)
Creat: 0.62 mg/dL (ref 0.60–0.93)
GFR, EST NON AFRICAN AMERICAN: 86 mL/min/{1.73_m2} (ref >=60)
GFR, Est African American: 99 mL/min/{1.73_m2} (ref >=60)
GLUCOSE: 87 mg/dL (ref 65–99)
Globulin: 2.5 g/dL (calc) (ref 1.9–3.7)
Potassium: 4.5 mmol/L (ref 3.5–5.3)
SODIUM: 131 mmol/L — AB (ref 135–146)
TOTAL PROTEIN: 7.1 g/dL (ref 6.1–8.1)
Total Bilirubin: 0.6 mg/dL (ref 0.2–1.2)

## 2017-11-13 LAB — URINALYSIS, ROUTINE W REFLEX MICROSCOPIC
BACTERIA UA: NONE SEEN /HPF
Bilirubin Urine: NEGATIVE
Glucose, UA: NEGATIVE
HGB URINE DIPSTICK: NEGATIVE
HYALINE CAST: NONE SEEN /LPF
Ketones, ur: NEGATIVE
Nitrite: NEGATIVE
PROTEIN: NEGATIVE
RBC / HPF: NONE SEEN /HPF (ref 0–2)
Specific Gravity, Urine: 1.011 (ref 1.001–1.03)
pH: 6.5 (ref 5.0–8.0)

## 2017-11-13 LAB — HEMOGLOBIN A1C
HEMOGLOBIN A1C: 6 %{Hb} — AB (ref <5.7)
Mean Plasma Glucose: 126 (calc)
eAG (mmol/L): 7 (calc)

## 2017-11-13 NOTE — Progress Notes (Signed)
Pt was seen in the clinic yesterday, and she was complaining of urinary urgency.  UA revealed trace leukocytes, which may represent a UTI. The specimen may be contaminated due to squamous cells being present.  Please advise pt to follow up with PCP for urine culture.   She is on long-term prednisone for treatment of PMR.  She was concerned about developing diabetes and she requested hgb A1 c to be ordered.  Please forward results to PCP.  Non-fasting glucose was 87 yesterday.

## 2017-12-12 ENCOUNTER — Other Ambulatory Visit: Payer: Self-pay | Admitting: Rheumatology

## 2017-12-12 NOTE — Telephone Encounter (Signed)
Last visit: 11/12/17 Next Visit: due January 2020. Message sent to front to schedule patient  Okay to refill per Dr. Estanislado Pandy

## 2018-01-13 ENCOUNTER — Other Ambulatory Visit: Payer: Self-pay | Admitting: Rheumatology

## 2018-01-13 NOTE — Telephone Encounter (Signed)
Last visit: 11/12/17 Next Visit: 04/10/18  Patient is currently on Prednisone 5 mg for the next month. Then will taper to 4 mg.    Okay to refill per Dr. Estanislado Pandy

## 2018-01-18 ENCOUNTER — Other Ambulatory Visit: Payer: Self-pay | Admitting: Rheumatology

## 2018-01-19 ENCOUNTER — Telehealth: Payer: Self-pay | Admitting: Pharmacist

## 2018-01-19 NOTE — Telephone Encounter (Signed)
Last visit: 11/12/17 Next Visit: 04/10/18 Labs: 11/12/17 Sodium 131 Chloride 92    Left message to advise patient she is due for labs.   Okay to refill per Dr. Estanislado Pandy

## 2018-01-19 NOTE — Telephone Encounter (Signed)
Benefits Investigation  Patient eligible to fill Prolia at [facility] for $128.

## 2018-01-28 ENCOUNTER — Telehealth: Payer: Self-pay | Admitting: Rheumatology

## 2018-01-28 NOTE — Telephone Encounter (Signed)
Delivery has been scheduled for 01/29/18.

## 2018-01-28 NOTE — Telephone Encounter (Signed)
[facility] calling to schedule delivery of Prolia for patient. Please call to advise. Call [phone] option 4. Order# [id number].

## 2018-01-29 ENCOUNTER — Other Ambulatory Visit: Payer: Self-pay

## 2018-01-29 DIAGNOSIS — Z5181 Encounter for therapeutic drug level monitoring: Secondary | ICD-10-CM

## 2018-01-29 NOTE — Telephone Encounter (Signed)
Prolia has been delivered and placed in fridge.  2:27 PM Beatriz Chancellor, CPhT

## 2018-01-30 LAB — COMPLETE METABOLIC PANEL WITH GFR
AG Ratio: 1.7 (calc) (ref 1.0–2.5)
ALBUMIN MSPROF: 4 g/dL (ref 3.6–5.1)
ALT: 17 U/L (ref 6–29)
AST: 21 U/L (ref 10–35)
Alkaline phosphatase (APISO): 34 U/L (ref 33–130)
BILIRUBIN TOTAL: 0.7 mg/dL (ref 0.2–1.2)
BUN: 14 mg/dL (ref 7–25)
CO2: 30 mmol/L (ref 20–32)
Calcium: 9.1 mg/dL (ref 8.6–10.4)
Chloride: 98 mmol/L (ref 98–110)
Creat: 0.63 mg/dL (ref 0.60–0.93)
GFR, EST AFRICAN AMERICAN: 99 mL/min/{1.73_m2} (ref >=60)
GFR, Est Non African American: 85 mL/min/{1.73_m2} (ref >=60)
GLUCOSE: 88 mg/dL (ref 65–99)
Globulin: 2.4 g/dL (calc) (ref 1.9–3.7)
Potassium: 4.4 mmol/L (ref 3.5–5.3)
Sodium: 135 mmol/L (ref 135–146)
TOTAL PROTEIN: 6.4 g/dL (ref 6.1–8.1)

## 2018-02-02 NOTE — Progress Notes (Signed)
CMP WNL

## 2018-02-04 ENCOUNTER — Other Ambulatory Visit: Payer: Self-pay | Admitting: Rheumatology

## 2018-02-04 ENCOUNTER — Telehealth: Payer: Self-pay | Admitting: *Deleted

## 2018-02-04 NOTE — Telephone Encounter (Signed)
Attempted to contact the patient and left message for patient to call the office. Patient is due for her Prolia injection. Per Dr. Estanislado Pandy okay to given injection 9 days after labs as day 10 falls on a Saturday.

## 2018-02-04 NOTE — Telephone Encounter (Signed)
Last visit: 11/12/17 Next Visit:04/10/18  Okay to refill per Dr. Estanislado Pandy

## 2018-02-05 NOTE — Telephone Encounter (Signed)
Patient called and left voicemail in my box for return call. [phone].

## 2018-02-05 NOTE — Telephone Encounter (Signed)
Patient has been scheduled for 02/06/18 at 1 pm.

## 2018-02-06 ENCOUNTER — Ambulatory Visit (INDEPENDENT_AMBULATORY_CARE_PROVIDER_SITE_OTHER): Payer: Medicare Other | Admitting: *Deleted

## 2018-02-06 VITALS — BP 140/72 | HR 62

## 2018-02-06 DIAGNOSIS — M810 Age-related osteoporosis without current pathological fracture: Secondary | ICD-10-CM

## 2018-02-06 MED ORDER — DENOSUMAB 60 MG/ML ~~LOC~~ SOSY
60.0000 mg | PREFILLED_SYRINGE | Freq: Once | SUBCUTANEOUS | Status: AC
  Administered 2018-02-06: 60 mg via SUBCUTANEOUS

## 2018-02-06 NOTE — Progress Notes (Signed)
Patient in office in for a Prolia injection. Patient was given injection in right upper arm. Patient tolerated injection well. Patient will return in 10 days for labs.   Administrations This Visit    denosumab (PROLIA) injection 60 mg    Admin Date 02/06/2018 Action Given Dose 60 mg Route Subcutaneous Administered By Carole Binning, LPN

## 2018-02-17 ENCOUNTER — Other Ambulatory Visit: Payer: Self-pay

## 2018-02-17 DIAGNOSIS — Z5181 Encounter for therapeutic drug level monitoring: Secondary | ICD-10-CM

## 2018-02-18 LAB — COMPLETE METABOLIC PANEL WITH GFR
AG Ratio: 1.9 (calc) (ref 1.0–2.5)
ALKALINE PHOSPHATASE (APISO): 34 U/L (ref 33–130)
ALT: 16 U/L (ref 6–29)
AST: 18 U/L (ref 10–35)
Albumin: 4.4 g/dL (ref 3.6–5.1)
BILIRUBIN TOTAL: 0.5 mg/dL (ref 0.2–1.2)
BUN: 17 mg/dL (ref 7–25)
CHLORIDE: 97 mmol/L — AB (ref 98–110)
CO2: 32 mmol/L (ref 20–32)
CREATININE: 0.64 mg/dL (ref 0.60–0.93)
Calcium: 9.6 mg/dL (ref 8.6–10.4)
GFR, Est African American: 98 mL/min/{1.73_m2} (ref >=60)
GFR, Est Non African American: 85 mL/min/{1.73_m2} (ref >=60)
Globulin: 2.3 g/dL (calc) (ref 1.9–3.7)
Glucose, Bld: 101 mg/dL — ABNORMAL HIGH (ref 65–99)
Potassium: 5.5 mmol/L — ABNORMAL HIGH (ref 3.5–5.3)
Sodium: 132 mmol/L — ABNORMAL LOW (ref 135–146)
Total Protein: 6.7 g/dL (ref 6.1–8.1)

## 2018-02-18 NOTE — Progress Notes (Signed)
Glucose is 101. Potassium is elevated-5.5.  Please forward labs to PCP.

## 2018-03-09 ENCOUNTER — Other Ambulatory Visit: Payer: Self-pay | Admitting: Rheumatology

## 2018-03-09 NOTE — Telephone Encounter (Addendum)
Last visit: 11/12/17 Next Visit:04/10/18 Labs: 02/17/18 Glucose is 101. Potassium is elevated-5.5  Patient advised we need updated PLQ eye exam.   Okay to refill 30 day supply per Dr. Estanislado Pandy

## 2018-03-10 ENCOUNTER — Other Ambulatory Visit: Payer: Self-pay | Admitting: Rheumatology

## 2018-03-27 NOTE — Progress Notes (Signed)
Office Visit Note  Patient: Ellen Thomas             Date of Birth: 04/06/1935           MRN: [id number]             PCP: Bernerd Limbo, MD Referring: Bernerd Limbo, MD Visit Date: 04/10/2018 Occupation: @GUAROCC @  Subjective:  Right hand pain    History of Present Illness: Ellen Thomas is a 82 y.o. female with history of PMR and osteoarthritis.  She is taking Prednisone 3 mg po daily.  She will be reducing her dose of prednisone to 2 mg on Monday.  She is tapering by 1 mg every month. She has been taking PLQ 200 mg 1 tablet by mouth every other day.  She cannot tolerate taking it daily due to GI SE.  She reports that recently she feels like the PLQ has been causing dizziness and drowsiness.  She has started taking her metoprolol at bedtime and PLQ in the morning but continues to experience these symptoms.  She discontinued PLQ 2 weeks ago.  She has noticed increased and pain and swelling in the right hand for the past 2 weeks. She continues to have chronic pain in both feet, worse in the left foot.  She denies any signs or symptoms of a PMR flare.  She denies any increased muscle weakness or muscle tenderness.  she has been going to water exercise class 2-3 days per week and walking several times a week 40-60 minutes.     Activities of Daily Living:  Patient reports morning stiffness for 0 minutes.   Patient Reports nocturnal pain.  Difficulty dressing/grooming: Denies Difficulty climbing stairs: Denies Difficulty getting out of chair: Denies Difficulty using hands for taps, buttons, cutlery, and/or writing: Reports  Review of Systems  Constitutional: Negative for fatigue.  HENT: Positive for mouth dryness. Negative for mouth sores, trouble swallowing, trouble swallowing and nose dryness.   Eyes: Negative for pain, redness, itching, visual disturbance and dryness.  Respiratory: Negative for cough, hemoptysis, shortness of breath, wheezing and difficulty breathing.     Cardiovascular: Negative for chest pain, palpitations, hypertension and swelling in legs/feet.  Gastrointestinal: Negative for abdominal pain, blood in stool, constipation and diarrhea.  Endocrine: Negative for increased urination.  Genitourinary: Negative for painful urination, nocturia and pelvic pain.  Musculoskeletal: Positive for arthralgias, joint pain and joint swelling. Negative for myalgias, muscle weakness, morning stiffness, muscle tenderness and myalgias.  Skin: Positive for hair loss. Negative for color change, pallor, rash, nodules/bumps, skin tightness, ulcers and sensitivity to sunlight.  Allergic/Immunologic: Negative for susceptible to infections.  Neurological: Negative for dizziness, light-headedness, numbness, headaches, memory loss and weakness.  Hematological: Negative for bruising/bleeding tendency and swollen glands.  Psychiatric/Behavioral: Negative for depressed mood, confusion and sleep disturbance. The patient is not nervous/anxious.     PMFS History:  Patient Active Problem List   Diagnosis Date Noted  . History of humerus fracture 04/23/2016  . History of gastroesophageal reflux (GERD) 04/23/2016  . Positive ANA (antinuclear antibody) 04/23/2016  . History of poliomyelitis 04/23/2016  . Vitiligo 04/23/2016  . Scoliosis 04/23/2016  . Foreign body granuloma of skin and subcutaneous tissue 11/16/2015  . At risk for falling 03/24/2015  . Bronchitis 03/10/2014  . Encounter for general adult medical examination without abnormal findings 02/13/2014  . BMI (body mass index), pediatric, 5% to less than 85% for age 47/07/2013  . Hypercholesterolemia 04/05/2013  . Benign hypertension 04/05/2013  . Polymyalgia  rheumatica ([place]) 04/05/2013  . Other specified health status 04/05/2013  . Disease of skin and subcutaneous tissue 03/11/2013  . OP (osteoporosis) 03/11/2013  . 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 Esophagitis, reflux 03/11/2013  .  Neuralgia neuritis, sciatic nerve 03/11/2013  . Encounter for screening for eye and ear disorders 03/11/2013  . MGUS (monoclonal gammopathy of unknown significance) 11/01/2011    Past Medical History:  Diagnosis Date  . High cholesterol   . Hypertension   . Kyphosis   . PMR (polymyalgia rheumatica) (HCC)   . Polio   . Scoliosis   . Vitiligo     Family History  Problem Relation Age of Onset  . Diabetes Mother   . Cancer Brother    Past Surgical History:  Procedure Laterality Date  . ABDOMINAL HYSTERECTOMY  1993  . SHOULDER FUSION SURGERY Left    took a piece of hip and fused it into left shoulder  . TOTAL SHOULDER ARTHROPLASTY     Social History   Social History Narrative  . Not on file    Objective: Vital Signs: BP 131/61 (BP Location: Right Arm, Patient Position: Sitting, Cuff Size: Normal)   Pulse 63   Resp 13   Ht 5\' 1"  (1.549 m)   Wt 105 lb (47.6 kg)   BMI 19.84 kg/m    Physical Exam Vitals signs and nursing note reviewed.  Constitutional:      Appearance: She is well-developed.  HENT:     Head: Normocephalic and atraumatic.  Eyes:     Conjunctiva/sclera: Conjunctivae normal.  Neck:     Musculoskeletal: Normal range of motion.  Cardiovascular:     Rate and Rhythm: Normal rate and regular rhythm.     Heart sounds: Normal heart sounds.  Pulmonary:     Effort: Pulmonary effort is normal.     Breath sounds: Normal breath sounds.  Abdominal:     General: Bowel sounds are normal.     Palpations: Abdomen is soft.  Lymphadenopathy:     Cervical: No cervical adenopathy.  Skin:    General: Skin is warm and dry.     Capillary Refill: Capillary refill takes less than 2 seconds.  Neurological:     Mental Status: She is alert and oriented to person, place, and time.  Psychiatric:        Behavior: Behavior normal.      Musculoskeletal Exam: C-spine, thoracic spine, and lumbar spine limited ROM.  Limited ROM of both shoulder joints.  Right 2nd and 4th PIP  joint synovitis and tenderness.  PIP and DIP synovial thickening consistent with osteoarthritis.  Bilateral CMC joint synovial thickening.  Knee joints good ROM with no warmth or effusion. Ankle joints good ROM with no warmth or effusion. No tenderness of MTPs.  She has tenderness of the left 3rd and 4th toes.   CDAI Exam: CDAI Score: Not documented Patient Global Assessment: Not documented; Provider Global Assessment: Not documented Swollen: 3 ; Tender: 3  Joint Exam      Right  Left  PIP 2  Swollen Tender     PIP 4  Swollen Tender     DIP 2  Swollen Tender      There is currently no information documented on the homunculus. Go to the Rheumatology activity and complete the homunculus joint exam.  Investigation: No additional findings.  Imaging: No results found.  Recent Labs: Lab Results  Component Value Date   WBC 6.3 08/07/2017   HGB 14.6 08/07/2017  PLT 171 08/07/2017   NA 132 (L) 02/17/2018   K 5.5 (H) 02/17/2018   CL 97 (L) 02/17/2018   CO2 32 02/17/2018   GLUCOSE 101 (H) 02/17/2018   BUN 17 02/17/2018   CREATININE 0.64 02/17/2018   BILITOT 0.5 02/17/2018   ALKPHOS 51 07/02/2016   AST 18 02/17/2018   ALT 16 02/17/2018   PROT 6.7 02/17/2018   ALBUMIN 3.9 07/02/2016   CALCIUM 9.6 02/17/2018   GFRAA 98 02/17/2018    Speciality Comments: PLQ eye exam normal on 06/02/17 per [facility]  Procedures:  No procedures performed Allergies: Other and Tetracyclines & related        Assessment / Plan:     Visit Diagnoses: Polymyalgia rheumatica ([place]): She has not had any signs or symptoms of a PMR flare.  She has no muscle weakness or muscle tenderness.  She has no difficulty getting up from a chair or raising her arms above her head.  She has no temporal artery tenderness. She is currently taking Prednisone 3 mg po daily and she will be reducing the dose to 2 mg po daily in 1 week.  She will continue tapering by 1 mg every month.  A refill of prednisone will  be sent to the pharmacy.    High risk medication use - PLQ 200 mg 1 tablet po every other day, prednisone 3 mg p.o. tapering by 1 mg every month. Standing order for CBC placed today. She will be due for lab work in April and every 5 months.  Last Plaquenil eye exam normal on 06/02/2017.  Patient received flu vaccine in November and previously had Pneumovax 23 and Prevnar 13.  - Plan: CBC with Differential/Platelet  Primary osteoarthritis of both hands - RF-, CCP-: She has PIP and DIP synovial thickening consistent with osteoarthritis of both hands.  She has tenderness and inflammation of the right 2nd and 4th PIP joints.  She will continue on prednisone 3 mg po daily for 1 week and then taper by 1 mg every month.  A refill of prednisone was sent to the pharmacy.   Primary osteoarthritis of both feet: She has PIP and DIP synovial thickening consistent with osteoarthritis of both feet. We discussed the importance of wearing proper fitting shoes.  Age-related osteoporosis without current pathological fracture -  DEXA scan was on 04/23/2017 revealed a T score of -2.4. On Prolia every 6 months.  Last injection 02/06/2018.  Positive ANA (antinuclear antibody) - Ro+  Other medical conditions are listed as follows:   Scoliosis  Other fatigue  Vitiligo  History of humerus fracture  History of poliomyelitis  History of hypertension  History of gastroesophageal reflux (GERD)  Dyslipidemia  MGUS (monoclonal gammopathy of unknown significance)   Orders: Orders Placed This Encounter  Procedures  . CBC with Differential/Platelet   Meds ordered this encounter  Medications  . predniSONE (DELTASONE) 1 MG tablet    Sig: Take 3 tablets (3 mg total) by mouth daily with breakfast.    Dispense:  90 tablet    Refill:  0    Face-to-face time spent with patient was 30 minutes. Greater than 50% of time was spent in counseling and coordination of care.  Follow-Up Instructions: Return in about 5 months  (around 09/09/2018) for Polymyalgia Rheumatica, Osteoarthritis.   Ofilia Neas, PA-C  Note - This record has been created using Dragon software.  Chart creation errors have been sought, but may not always  have been located. Such creation errors  do not reflect on  the standard of medical care.

## 2018-04-10 ENCOUNTER — Encounter: Payer: Self-pay | Admitting: Physician Assistant

## 2018-04-10 ENCOUNTER — Ambulatory Visit: Payer: Medicare Other | Admitting: Physician Assistant

## 2018-04-10 VITALS — BP 131/61 | HR 63 | Resp 13 | Ht 61.0 in | Wt 105.0 lb

## 2018-04-10 DIAGNOSIS — M19041 Primary osteoarthritis, right hand: Secondary | ICD-10-CM

## 2018-04-10 DIAGNOSIS — Z8679 Personal history of other diseases of the circulatory system: Secondary | ICD-10-CM

## 2018-04-10 DIAGNOSIS — M353 Polymyalgia rheumatica: Secondary | ICD-10-CM | POA: Diagnosis not present

## 2018-04-10 DIAGNOSIS — M19072 Primary osteoarthritis, left ankle and foot: Secondary | ICD-10-CM

## 2018-04-10 DIAGNOSIS — M19042 Primary osteoarthritis, left hand: Secondary | ICD-10-CM

## 2018-04-10 DIAGNOSIS — R5383 Other fatigue: Secondary | ICD-10-CM

## 2018-04-10 DIAGNOSIS — M41125 Adolescent idiopathic scoliosis, thoracolumbar region: Secondary | ICD-10-CM

## 2018-04-10 DIAGNOSIS — D472 Monoclonal gammopathy: Secondary | ICD-10-CM

## 2018-04-10 DIAGNOSIS — M19071 Primary osteoarthritis, right ankle and foot: Secondary | ICD-10-CM

## 2018-04-10 DIAGNOSIS — Z79899 Other long term (current) drug therapy: Secondary | ICD-10-CM | POA: Diagnosis not present

## 2018-04-10 DIAGNOSIS — E785 Hyperlipidemia, unspecified: Secondary | ICD-10-CM

## 2018-04-10 DIAGNOSIS — R768 Other specified abnormal immunological findings in serum: Secondary | ICD-10-CM

## 2018-04-10 DIAGNOSIS — Z8719 Personal history of other diseases of the digestive system: Secondary | ICD-10-CM

## 2018-04-10 DIAGNOSIS — L80 Vitiligo: Secondary | ICD-10-CM

## 2018-04-10 DIAGNOSIS — Z8612 Personal history of poliomyelitis: Secondary | ICD-10-CM

## 2018-04-10 DIAGNOSIS — Z8781 Personal history of (healed) traumatic fracture: Secondary | ICD-10-CM

## 2018-04-10 DIAGNOSIS — M810 Age-related osteoporosis without current pathological fracture: Secondary | ICD-10-CM

## 2018-04-10 MED ORDER — PREDNISONE 1 MG PO TABS: 3.0000 mg | ORAL_TABLET | Freq: Every day | ORAL | 0 refills | Status: DC

## 2018-04-10 NOTE — Patient Instructions (Signed)
Standing Labs We placed an order today for your standing lab work.    Please come back and get your standing labs in April and every 5 months  We have open lab Monday through Friday from 8:30-11:30 AM and 1:30-4:00 PM  at the office of Dr. Bo Merino.   You may experience shorter wait times on Monday and Friday afternoons. The office is located at [address] [place], [place], [place] [address] No appointment is necessary.   Labs are drawn by [facility].  You may receive a bill from [place] [facility] for your lab work.  If you wish to have your labs drawn at another location, please call the office 24 hours in advance to send orders.  If you have any questions regarding directions or hours of operation,  please call [phone].   Just as a reminder please drink plenty of water prior to coming for your lab work. Thanks!  Methotrexate tablets What is this medicine? METHOTREXATE (METH oh TREX ate) is a chemotherapy drug used to treat cancer including breast cancer, leukemia, and lymphoma. This medicine can also be used to treat psoriasis and certain kinds of arthritis. This medicine may be used for other purposes; ask your health care provider or pharmacist if you have questions. COMMON BRAND NAME(S): Rheumatrex, Trexall What should I tell my health care provider before I take this medicine? They need to know if you have any of these conditions: -fluid in the stomach area or lungs -if you often drink alcohol -infection or immune system problems -kidney disease or on hemodialysis -liver disease -low blood counts, like low white cell, platelet, or red cell counts -lung disease -radiation therapy -stomach ulcers -ulcerative colitis -an unusual or allergic reaction to methotrexate, other medicines, foods, dyes, or preservatives -pregnant or trying to get pregnant -breast-feeding How should I use this medicine? Take this medicine by mouth with a glass of water. Follow the directions on  the prescription label. Take your medicine at regular intervals. Do not take it more often than directed. Do not stop taking except on your doctor's advice. Make sure you know why you are taking this medicine and how often you should take it. If this medicine is used for a condition that is not cancer, like arthritis or psoriasis, it should be taken weekly, NOT daily. Taking this medicine more often than directed can cause serious side effects, even death. Talk to your healthcare provider about safe handling and disposal of this medicine. You may need to take special precautions. Talk to your pediatrician regarding the use of this medicine in children. While this drug may be prescribed for selected conditions, precautions do apply. Overdosage: If you think you have taken too much of this medicine contact a poison control center or emergency room at once. NOTE: This medicine is only for you. Do not share this medicine with others. What if I miss a dose? If you miss a dose, talk with your doctor or health care professional. Do not take double or extra doses. What may interact with this medicine? This medicine may interact with the following medication: -acitretin -aspirin and aspirin-like medicines including salicylates -azathioprine -certain antibiotics like penicillins, tetracycline, and chloramphenicol -cyclosporine -gold -hydroxychloroquine -live virus vaccines -NSAIDs, medicines for pain and inflammation, like ibuprofen or naproxen -other cytotoxic agents -penicillamine -phenylbutazone -phenytoin -probenecid -retinoids such as isotretinoin and tretinoin -steroid medicines like prednisone or cortisone -sulfonamides like sulfasalazine and trimethoprim/sulfamethoxazole -theophylline This list may not describe all possible interactions. Give your health care provider  a list of all the medicines, herbs, non-prescription drugs, or dietary supplements you use. Also tell them if you smoke,  drink alcohol, or use illegal drugs. Some items may interact with your medicine. What should I watch for while using this medicine? Avoid alcoholic drinks. This medicine can make you more sensitive to the sun. Keep out of the sun. If you cannot avoid being in the sun, wear protective clothing and use sunscreen. Do not use sun lamps or tanning beds/booths. You may need blood work done while you are taking this medicine. Call your doctor or health care professional for advice if you get a fever, chills or sore throat, or other symptoms of a cold or flu. Do not treat yourself. This drug decreases your body's ability to fight infections. Try to avoid being around people who are sick. This medicine may increase your risk to bruise or bleed. Call your doctor or health care professional if you notice any unusual bleeding. Check with your doctor or health care professional if you get an attack of severe diarrhea, nausea and vomiting, or if you sweat a lot. The loss of too much body fluid can make it dangerous for you to take this medicine. Talk to your doctor about your risk of cancer. You may be more at risk for certain types of cancers if you take this medicine. Both men and women must use effective birth control with this medicine. Do not become pregnant while taking this medicine or until at least 1 normal menstrual cycle has occurred after stopping it. Women should inform their doctor if they wish to become pregnant or think they might be pregnant. Men should not father a child while taking this medicine and for 3 months after stopping it. There is a potential for serious side effects to an unborn child. Talk to your health care professional or pharmacist for more information. Do not breast-feed an infant while taking this medicine. What side effects may I notice from receiving this medicine? Side effects that you should report to your doctor or health care professional as soon as possible: -allergic  reactions like skin rash, itching or hives, swelling of the face, lips, or tongue -breathing problems or shortness of breath -diarrhea -dry, nonproductive cough -low blood counts - this medicine may decrease the number of white blood cells, red blood cells and platelets. You may be at increased risk for infections and bleeding. -mouth sores -redness, blistering, peeling or loosening of the skin, including inside the mouth -signs of infection - fever or chills, cough, sore throat, pain or trouble passing urine -signs and symptoms of bleeding such as bloody or black, tarry stools; red or dark-brown urine; spitting up blood or brown material that looks like coffee grounds; red spots on the skin; unusual bruising or bleeding from the eye, gums, or nose -signs and symptoms of kidney injury like trouble passing urine or change in the amount of urine -signs and symptoms of liver injury like dark yellow or brown urine; general ill feeling or flu-like symptoms; light-colored stools; loss of appetite; nausea; right upper belly pain; unusually weak or tired; yellowing of the eyes or skin Side effects that usually do not require medical attention (report to your doctor or health care professional if they continue or are bothersome): -dizziness -hair loss -tiredness -upset stomach -vomiting This list may not describe all possible side effects. Call your doctor for medical advice about side effects. You may report side effects to FDA at 1-800-FDA-1088. Where should I  keep my medicine? Keep out of the reach of children. Store at room temperature between 20 and 25 degrees C (68 and 77 degrees F). Protect from light. Throw away any unused medicine after the expiration date. NOTE: This sheet is a summary. It may not cover all possible information. If you have questions about this medicine, talk to your doctor, pharmacist, or health care provider.  2019 Elsevier/Gold Standard (2016-11-07 13:38:43)

## 2018-04-15 ENCOUNTER — Other Ambulatory Visit: Payer: Self-pay | Admitting: Rheumatology

## 2018-04-15 NOTE — Telephone Encounter (Signed)
Last Visit: 04/10/18 Next Visit: 09/09/18  Labs: 02/17/18 Glucose is 101. Potassium is elevated-5.5  PLQ eye exam normal on 06/02/17   Okay to refill per Dr. Estanislado Pandy

## 2018-05-20 ENCOUNTER — Other Ambulatory Visit: Payer: Self-pay | Admitting: Rheumatology

## 2018-05-20 NOTE — Telephone Encounter (Signed)
Last Visit: 04/10/2018 Next Visit: 09/09/2018  Patient is tapering by 1mg  each month.  Okay to refill per Dr. Estanislado Pandy.

## 2018-05-26 ENCOUNTER — Telehealth: Payer: Self-pay | Admitting: Rheumatology

## 2018-05-26 NOTE — Telephone Encounter (Signed)
Patient called checking if she should continue taking Prednisone 2 mg or if she should go back to taking 1 mg.  Patient is also checking when she completes her prescription of Prednisone in a few weeks should she also discontinue the Plaquenil.  Patient requested a return call.

## 2018-05-27 ENCOUNTER — Other Ambulatory Visit: Payer: Self-pay | Admitting: Rheumatology

## 2018-05-27 NOTE — Telephone Encounter (Signed)
Attempted to contact the patient and left message for patient to call the office.  

## 2018-05-29 NOTE — Telephone Encounter (Signed)
She should continue the prednisone taper as discussed.  She should be on prednisone 1 mg p.o. daily for 1 month.  She will continue Plaquenil even after discontinuing prednisone.

## 2018-05-29 NOTE — Telephone Encounter (Signed)
Left message to advise patient she should continue the prednisone taper as discussed.  She should be on prednisone 1 mg p.o. daily for 1 month.  She will continue Plaquenil even after discontinuing prednisone.

## 2018-05-29 NOTE — Telephone Encounter (Signed)
Patient states she is tapering Prednisone by 1 mg every month. Patient states she started taking 1 mg in the middle of February. Patient she picked up her prescription from the pharmacy it was sent in for 2 mg. Patient would like to know if you want her to continue taking with 1 mg or 2 mg. Patient wants to know if when she finishes the Prednisone in March 2020 would she need to discontinue the PLQ? Please advise.

## 2018-06-19 ENCOUNTER — Telehealth: Payer: Self-pay | Admitting: Pharmacy Technician

## 2018-06-19 DIAGNOSIS — M353 Polymyalgia rheumatica: Secondary | ICD-10-CM

## 2018-06-19 MED ORDER — HYDROXYCHLOROQUINE SULFATE 200 MG PO TABS: ORAL_TABLET | ORAL | 0 refills | Status: DC

## 2018-06-19 NOTE — Telephone Encounter (Signed)
Patient is on Plaquenil. Please send in prescription.  Called patient, left message.

## 2018-07-16 ENCOUNTER — Telehealth: Payer: Self-pay | Admitting: Rheumatology

## 2018-07-16 NOTE — Telephone Encounter (Signed)
Attempted to contact the patient and left message for patient to call the office. Patient's next Prolia due on or after Aug 07, 2018.

## 2018-07-16 NOTE — Telephone Encounter (Signed)
Patient advised she is due for her next Prolia Aug 07, 2018. Patient advised she is to have labs 10 days before and 10 days after injection. Patient advised she will need [facility] [address]. Patient verbalized understanding.

## 2018-07-16 NOTE — Telephone Encounter (Signed)
Patient left a message 07/16/18, requesting a return call. Patient wants to know when she is due for her Prolia shot? Please call to advise.

## 2018-07-27 ENCOUNTER — Telehealth: Payer: Self-pay | Admitting: Rheumatology

## 2018-07-27 NOTE — Telephone Encounter (Signed)
Patient called requesting a return call to pick up her lab orders to bring to [place].  Patient states she prefers to have the lab orders so she knows what they will be testing for.  Patient also requested a return call from Seth Bake to schedule an appointment for her Prolia injection.  Patient was told that Seth Bake will be in the office on Wednesday, 4/29 in the afternoon.

## 2018-07-28 ENCOUNTER — Other Ambulatory Visit: Payer: Self-pay

## 2018-07-28 DIAGNOSIS — Z5181 Encounter for therapeutic drug level monitoring: Secondary | ICD-10-CM

## 2018-07-28 DIAGNOSIS — Z79899 Other long term (current) drug therapy: Secondary | ICD-10-CM

## 2018-07-28 NOTE — Telephone Encounter (Signed)
Lab orders have been released for [facility] to go to [facility]. Patient verbalized understanding. Advised patient that Seth Bake would call her tomorrow to discuss Prolia scheduling. Patient states she will call the pharmacy and have them go ahead and ship medication to the office.

## 2018-07-29 MED ORDER — DENOSUMAB 60 MG/ML ~~LOC~~ SOSY: PREFILLED_SYRINGE | SUBCUTANEOUS | 0 refills | Status: DC

## 2018-07-29 NOTE — Telephone Encounter (Signed)
Last Visit: 04/10/2018 Next Visit: 09/09/2018 Labs: 02/17/18 Glucose is 101. Potassium is elevated-5.5.  Patient is going for labs this week for Prolia. Will schedule appointment once Prolia is received in office.   Okay to refill Prolia per Dr. Estanislado Pandy

## 2018-07-30 ENCOUNTER — Telehealth: Payer: Self-pay | Admitting: *Deleted

## 2018-07-30 NOTE — Telephone Encounter (Signed)
Prolia injection scheduled for delivery on 08/05/18. Confirmation number [id number].

## 2018-08-07 LAB — COMPLETE METABOLIC PANEL WITH GFR
AG Ratio: 1.9 (calc) (ref 1.0–2.5)
ALT: 11 U/L (ref 6–29)
AST: 17 U/L (ref 10–35)
Albumin: 4.3 g/dL (ref 3.6–5.1)
Alkaline phosphatase (APISO): 56 U/L (ref 37–153)
BUN/Creatinine Ratio: 21 (calc) (ref 6–22)
BUN: 12 mg/dL (ref 7–25)
CO2: 34 mmol/L — ABNORMAL HIGH (ref 20–32)
Calcium: 9.4 mg/dL (ref 8.6–10.4)
Chloride: 96 mmol/L — ABNORMAL LOW (ref 98–110)
Creat: 0.58 mg/dL — ABNORMAL LOW (ref 0.60–0.88)
GFR, Est African American: 101 mL/min/{1.73_m2} (ref >=60)
GFR, Est Non African American: 87 mL/min/{1.73_m2} (ref >=60)
Globulin: 2.3 g/dL (calc) (ref 1.9–3.7)
Glucose, Bld: 76 mg/dL (ref 65–99)
Potassium: 3.8 mmol/L (ref 3.5–5.3)
Sodium: 135 mmol/L (ref 135–146)
Total Bilirubin: 0.6 mg/dL (ref 0.2–1.2)
Total Protein: 6.6 g/dL (ref 6.1–8.1)

## 2018-08-07 LAB — CBC WITH DIFFERENTIAL/PLATELET
Absolute Monocytes: 344 cells/uL (ref 200–950)
Basophils Absolute: 20 cells/uL (ref 0–200)
Basophils Relative: 0.5 %
Eosinophils Absolute: 80 cells/uL (ref 15–500)
Eosinophils Relative: 2 %
HCT: 41.1 % (ref 35.0–45.0)
Hemoglobin: 13.9 g/dL (ref 11.7–15.5)
Lymphs Abs: 1120 cells/uL (ref 850–3900)
MCH: 30.3 pg (ref 27.0–33.0)
MCHC: 33.8 g/dL (ref 32.0–36.0)
MCV: 89.7 fL (ref 80.0–100.0)
MPV: 9.2 fL (ref 7.5–12.5)
Monocytes Relative: 8.6 %
Neutro Abs: 2436 cells/uL (ref 1500–7800)
Neutrophils Relative %: 60.9 %
Platelets: 205 10*3/uL (ref 140–400)
RBC: 4.58 10*6/uL (ref 3.80–5.10)
RDW: 11.8 % (ref 11.0–15.0)
Total Lymphocyte: 28 %
WBC: 4 10*3/uL (ref 3.8–10.8)

## 2018-08-10 NOTE — Progress Notes (Signed)
CBC WNL.  Creatinine borderline low.  Rest of CMP stable.

## 2018-08-17 ENCOUNTER — Ambulatory Visit (INDEPENDENT_AMBULATORY_CARE_PROVIDER_SITE_OTHER): Payer: Medicare Other | Admitting: *Deleted

## 2018-08-17 ENCOUNTER — Other Ambulatory Visit: Payer: Self-pay

## 2018-08-17 VITALS — BP 152/49 | HR 72

## 2018-08-17 DIAGNOSIS — M810 Age-related osteoporosis without current pathological fracture: Secondary | ICD-10-CM

## 2018-08-17 MED ORDER — DENOSUMAB 60 MG/ML ~~LOC~~ SOSY
60.0000 mg | PREFILLED_SYRINGE | Freq: Once | SUBCUTANEOUS | Status: AC
  Administered 2018-08-17: 60 mg via SUBCUTANEOUS

## 2018-08-17 NOTE — Progress Notes (Signed)
Patient in office for a Prolia injection. Injection given in left arm and patient tolerated injection well. Patient will return in 10 days to check labs.   Administrations This Visit    denosumab (PROLIA) injection 60 mg    Admin Date 08/17/2018 Action Given Dose 60 mg Route Subcutaneous Administered By Carole Binning, LPN

## 2018-08-26 NOTE — Progress Notes (Addendum)
Office Visit Note  Patient: Ellen Thomas             Date of Birth: 08-Dec-1935           MRN: [id number]             PCP: Bernerd Limbo, MD Referring: Bernerd Limbo, MD Visit Date: 09/09/2018 Occupation: @GUAROCC @  Subjective:  Osteoarthritis (Doing good)    History of Present Illness: Ellen Thomas is a 83 y.o. female with history of polymyalgia rheumatica, osteoarthritis and osteoporosis.  She has been off prednisone since middle of March.  She has not noticed any increased muscle weakness or tenderness.  She has been taking Plaquenil 1 tablet every other day.  She continues to have some stiffness in her joints from underlying osteoarthritis.  She states she has intermittent discomfort in her right second MCP joint.  She has been taking Prolia injections every 6 months for osteoporosis.  She is planning to have some dental surgery in the near future.  Activities of Daily Living:  Patient reports morning stiffness for 0 none.   Patient Reports nocturnal pain.  Difficulty dressing/grooming: Denies Difficulty climbing stairs: Denies Difficulty getting out of chair: Denies Difficulty using hands for taps, buttons, cutlery, and/or writing: Reports  Review of Systems  Constitutional: Negative for fatigue, night sweats, weight gain and weight loss.  HENT: Positive for mouth dryness. Negative for mouth sores, trouble swallowing, trouble swallowing and nose dryness.   Eyes: Positive for dryness. Negative for pain, redness and visual disturbance.  Respiratory: Negative for cough, shortness of breath and difficulty breathing.   Cardiovascular: Negative for chest pain, palpitations, hypertension, irregular heartbeat and swelling in legs/feet.  Gastrointestinal: Positive for constipation. Negative for blood in stool and diarrhea.  Endocrine: Negative for cold intolerance and increased urination.  Genitourinary: Negative for difficulty urinating and vaginal dryness.    Musculoskeletal: Negative for arthralgias, joint pain, joint swelling, myalgias, muscle weakness, morning stiffness, muscle tenderness and myalgias.  Skin: Negative for color change, rash, hair loss, skin tightness, ulcers and sensitivity to sunlight.  Allergic/Immunologic: Negative for susceptible to infections.  Neurological: Negative for dizziness, memory loss, night sweats and weakness.  Hematological: Negative for bruising/bleeding tendency and swollen glands.  Psychiatric/Behavioral: Negative for depressed mood and sleep disturbance. The patient is not nervous/anxious.     PMFS History:  Patient Active Problem List   Diagnosis Date Noted   History of humerus fracture 04/23/2016   History of gastroesophageal reflux (GERD) 04/23/2016   Positive ANA (antinuclear antibody) 04/23/2016   History of poliomyelitis 04/23/2016   Vitiligo 04/23/2016   Scoliosis 04/23/2016   Foreign body granuloma of skin and subcutaneous tissue 11/16/2015   At risk for falling 03/24/2015   Bronchitis 03/10/2014   Encounter for general adult medical examination without abnormal findings 02/13/2014   BMI (body mass index), pediatric, 5% to less than 85% for age 14/07/2013   Hypercholesterolemia 04/05/2013   Benign hypertension 04/05/2013   Polymyalgia rheumatica ([place]) 04/05/2013   Other specified health status 04/05/2013   Disease of skin and subcutaneous tissue 03/11/2013   OP (osteoporosis) 03/11/2013   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   Esophagitis, reflux 03/11/2013   Neuralgia neuritis, sciatic nerve 03/11/2013   Encounter for screening for eye and ear disorders 03/11/2013   MGUS (monoclonal gammopathy of unknown significance) 11/01/2011    Past Medical History:  Diagnosis Date   High cholesterol    Hypertension    Kyphosis    PMR (polymyalgia rheumatica) (  [place])    Polio    Scoliosis    Vitiligo     Family History  Problem Relation Age  of Onset   Diabetes Mother    Cancer Brother    Past Surgical History:  Procedure Laterality Date   ABDOMINAL HYSTERECTOMY  49   SHOULDER FUSION SURGERY Left    took a piece of hip and fused it into left shoulder   TOTAL SHOULDER ARTHROPLASTY     Social History   Social History Narrative   Not on file   Immunization History  Administered Date(s) Administered   Influenza,inj,Quad PF,6+ Mos 12/15/2006, 02/13/2009, 02/14/2010, 02/18/2011, 02/10/2015, 02/14/2016, 01/31/2017, 02/03/2018   Pneumococcal Conjugate-13 02/14/2012   Pneumococcal Polysaccharide-23 08/12/2014     Objective: Vital Signs: BP (!) 117/51 (BP Location: Right Arm, Patient Position: Sitting, Cuff Size: Normal)    Pulse 73    Resp 16    Ht 5\' 1"  (1.549 m)    Wt 102 lb 9.6 oz (46.5 kg)    BMI 19.39 kg/m    Physical Exam Vitals signs and nursing note reviewed.  Constitutional:      Appearance: She is well-developed.  HENT:     Head: Normocephalic and atraumatic.  Eyes:     Conjunctiva/sclera: Conjunctivae normal.  Neck:     Musculoskeletal: Normal range of motion.  Cardiovascular:     Rate and Rhythm: Normal rate and regular rhythm.     Heart sounds: Normal heart sounds.  Pulmonary:     Effort: Pulmonary effort is normal.     Breath sounds: Normal breath sounds.  Abdominal:     General: Bowel sounds are normal.     Palpations: Abdomen is soft.  Lymphadenopathy:     Cervical: No cervical adenopathy.  Skin:    General: Skin is warm and dry.     Capillary Refill: Capillary refill takes less than 2 seconds.  Neurological:     Mental Status: She is alert and oriented to person, place, and time.  Psychiatric:        Behavior: Behavior normal.      Musculoskeletal Exam: Very limited range of motion of the cervical spine.  She has limited range of motion of bilateral shoulders.  She has PIP and DIP thickening with some inflammatory component in the right second PIP joint.  Hip joints knee joints  ankles MTPs PIPs with good range of motion.  She has thoracic and lumbar scoliosis.  CDAI Exam: CDAI Score: Not documented Patient Global Assessment: Not documented; Provider Global Assessment: Not documented Swollen: Not documented; Tender: Not documented Joint Exam   Not documented   There is currently no information documented on the homunculus. Go to the Rheumatology activity and complete the homunculus joint exam.  Investigation: No additional findings.  Imaging: No results found.  Recent Labs: Lab Results  Component Value Date   WBC 3.6 (L) 08/27/2018   HGB 13.9 08/27/2018   PLT 198 08/27/2018   NA 135 08/07/2018   K 3.8 08/07/2018   CL 96 (L) 08/07/2018   CO2 34 (H) 08/07/2018   GLUCOSE 76 08/07/2018   BUN 12 08/07/2018   CREATININE 0.58 (L) 08/07/2018   BILITOT 0.6 08/07/2018   ALKPHOS 51 07/02/2016   AST 17 08/07/2018   ALT 11 08/07/2018   PROT 6.6 08/07/2018   ALBUMIN 3.9 07/02/2016   CALCIUM 9.4 08/07/2018   GFRAA 101 08/07/2018    Speciality Comments: PLQ eye exam normal on 06/02/17 per [facility]  Procedures:  No procedures performed Allergies: Other and Tetracyclines & related   Assessment / Plan:     Visit Diagnoses: Polymyalgia rheumatica (HCC)-patient had no increased muscular weakness or tenderness.  She has been off prednisone for almost 3 months now.  She is on Plaquenil 200 mg every other day.  She had several questions regarding the side effects of Plaquenil which were reviewed.  High risk medication use-She is on Plaquenil 200 mg every other day.  Last Plaquenil eye exam normal on 06/02/2017.  Most recent CBC within normal limits except for mildly low WBC on 08/27/2018.  Most recent CMP stable on 08/07/2018.  Will monitor CBC/CMP every 5 months. Patient received flu vaccine in November and previously had Pneumovax 23 and Prevnar 13.   Positive ANA (antinuclear antibody)-she has no clinical features of autoimmune disease.  Age-related  osteoporosis without current pathological fracture- DEXA scan was on 04/23/2017 revealed a T score of -2.4. On Prolia every 6 months and last injection Aug 17, 2018. She is due for her next injection and can now fill at [facility].  Her next bone density will be in January 2021.  She is also concerned about getting some dental work.  I did discuss the increased risk of ONJ although it is a small risk.  She plans to do her the dental procedure prior to her next Prolia injection.  Use of calcium vitamin D and resistive exercise was emphasized.  Primary osteoarthritis of both hands-she has significant PIP and DIP thickening in her hands.  She has been having some intermittent pain in her right second PIP joint.  She is mostly right-hand dominant due to weakness in her left arm.  Which causes increase in strength in her right hand.  Primary osteoarthritis of both feet-doing well with no swelling.  Scoliosis-she does not have much lower back discomfort.  Other fatigue  Vitiligo  History of humerus fracture  History of poliomyelitis  History of hypertension  History of gastroesophageal reflux (GERD)  Dyslipidemia  MGUS (monoclonal gammopathy of unknown significance)   Orders: No orders of the defined types were placed in this encounter.  No orders of the defined types were placed in this encounter.   Face-to-face time spent with patient was 30 minutes. Greater than 50% of time was spent in counseling and coordination of care.  Follow-Up Instructions: Return in about 6 months (around 03/11/2019) for PMR, OA,OP.   Bo Merino, MD  Note - This record has been created using Editor, commissioning.  Chart creation errors have been sought, but may not always  have been located. Such creation errors do not reflect on  the standard of medical care.

## 2018-08-27 ENCOUNTER — Other Ambulatory Visit: Payer: Self-pay | Admitting: *Deleted

## 2018-08-27 DIAGNOSIS — Z79899 Other long term (current) drug therapy: Secondary | ICD-10-CM

## 2018-08-28 LAB — CBC WITH DIFFERENTIAL/PLATELET
Absolute Monocytes: 277 cells/uL (ref 200–950)
Basophils Absolute: 22 cells/uL (ref 0–200)
Basophils Relative: 0.6 %
Eosinophils Absolute: 68 cells/uL (ref 15–500)
Eosinophils Relative: 1.9 %
HCT: 41.6 % (ref 35.0–45.0)
Hemoglobin: 13.9 g/dL (ref 11.7–15.5)
Lymphs Abs: 896 cells/uL (ref 850–3900)
MCH: 30.2 pg (ref 27.0–33.0)
MCHC: 33.4 g/dL (ref 32.0–36.0)
MCV: 90.4 fL (ref 80.0–100.0)
MPV: 9 fL (ref 7.5–12.5)
Monocytes Relative: 7.7 %
Neutro Abs: 2336 cells/uL (ref 1500–7800)
Neutrophils Relative %: 64.9 %
Platelets: 198 10*3/uL (ref 140–400)
RBC: 4.6 10*6/uL (ref 3.80–5.10)
RDW: 11.8 % (ref 11.0–15.0)
Total Lymphocyte: 24.9 %
WBC: 3.6 10*3/uL — ABNORMAL LOW (ref 3.8–10.8)

## 2018-08-28 NOTE — Progress Notes (Signed)
WBC count is mildly low-3.6.  Reviewed with Dr. Estanislado Pandy and we will continue to monitor.  Rest of CBC WNL

## 2018-08-31 ENCOUNTER — Other Ambulatory Visit: Payer: Self-pay | Admitting: Rheumatology

## 2018-08-31 DIAGNOSIS — M353 Polymyalgia rheumatica: Secondary | ICD-10-CM

## 2018-08-31 NOTE — Telephone Encounter (Signed)
Last Visit: 04/10/2018 Next Visit: 09/09/2018 Labs: 08/07/18 CBC WNL. Creatinine borderline low. Rest of CMP stable.  PLQ eye exam normal on 06/02/17   Okay to refill per Dr. Estanislado Pandy

## 2018-09-09 ENCOUNTER — Other Ambulatory Visit: Payer: Self-pay

## 2018-09-09 ENCOUNTER — Encounter: Payer: Self-pay | Admitting: Physician Assistant

## 2018-09-09 ENCOUNTER — Ambulatory Visit: Payer: Medicare Other | Admitting: Rheumatology

## 2018-09-09 VITALS — BP 117/51 | HR 73 | Resp 16 | Ht 61.0 in | Wt 102.6 lb

## 2018-09-09 DIAGNOSIS — Z79899 Other long term (current) drug therapy: Secondary | ICD-10-CM | POA: Diagnosis not present

## 2018-09-09 DIAGNOSIS — R768 Other specified abnormal immunological findings in serum: Secondary | ICD-10-CM

## 2018-09-09 DIAGNOSIS — M810 Age-related osteoporosis without current pathological fracture: Secondary | ICD-10-CM

## 2018-09-09 DIAGNOSIS — Z8612 Personal history of poliomyelitis: Secondary | ICD-10-CM

## 2018-09-09 DIAGNOSIS — M19072 Primary osteoarthritis, left ankle and foot: Secondary | ICD-10-CM

## 2018-09-09 DIAGNOSIS — D472 Monoclonal gammopathy: Secondary | ICD-10-CM

## 2018-09-09 DIAGNOSIS — Z8781 Personal history of (healed) traumatic fracture: Secondary | ICD-10-CM

## 2018-09-09 DIAGNOSIS — Z8719 Personal history of other diseases of the digestive system: Secondary | ICD-10-CM

## 2018-09-09 DIAGNOSIS — L80 Vitiligo: Secondary | ICD-10-CM

## 2018-09-09 DIAGNOSIS — M19042 Primary osteoarthritis, left hand: Secondary | ICD-10-CM

## 2018-09-09 DIAGNOSIS — E785 Hyperlipidemia, unspecified: Secondary | ICD-10-CM

## 2018-09-09 DIAGNOSIS — M19071 Primary osteoarthritis, right ankle and foot: Secondary | ICD-10-CM

## 2018-09-09 DIAGNOSIS — M353 Polymyalgia rheumatica: Secondary | ICD-10-CM

## 2018-09-09 DIAGNOSIS — R5383 Other fatigue: Secondary | ICD-10-CM

## 2018-09-09 DIAGNOSIS — M41125 Adolescent idiopathic scoliosis, thoracolumbar region: Secondary | ICD-10-CM

## 2018-09-09 DIAGNOSIS — Z8679 Personal history of other diseases of the circulatory system: Secondary | ICD-10-CM

## 2018-09-09 DIAGNOSIS — M19041 Primary osteoarthritis, right hand: Secondary | ICD-10-CM

## 2018-12-01 ENCOUNTER — Other Ambulatory Visit: Payer: Self-pay | Admitting: Rheumatology

## 2019-01-13 ENCOUNTER — Telehealth: Payer: Self-pay | Admitting: Rheumatology

## 2019-01-13 ENCOUNTER — Other Ambulatory Visit: Payer: Self-pay | Admitting: *Deleted

## 2019-01-13 DIAGNOSIS — M353 Polymyalgia rheumatica: Secondary | ICD-10-CM

## 2019-01-13 MED ORDER — HYDROXYCHLOROQUINE SULFATE 200 MG PO TABS: ORAL_TABLET | ORAL | 0 refills | Status: DC

## 2019-01-13 NOTE — Telephone Encounter (Signed)
Refill request received via fax  Last Visit: 09/09/18 Next Visit: due December 2020. Message sent to the front to schedule patient  Labs: 08/07/18 CBC WNL. Creatinine borderline low. Rest of CMP stable. Eye exam:  10/13/2018 Mild Macular degeneration No reason to d/c PLQ   Okay to refill per Dr. Estanislado Pandy

## 2019-01-13 NOTE — Telephone Encounter (Signed)
Patient left a message requesting a call back to let her know when she got her last Prolia injection. Patient needs to set up an appointment for dental work, but needs to know when last injection was before shoe sets up appointment. Please call to advise.

## 2019-01-13 NOTE — Telephone Encounter (Signed)
Patient advised her last Prolia injection was in May 2020.

## 2019-01-25 ENCOUNTER — Other Ambulatory Visit: Payer: Self-pay

## 2019-01-25 ENCOUNTER — Inpatient Hospital Stay (HOSPITAL_COMMUNITY): Payer: Medicare Other

## 2019-01-25 ENCOUNTER — Encounter (HOSPITAL_COMMUNITY): Payer: Self-pay | Admitting: Emergency Medicine

## 2019-01-25 ENCOUNTER — Inpatient Hospital Stay (HOSPITAL_COMMUNITY)
Admission: EM | Admit: 2019-01-25 | Discharge: 2019-01-28 | DRG: 392 | Disposition: A | Discharge status: Home / Self Care | Payer: Medicare Other | Attending: Internal Medicine | Admitting: Internal Medicine

## 2019-01-25 ENCOUNTER — Emergency Department (HOSPITAL_COMMUNITY): Payer: Medicare Other

## 2019-01-25 DIAGNOSIS — Z809 Family history of malignant neoplasm, unspecified: Secondary | ICD-10-CM

## 2019-01-25 DIAGNOSIS — E871 Hypo-osmolality and hyponatremia: Secondary | ICD-10-CM | POA: Diagnosis present

## 2019-01-25 DIAGNOSIS — Z79899 Other long term (current) drug therapy: Secondary | ICD-10-CM | POA: Diagnosis not present

## 2019-01-25 DIAGNOSIS — D472 Monoclonal gammopathy: Secondary | ICD-10-CM | POA: Diagnosis present

## 2019-01-25 DIAGNOSIS — E861 Hypovolemia: Secondary | ICD-10-CM | POA: Diagnosis present

## 2019-01-25 DIAGNOSIS — M353 Polymyalgia rheumatica: Secondary | ICD-10-CM | POA: Diagnosis present

## 2019-01-25 DIAGNOSIS — E860 Dehydration: Secondary | ICD-10-CM | POA: Diagnosis present

## 2019-01-25 DIAGNOSIS — K5732 Diverticulitis of large intestine without perforation or abscess without bleeding: Secondary | ICD-10-CM | POA: Diagnosis not present

## 2019-01-25 DIAGNOSIS — Z96612 Presence of left artificial shoulder joint: Secondary | ICD-10-CM | POA: Diagnosis present

## 2019-01-25 DIAGNOSIS — R627 Adult failure to thrive: Secondary | ICD-10-CM | POA: Diagnosis present

## 2019-01-25 DIAGNOSIS — Z8612 Personal history of poliomyelitis: Secondary | ICD-10-CM

## 2019-01-25 DIAGNOSIS — K219 Gastro-esophageal reflux disease without esophagitis: Secondary | ICD-10-CM | POA: Diagnosis present

## 2019-01-25 DIAGNOSIS — E7800 Pure hypercholesterolemia, unspecified: Secondary | ICD-10-CM | POA: Diagnosis present

## 2019-01-25 DIAGNOSIS — I10 Essential (primary) hypertension: Secondary | ICD-10-CM | POA: Diagnosis present

## 2019-01-25 DIAGNOSIS — R059 Cough, unspecified: Secondary | ICD-10-CM

## 2019-01-25 DIAGNOSIS — E785 Hyperlipidemia, unspecified: Secondary | ICD-10-CM | POA: Diagnosis present

## 2019-01-25 DIAGNOSIS — K5900 Constipation, unspecified: Secondary | ICD-10-CM | POA: Diagnosis present

## 2019-01-25 DIAGNOSIS — Z833 Family history of diabetes mellitus: Secondary | ICD-10-CM

## 2019-01-25 DIAGNOSIS — Z8719 Personal history of other diseases of the digestive system: Secondary | ICD-10-CM

## 2019-01-25 DIAGNOSIS — Z79890 Hormone replacement therapy (postmenopausal): Secondary | ICD-10-CM | POA: Diagnosis not present

## 2019-01-25 DIAGNOSIS — K5792 Diverticulitis of intestine, part unspecified, without perforation or abscess without bleeding: Secondary | ICD-10-CM | POA: Diagnosis present

## 2019-01-25 DIAGNOSIS — Z20828 Contact with and (suspected) exposure to other viral communicable diseases: Secondary | ICD-10-CM | POA: Diagnosis present

## 2019-01-25 DIAGNOSIS — R05 Cough: Secondary | ICD-10-CM | POA: Diagnosis not present

## 2019-01-25 LAB — COMPREHENSIVE METABOLIC PANEL
ALT: 32 U/L (ref 0–44)
AST: 31 U/L (ref 15–41)
Albumin: 4.4 g/dL (ref 3.5–5.0)
Alkaline Phosphatase: 60 U/L (ref 38–126)
Anion gap: 11 (ref 5–15)
BUN: 17 mg/dL (ref 8–23)
CO2: 22 mmol/L (ref 22–32)
Calcium: 9.5 mg/dL (ref 8.9–10.3)
Chloride: 92 mmol/L — ABNORMAL LOW (ref 98–111)
Creatinine, Ser: 0.69 mg/dL (ref 0.44–1.00)
GFR calc Af Amer: >60 mL/min (ref >60)
GFR calc non Af Amer: >60 mL/min (ref >60)
Glucose, Bld: 114 mg/dL — ABNORMAL HIGH (ref 70–99)
Potassium: 4.3 mmol/L (ref 3.5–5.1)
Sodium: 125 mmol/L — ABNORMAL LOW (ref 135–145)
Total Bilirubin: 0.3 mg/dL (ref 0.3–1.2)
Total Protein: 7.7 g/dL (ref 6.5–8.1)

## 2019-01-25 LAB — CBC
HCT: 41.6 % (ref 36.0–46.0)
Hemoglobin: 13.8 g/dL (ref 12.0–15.0)
MCH: 29.7 pg (ref 26.0–34.0)
MCHC: 33.2 g/dL (ref 30.0–36.0)
MCV: 89.5 fL (ref 80.0–100.0)
Platelets: 241 10*3/uL (ref 150–400)
RBC: 4.65 MIL/uL (ref 3.87–5.11)
RDW: 12.9 % (ref 11.5–15.5)
WBC: 9.8 10*3/uL (ref 4.0–10.5)
nRBC: 0 % (ref 0.0–0.2)

## 2019-01-25 LAB — URINALYSIS, ROUTINE W REFLEX MICROSCOPIC
Bilirubin Urine: NEGATIVE
Glucose, UA: NEGATIVE mg/dL
Hgb urine dipstick: NEGATIVE
Ketones, ur: 5 mg/dL — AB
Leukocytes,Ua: NEGATIVE
Nitrite: NEGATIVE
Protein, ur: NEGATIVE mg/dL
Specific Gravity, Urine: 1.015 (ref 1.005–1.030)
pH: 6 (ref 5.0–8.0)

## 2019-01-25 LAB — LIPASE, BLOOD: Lipase: 33 U/L (ref 11–51)

## 2019-01-25 MED ORDER — IOHEXOL 300 MG/ML  SOLN
100.0000 mL | Freq: Once | INTRAMUSCULAR | Status: AC | PRN
  Administered 2019-01-25: 100 mL via INTRAVENOUS

## 2019-01-25 MED ORDER — METRONIDAZOLE IN NACL 5-0.79 MG/ML-% IV SOLN
500.0000 mg | Freq: Once | INTRAVENOUS | Status: AC
  Administered 2019-01-26: 500 mg via INTRAVENOUS
  Filled 2019-01-25: qty 100

## 2019-01-25 MED ORDER — SODIUM CHLORIDE 0.9 % IV BOLUS
500.0000 mL | Freq: Once | INTRAVENOUS | Status: AC
  Administered 2019-01-25: 500 mL via INTRAVENOUS

## 2019-01-25 MED ORDER — CIPROFLOXACIN IN D5W 400 MG/200ML IV SOLN
400.0000 mg | Freq: Once | INTRAVENOUS | Status: AC
  Administered 2019-01-25: 400 mg via INTRAVENOUS
  Filled 2019-01-25: qty 200

## 2019-01-25 MED ORDER — SODIUM CHLORIDE (PF) 0.9 % IJ SOLN
INTRAMUSCULAR | Status: AC
  Administered 2019-01-26: 02:00:00
  Filled 2019-01-25: qty 50

## 2019-01-25 MED ORDER — SODIUM CHLORIDE 0.9% FLUSH
3.0000 mL | Freq: Once | INTRAVENOUS | Status: AC
  Administered 2019-01-25: 3 mL via INTRAVENOUS

## 2019-01-25 NOTE — ED Triage Notes (Addendum)
Pt verbalizes continued abdominal pain post treatment for diverticulitis (started treatment last Wednesday); some nausea. Also verbalizes constipation with last BM Friday.

## 2019-01-25 NOTE — ED Provider Notes (Signed)
[facility] DEPT Provider Note   CSN: [id number] Arrival date & time: 01/25/19  1437     History   Chief Complaint Chief Complaint  Patient presents with  . Abdominal Pain    HPI DARIE CARNAHAN is a 83 y.o. female.     Patient is an 83 year old female with past medical history of polymyalgia rheumatica, hypertension, and diverticulosis diagnosed by recent colonoscopy in August.  Patient states that for the past 10 days she has been experiencing episodes of abdominal cramping and urgency to have a bowel movement.  She states she then passes air and a small amount of mucus.  She denies any bloody or melanotic stool, or fever.  She was seen by her gastroenterologist, Dr. Oletta Lamas last Wednesday and was started on Bactrim, however this is not helping.  She called the office today and was instructed to come here for imaging studies.  The history is provided by the patient.  Abdominal Pain Pain location:  LLQ and suprapubic Pain quality: cramping   Pain radiates to:  Does not radiate Pain severity:  Moderate Duration:  10 days Timing:  Intermittent Progression:  Worsening Chronicity:  New Relieved by:  Nothing Worsened by:  Nothing   Past Medical History:  Diagnosis Date  . High cholesterol   . Hypertension   . Kyphosis   . PMR (polymyalgia rheumatica) (HCC)   . Polio   . Scoliosis   . Vitiligo     Patient Active Problem List   Diagnosis Date Noted  . History of humerus fracture 04/23/2016  . History of gastroesophageal reflux (GERD) 04/23/2016  . Positive ANA (antinuclear antibody) 04/23/2016  . History of poliomyelitis 04/23/2016  . Vitiligo 04/23/2016  . Scoliosis 04/23/2016  . Foreign body granuloma of skin and subcutaneous tissue 11/16/2015  . At risk for falling 03/24/2015  . Bronchitis 03/10/2014  . Encounter for general adult medical examination without abnormal findings 02/13/2014  . BMI (body mass index), pediatric, 5%  to less than 85% for age 23/07/2013  . Hypercholesterolemia 04/05/2013  . Benign hypertension 04/05/2013  . Polymyalgia rheumatica ([place]) 04/05/2013  . Other specified health status 04/05/2013  . Disease of skin and subcutaneous tissue 03/11/2013  . OP (osteoporosis) 03/11/2013  . 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 Esophagitis, reflux 03/11/2013  . Neuralgia neuritis, sciatic nerve 03/11/2013  . Encounter for screening for eye and ear disorders 03/11/2013  . MGUS (monoclonal gammopathy of unknown significance) 11/01/2011    Past Surgical History:  Procedure Laterality Date  . ABDOMINAL HYSTERECTOMY  1993  . SHOULDER FUSION SURGERY Left    took a piece of hip and fused it into left shoulder  . TOTAL SHOULDER ARTHROPLASTY       OB History   No obstetric history on file.      Home Medications    Prior to Admission medications   Medication Sig Start Date End Date Taking? Authorizing Provider  acetaminophen (TYLENOL) 500 MG tablet Take 500 mg by mouth every 6 (six) hours as needed for moderate pain or headache.    [provider]  atorvastatin (LIPITOR) 20 MG tablet Take 20 mg by mouth daily. 04/16/17   [provider]  Biotin 2500 MCG CAPS Take by mouth.    [provider]  calcium carbonate (OS-CAL) 1250 (500 Ca) MG chewable tablet Chew by mouth.    [provider]  calcium carbonate (TUMS - DOSED IN MG ELEMENTAL CALCIUM) 500 MG chewable tablet Chew  1 tablet by mouth daily as needed for indigestion or heartburn.    [provider]  cholecalciferol (VITAMIN D) 1000 UNITS tablet Take 1,000 Units by mouth daily.    [provider]  conjugated estrogens (PREMARIN) vaginal cream Place 1 Applicatorful vaginally as needed. 01/25/15   [provider]  denosumab (PROLIA) 60 MG/ML SOSY injection INJECT 60MG  SUBCUTANEOUSLY  EVERY 6 MONTHS (GIVEN AT  PRESCRIBERS OFFICE) 07/29/18   Bo Merino, MD   hydroxychloroquine (PLAQUENIL) 200 MG tablet TAKE 1 TABLET BY MOUTH EVERY OTHER DAY 01/13/19   Bo Merino, MD  metoprolol succinate (TOPROL-XL) 25 MG 24 hr tablet Take by mouth daily.  05/22/17   [provider]  Multiple Vitamin (MULTIVITAMIN) capsule Take by mouth as needed.     [provider]  omeprazole (PRILOSEC OTC) 20 MG tablet Take 20 mg by mouth daily as needed (heartburn).    [provider]  Polyethyl Glycol-Propyl Glycol (SYSTANE OP) Apply 1 drop to eye daily as needed (dry eyes).    [provider]  telmisartan (MICARDIS) 20 MG tablet Take 20 mg by mouth daily.    [provider]    Family History Family History  Problem Relation Age of Onset  . Diabetes Mother   . Cancer Brother     Social History Social History   Tobacco Use  . Smoking status: Never Smoker  . Smokeless tobacco: Never Used  Substance Use Topics  . Alcohol use: Yes    Alcohol/week: 7.0 standard drinks    Types: 7 Glasses of wine per week  . Drug use: Never     Allergies   Other and Tetracyclines & related   Review of Systems Review of Systems  Gastrointestinal: Positive for abdominal pain.  All other systems reviewed and are negative.    Physical Exam Updated Vital Signs BP (!) 168/69 (BP Location: Right Arm)   Pulse 77   Temp 98.4 F (36.9 C) (Oral)   Resp 14   Ht 5' (1.524 m)   Wt 43.1 kg   SpO2 98%   BMI 18.55 kg/m   Physical Exam Vitals signs and nursing note reviewed.  Constitutional:      General: She is not in acute distress.    Appearance: She is well-developed. She is not diaphoretic.  HENT:     Head: Normocephalic and atraumatic.  Neck:     Musculoskeletal: Normal range of motion and neck supple.  Cardiovascular:     Rate and Rhythm: Normal rate and regular rhythm.     Heart sounds: No murmur. No friction rub. No gallop.   Pulmonary:     Effort: Pulmonary effort is normal. No respiratory distress.     Breath  sounds: Normal breath sounds. No wheezing.  Abdominal:     General: Bowel sounds are normal. There is no distension.     Palpations: Abdomen is soft.     Tenderness: There is abdominal tenderness in the suprapubic area and left lower quadrant. There is no right CVA tenderness, left CVA tenderness, guarding or rebound.     Comments: There is mild tenderness to palpation in the suprapubic region and left lower quadrant.  Musculoskeletal: Normal range of motion.  Skin:    General: Skin is warm and dry.  Neurological:     Mental Status: She is alert and oriented to person, place, and time.      ED Treatments / Results  Labs (all labs ordered are listed, but only abnormal results  are displayed) Labs Reviewed  COMPREHENSIVE METABOLIC PANEL - Abnormal; Notable for the following components:      Result Value   Sodium 125 (*)    Chloride 92 (*)    Glucose, Bld 114 (*)    All other components within normal limits  URINALYSIS, ROUTINE W REFLEX MICROSCOPIC - Abnormal; Notable for the following components:   Ketones, ur 5 (*)    All other components within normal limits  LIPASE, BLOOD  CBC    EKG None  Radiology No results found.  Procedures Procedures (including critical care time)  Medications Ordered in ED Medications  sodium chloride flush (NS) 0.9 % injection 3 mL (has no administration in time range)  sodium chloride 0.9 % bolus 500 mL (has no administration in time range)     Initial Impression / Assessment and Plan / ED Course  I have reviewed the triage vital signs and the nursing notes.  Pertinent labs & imaging results that were available during my care of the patient were reviewed by me and considered in my medical decision making (see chart for details).  Patient presenting with complaints of abdominal pain.  She had diverticulosis found on recent colonoscopy.  She is currently taking Bactrim for presumed diverticulitis, however her symptoms are worsening.   Patient has no fever or white count, however it CT scan does show findings of acute diverticulitis involving the distal sigmoid colon and rectosigmoid junction.  There is associated marked wall thickening and luminal narrowing with proximal moderate to large stool burden suspicious for possible functional obstruction.  Patient will be admitted for IV antibiotics.  I have spoken with Dr. Roel Cluck who agrees to admit.  Final Clinical Impressions(s) / ED Diagnoses   Final diagnoses:  None    ED Discharge Orders    None       Veryl Speak, MD 01/25/19 2242

## 2019-01-25 NOTE — ED Notes (Signed)
Pt transported to CT ?

## 2019-01-25 NOTE — H&P (Signed)
Ellen Thomas [id number] DOB: 1935-12-24 DOA: 01/25/2019     PCP: Bernerd Limbo, MD   Outpatient Specialists:     Rheum dr. Rennis Chris  Patient arrived to ER on 01/25/19 at 1437  Patient coming from: home Lives With family    Chief Complaint:   Chief Complaint  Patient presents with  . Abdominal Pain    HPI: Ellen Thomas is a 83 y.o. female with medical history significant of hypertension, HLD, polymyalgia rheumatica, history of polio    Presented with steak was diagnosed with diverticulitis and started treatment 6 days ago with Bactrim still has some nausea and significant constipation. Denies fever no chills, no blood Ellen stool. She had some aching abd pain Ellen the lower abd Presented to emergency department with abdominal cramping and urgency she passes gas and some mucus no blood or melena. She has not been eating well   Infectious risk factors:  Reports  N/V/  Ellen  ER RAPID COVID TEST Ellen house testing  Pending  No results found for: SARSCOV2NAA   Regarding pertinent Chronic problems:   polymyalgia rheumatica on Plaquenil   Hyperlipidemia -  on statins Lipitor   HTN on Toprol     While Ellen ER: Have sodium of 125 CT abdomen showing acute diverticulitis with wall thickening and luminal narrowing no abscess or perforation moderate to large stool burden Ellen the colon  The following Work up has been ordered so far:  Orders Placed This Encounter  Procedures  . CT ABDOMEN PELVIS W CONTRAST  . Lipase, blood  . Comprehensive metabolic panel  . CBC  . Urinalysis, Routine w reflex microscopic  . Diet NPO time specified  . Saline Lock IV, Maintain IV access  . Consult to hospitalist  ALL PATIENTS BEING ADMITTED/HAVING PROCEDURES NEED COVID-19 SCREENING  . Saline lock IV    Following Medications were ordered Ellen ER: Medications  sodium chloride (PF) 0.9 % injection (has no administration Ellen time range)  sodium chloride flush (NS) 0.9 % injection 3  mL (3 mLs Intravenous Given 01/25/19 1947)  sodium chloride 0.9 % bolus 500 mL (0 mLs Intravenous Stopped 01/25/19 2021)  iohexol (OMNIPAQUE) 300 MG/ML solution 100 mL (100 mLs Intravenous Contrast Given 01/25/19 2041)        Consult Orders  (From admission, onward)         Start     Ordered   01/25/19 2127  Consult to hospitalist  ALL PATIENTS BEING ADMITTED/HAVING PROCEDURES NEED COVID-19 SCREENING  Once    Comments: ALL PATIENTS BEING ADMITTED/HAVING PROCEDURES NEED COVID-19 SCREENING  Provider:  (Not yet assigned)  Question Answer Comment  Place call to: [facility]   Reason for Consult Admit      01/25/19 2126           Significant initial  Findings: Abnormal Labs Reviewed  COMPREHENSIVE METABOLIC PANEL - Abnormal; Notable for the following components:      Result Value   Sodium 125 (*)    Chloride 92 (*)    Glucose, Bld 114 (*)    All other components within normal limits  URINALYSIS, ROUTINE W REFLEX MICROSCOPIC - Abnormal; Notable for the following components:   Ketones, ur 5 (*)    All other components within normal limits     Otherwise labs showing:    Recent Labs  Lab 01/25/19 1508  NA 125*  K 4.3  CO2 22  GLUCOSE 114*  BUN 17  CREATININE 0.69  CALCIUM 9.5  Cr   stable,    Lab Results  Component Value Date   CREATININE 0.69 01/25/2019   CREATININE 0.58 (L) 08/07/2018   CREATININE 0.64 02/17/2018    Recent Labs  Lab 01/25/19 1508  AST 31  ALT 32  ALKPHOS 60  BILITOT 0.3  PROT 7.7  ALBUMIN 4.4   Lab Results  Component Value Date   CALCIUM 9.5 01/25/2019   WBC      Component Value Date/Time   WBC 9.8 01/25/2019 1508   ANC    Component Value Date/Time   NEUTROABS 2,336 08/27/2018 0916   NEUTROABS 3.5 09/13/2013 1307     Plt: Lab Results  Component Value Date   PLT 241 01/25/2019     Lactic Acid, Venous No results found for: LATICACIDVEN   COVID-19 Labs    No results found for: SARSCOV2NAA     HG/HCT   Stable     Component Value Date/Time   HGB 13.8 01/25/2019 1508   HGB 13.7 09/13/2013 1307   HCT 41.6 01/25/2019 1508   HCT 42.9 09/13/2013 1307    Recent Labs  Lab 01/25/19 1508  LIPASE 33       ECG: not Ordered     UA  no evidence of UTI    Urine analysis:    Component Value Date/Time   COLORURINE YELLOW 01/25/2019 1852   APPEARANCEUR CLEAR 01/25/2019 1852   LABSPEC 1.015 01/25/2019 1852   PHURINE 6.0 01/25/2019 1852   GLUCOSEU NEGATIVE 01/25/2019 1852   HGBUR NEGATIVE 01/25/2019 1852   BILIRUBINUR NEGATIVE 01/25/2019 1852   KETONESUR 5 (A) 01/25/2019 1852   PROTEINUR NEGATIVE 01/25/2019 1852   NITRITE NEGATIVE 01/25/2019 1852   LEUKOCYTESUR NEGATIVE 01/25/2019 1852      Ordered    CXR -  NON acute  CTabd/pelvis - diverticultis       ED Triage Vitals  Enc Vitals Group     BP 01/25/19 1448 (!) 182/65     Pulse Rate 01/25/19 1448 79     Resp 01/25/19 1448 20     Temp 01/25/19 1448 98.4 F (36.9 C)     Temp Source 01/25/19 1448 Oral     SpO2 01/25/19 1448 99 %     Weight 01/25/19 1447 95 lb (43.1 kg)     Height 01/25/19 1447 5' (1.524 m)     Head Circumference --      Peak Flow --      Pain Score 01/25/19 1455 5     Pain Loc --      Pain Edu? --      Excl. Ellen [place]? --   TMAX(24)@       Latest  Blood pressure (!) 175/68, pulse 92, temperature 98.4 F (36.9 C), temperature source Oral, resp. rate 19, height 5' (1.524 m), weight 43.1 kg, SpO2 100 %.     Hospitalist was called for admission for diverticulitis and dehydration   Review of Systems:    Pertinent positives include:  abdominal pain, nausea, vomiting,  Constitutional:  No weight loss, night sweats, Fevers, chills, fatigue, weight loss  HEENT:  No headaches, Difficulty swallowing,Tooth/dental problems,Sore throat,  No sneezing, itching, ear ache, nasal congestion, post nasal drip,  Cardio-vascular:  No chest pain, Orthopnea, PND, anasarca, dizziness, palpitations.no Bilateral lower  extremity swelling  GI:  No heartburn, indigestion, diarrhea, change Ellen bowel habits, loss of appetite, melena, blood Ellen stool, hematemesis Resp:  no shortness of breath at rest. No dyspnea on exertion, No excess  mucus, no productive cough, No non-productive cough, No coughing up of blood.No change Ellen color of mucus.No wheezing. Skin:  no rash or lesions. No jaundice GU:  no dysuria, change Ellen color of urine, no urgency or frequency. No straining to urinate.  No flank pain.  Musculoskeletal:  No joint pain or no joint swelling. No decreased range of motion. No back pain.  Psych:  No change Ellen mood or affect. No depression or anxiety. No memory loss.  Neuro: no localizing neurological complaints, no tingling, no weakness, no double vision, no gait abnormality, no slurred speech, no confusion  All systems reviewed and apart from [place] all are negative  Past Medical History:   Past Medical History:  Diagnosis Date  . High cholesterol   . Hypertension   . Kyphosis   . PMR (polymyalgia rheumatica) (HCC)   . Polio   . Scoliosis   . Vitiligo       Past Surgical History:  Procedure Laterality Date  . ABDOMINAL HYSTERECTOMY  1993  . SHOULDER FUSION SURGERY Left    took a piece of hip and fused it into left shoulder  . TOTAL SHOULDER ARTHROPLASTY      Social History:  Ambulatory   Independently      reports that she has never smoked. She has never used smokeless tobacco. She reports current alcohol use of about 7.0 standard drinks of alcohol per week. She reports that she does not use drugs.   Family History:   Family History  Problem Relation Age of Onset  . Diabetes Mother   . Cancer Brother     Allergies: Allergies  Allergen Reactions  . Other     Cats  . Tetracyclines & Related     Mouth sores     Prior to Admission medications   Medication Sig Start Date End Date Taking? Authorizing Provider  acetaminophen (TYLENOL) 500 MG tablet Take 500 mg by mouth every  6 (six) hours as needed for moderate pain or headache.    [provider]  atorvastatin (LIPITOR) 20 MG tablet Take 20 mg by mouth daily. 04/16/17   [provider]  Biotin 2500 MCG CAPS Take by mouth.    [provider]  calcium carbonate (OS-CAL) 1250 (500 Ca) MG chewable tablet Chew by mouth.    [provider]  calcium carbonate (TUMS - DOSED Ellen MG ELEMENTAL CALCIUM) 500 MG chewable tablet Chew 1 tablet by mouth daily as needed for indigestion or heartburn.    [provider]  cholecalciferol (VITAMIN D) 1000 UNITS tablet Take 1,000 Units by mouth daily.    [provider]  conjugated estrogens (PREMARIN) vaginal cream Place 1 Applicatorful vaginally as needed. 01/25/15   [provider]  denosumab (PROLIA) 60 MG/ML SOSY injection INJECT 60MG  SUBCUTANEOUSLY  EVERY 6 MONTHS (GIVEN AT  PRESCRIBERS OFFICE) 07/29/18   Bo Merino, MD  hydroxychloroquine (PLAQUENIL) 200 MG tablet TAKE 1 TABLET BY MOUTH EVERY OTHER DAY 01/13/19   Bo Merino, MD  metoprolol succinate (TOPROL-XL) 25 MG 24 hr tablet Take by mouth daily.  05/22/17   [provider]  Multiple Vitamin (MULTIVITAMIN) capsule Take by mouth as needed.     [provider]  omeprazole (PRILOSEC OTC) 20 MG tablet Take 20 mg by mouth daily as needed (heartburn).    [provider]  Polyethyl Glycol-Propyl Glycol (SYSTANE OP) Apply 1 drop to eye daily as needed (dry eyes).    [provider]  telmisartan (MICARDIS) 20  MG tablet Take 20 mg by mouth daily.    [provider]   Physical Exam: Blood pressure (!) 175/68, pulse 92, temperature 98.4 F (36.9 C), temperature source Oral, resp. rate 19, height 5' (1.524 m), weight 43.1 kg, SpO2 100 %. 1. General:  Ellen No  Acute distress   well  -appearing 2. Psychological: Alert and  Oriented 3. Head/ENT:     Dry Mucous Membranes                          Head Non traumatic, neck supple                            Poor Dentition 4. SKIN:  decreased Skin turgor,  Skin clean Dry and intact no rash 5. Heart: Regular rate and rhythm no  Murmur, no Rub or gallop 6. Lungs:   no wheezes or crackles   7. Abdomen: Soft, somewhat lower abdomen -tender, Non distended bowel sounds present 8. Lower extremities: no clubbing, cyanosis, no edema 9. Neurologically Grossly intact, moving all 4 extremities equally   10. MSK: Normal range of motion   All other LABS:     Recent Labs  Lab 01/25/19 1508  WBC 9.8  HGB 13.8  HCT 41.6  MCV 89.5  PLT 241     Recent Labs  Lab 01/25/19 1508  NA 125*  K 4.3  CL 92*  CO2 22  GLUCOSE 114*  BUN 17  CREATININE 0.69  CALCIUM 9.5     Recent Labs  Lab 01/25/19 1508  AST 31  ALT 32  ALKPHOS 60  BILITOT 0.3  PROT 7.7  ALBUMIN 4.4       Cultures: No results found for: SDES, SPECREQUEST, CULT, REPTSTATUS   Radiological Exams on Admission: Dg Chest 2 View  Result Date: 01/25/2019 CLINICAL DATA:  Diverticulitis EXAM: CHEST - 2 VIEW COMPARISON:  07/16/2016 FINDINGS: Cardiac shadows within normal limits. Aortic calcifications are noted. Elevation of left hemidiaphragm is seen. Lungs are clear. Degenerative changes of the left shoulder joint noted progressed from the prior study. IMPRESSION: No acute abnormality noted. Electronically Signed   By: Inez Catalina M.D.   On: 01/25/2019 23:22   Ct Abdomen Pelvis W Contrast  Result Date: 01/25/2019 CLINICAL DATA:  83 year old who is currently on day 5 of treatment for diverticulitis, presenting with persistent generalized abdominal pain, nausea and constipation. EXAM: CT ABDOMEN AND PELVIS WITH CONTRAST TECHNIQUE: Multidetector CT imaging of the abdomen and pelvis was performed using the standard protocol following bolus administration of intravenous contrast. CONTRAST:  158mL OMNIPAQUE IOHEXOL 300 MG/ML IV. COMPARISON:  None. FINDINGS: Lower chest: Elevation of the LEFT hemidiaphragm.  Visualized lung bases clear. Heart size normal. Mild LAD and RIGHT coronary atherosclerosis. Hepatobiliary: Benign approximate 1.3 cm cyst involving the anterior segment RIGHT lobe of liver. No significant focal hepatic parenchymal abnormality. Gallbladder normal Ellen appearance without calcified gallstones. No biliary ductal dilation. Pancreas: Atrophic pancreatic head. Normal appearing pancreatic body and tail. No peripancreatic edema. Spleen: Normal Ellen size and appearance. Adrenals/Urinary Tract: Normal appearing adrenal glands. Benign cortical cysts involving the RIGHT kidney. No significant parenchymal abnormality involving either kidney. No hydronephrosis. No urinary tract calculi. Normal appearing urinary bladder. Stomach/Bowel: Stomach normal Ellen appearance for the degree of distention. Normal-appearing small bowel. Descending and sigmoid colon diverticulosis. Diverticulitis involving the distal sigmoid colon at the rectosigmoid junction, associated with marked wall thickening and luminal  narrowing. Liquid stool Ellen the rectum. Moderate to large stool burden Ellen the colon. Appendix not conspicuous, but no pericecal inflammation. Vascular/Lymphatic: Severe aortoiliofemoral atherosclerosis without evidence of aneurysm. Normal-appearing portal venous and systemic venous systems. No pathologic lymphadenopathy. Reproductive: Surgically absent uterus. No adnexal masses. Other: Pessary device Ellen the low pelvis. Musculoskeletal: Thoracic dextroscoliosis and compensatory lumbar levoscoliosis. Mild compression fracture of the upper endplate of L2 which does not appear acute. IMPRESSION: 1. Acute diverticulitis involving the distal sigmoid colon at the rectosigmoid junction, associated with marked wall thickening and marked luminal narrowing. No evidence of abscess or perforation. 2. Moderate to large stool burden Ellen the colon, query functional obstruction due to the narrowed sigmoid colon lumen. 3. Descending and sigmoid  colon diverticulosis. Aortic Atherosclerosis (ICD10-I70.0). Electronically Signed   By: Evangeline Dakin M.D.   On: 01/25/2019 21:00    Chart has been reviewed    Assessment/Plan  83 y.o. female with medical history significant of hypertension, HLD, polymyalgia rheumatica, history of polio     Admitted for diverticulitis and dehydration  Present on Admission: . Diverticulitis-    no Evidence of perforation - Bowel rest clear liquid   - Will rehydrate  - Continue IV antibiotics as patient was unable to tolerate p.o. due to significant nausea                  started on metronidazole  cipro  on 01/25/19 Given luminal narrowing with increased stool burden would benefit form GI consult Ellen AM    . Hypercholesterolemia -chronic stable continue home medications when able to tolerate  . Benign hypertension -stable continue metoprolol change to BID dosing while acute illness  . Polymyalgia rheumatica (HCC) -hold off on Plaquenil given ongoing infection  . Dehydration -rehydrate and follow  . Hyponatremia likely Ellen the setting of dehydration will rehydrate and follow fluid status check chest x-ray and TSH  . MGUS (monoclonal gammopathy of unknown significance) - lost to follow up but was stable Ellen 2016   Other plan as per orders.  DVT prophylaxis:  SCD     Code Status:  FULL CODE  as per patient   I had personally discussed CODE STATUS with patient    Family Communication:   Family not at  Bedside   Disposition Plan:     To home once workup is complete and patient is stable                                        Consults called:  none  Admission status:  ED Disposition    None       inpatient     Expect 2 midnight stay secondary to severity of patient's current illness including   Severe lab/radiological/exam abnormalities including:  diverticultis   and extensive comorbidities including: History of immunosuppression That are currently affecting medical management.    I expect  patient to be hospitalized for 2 midnights requiring inpatient medical care.  Patient is at high risk for adverse outcome (such as loss of life or disability) if not treated.  Indication for inpatient stay as follows:    severe pain requiring acute inpatient management,  inability to maintain oral hydration    Need for IV antibiotics, IV fluids,     Level of care       medical floor       Precautions:    No  active isolations  PPE: Used by the provider:   P100  eye Goggles,  Gloves   Ellen Thomas 01/25/2019, 10:20 PM    [facility]     after 2 AM please page floor coverage PA If 7AM-7PM, please contact the day team taking care of the patient using [email or web address]

## 2019-01-25 NOTE — ED Notes (Signed)
Pt able to ambulate without assistance to the restroom with a steady gait

## 2019-01-26 ENCOUNTER — Telehealth: Payer: Self-pay | Admitting: Pharmacist

## 2019-01-26 DIAGNOSIS — I10 Essential (primary) hypertension: Secondary | ICD-10-CM | POA: Diagnosis not present

## 2019-01-26 DIAGNOSIS — K5792 Diverticulitis of intestine, part unspecified, without perforation or abscess without bleeding: Secondary | ICD-10-CM | POA: Diagnosis not present

## 2019-01-26 DIAGNOSIS — E860 Dehydration: Secondary | ICD-10-CM | POA: Diagnosis not present

## 2019-01-26 DIAGNOSIS — Z8719 Personal history of other diseases of the digestive system: Secondary | ICD-10-CM | POA: Diagnosis not present

## 2019-01-26 LAB — CBC
HCT: 38.7 % (ref 36.0–46.0)
Hemoglobin: 12.7 g/dL (ref 12.0–15.0)
MCH: 29.3 pg (ref 26.0–34.0)
MCHC: 32.8 g/dL (ref 30.0–36.0)
MCV: 89.2 fL (ref 80.0–100.0)
Platelets: 211 10*3/uL (ref 150–400)
RBC: 4.34 MIL/uL (ref 3.87–5.11)
RDW: 12.9 % (ref 11.5–15.5)
WBC: 8.5 10*3/uL (ref 4.0–10.5)
nRBC: 0 % (ref 0.0–0.2)

## 2019-01-26 LAB — COMPREHENSIVE METABOLIC PANEL
ALT: 26 U/L (ref 0–44)
AST: 24 U/L (ref 15–41)
Albumin: 3.9 g/dL (ref 3.5–5.0)
Alkaline Phosphatase: 55 U/L (ref 38–126)
Anion gap: 10 (ref 5–15)
BUN: 9 mg/dL (ref 8–23)
CO2: 22 mmol/L (ref 22–32)
Calcium: 8.7 mg/dL — ABNORMAL LOW (ref 8.9–10.3)
Chloride: 97 mmol/L — ABNORMAL LOW (ref 98–111)
Creatinine, Ser: 0.48 mg/dL (ref 0.44–1.00)
GFR calc Af Amer: >60 mL/min (ref >60)
GFR calc non Af Amer: >60 mL/min (ref >60)
Glucose, Bld: 108 mg/dL — ABNORMAL HIGH (ref 70–99)
Potassium: 3.4 mmol/L — ABNORMAL LOW (ref 3.5–5.1)
Sodium: 129 mmol/L — ABNORMAL LOW (ref 135–145)
Total Bilirubin: 0.4 mg/dL (ref 0.3–1.2)
Total Protein: 6.9 g/dL (ref 6.5–8.1)

## 2019-01-26 LAB — MAGNESIUM: Magnesium: 1.9 mg/dL (ref 1.7–2.4)

## 2019-01-26 LAB — CREATININE, URINE, RANDOM: Creatinine, Urine: 75.88 mg/dL

## 2019-01-26 LAB — PHOSPHORUS: Phosphorus: 2.6 mg/dL (ref 2.5–4.6)

## 2019-01-26 LAB — TSH: TSH: 3.719 u[IU]/mL (ref 0.350–4.500)

## 2019-01-26 LAB — SODIUM, URINE, RANDOM: Sodium, Ur: 41 mmol/L

## 2019-01-26 LAB — SARS CORONAVIRUS 2 (TAT 6-24 HRS): SARS Coronavirus 2: NEGATIVE

## 2019-01-26 LAB — OSMOLALITY, URINE: Osmolality, Ur: 456 mOsm/kg (ref 300–900)

## 2019-01-26 MED ORDER — ACETAMINOPHEN 650 MG RE SUPP: 650.0000 mg | Freq: Four times a day (QID) | RECTAL | Status: DC | PRN

## 2019-01-26 MED ORDER — POLYETHYLENE GLYCOL 3350 17 G PO PACK
17.0000 g | PACK | Freq: Three times a day (TID) | ORAL | Status: DC
  Administered 2019-01-26: 17 g via ORAL
  Filled 2019-01-26: qty 1

## 2019-01-26 MED ORDER — CIPROFLOXACIN IN D5W 400 MG/200ML IV SOLN
400.0000 mg | Freq: Two times a day (BID) | INTRAVENOUS | Status: DC
  Administered 2019-01-26 – 2019-01-27 (×4): 400 mg via INTRAVENOUS
  Filled 2019-01-26 (×5): qty 200

## 2019-01-26 MED ORDER — ONDANSETRON HCL 4 MG/2ML IJ SOLN: 4.0000 mg | Freq: Four times a day (QID) | INTRAMUSCULAR | Status: DC | PRN

## 2019-01-26 MED ORDER — MORPHINE SULFATE (PF) 2 MG/ML IV SOLN: 2.0000 mg | INTRAVENOUS | Status: DC | PRN

## 2019-01-26 MED ORDER — IRBESARTAN 150 MG PO TABS
150.0000 mg | ORAL_TABLET | Freq: Every day | ORAL | Status: DC
  Administered 2019-01-26 – 2019-01-28 (×3): 150 mg via ORAL
  Filled 2019-01-26 (×3): qty 1

## 2019-01-26 MED ORDER — ATORVASTATIN CALCIUM 20 MG PO TABS
20.0000 mg | ORAL_TABLET | Freq: Every day | ORAL | Status: DC
  Administered 2019-01-26 – 2019-01-27 (×2): 20 mg via ORAL
  Filled 2019-01-26 (×2): qty 1

## 2019-01-26 MED ORDER — ONDANSETRON HCL 4 MG PO TABS: 4.0000 mg | ORAL_TABLET | Freq: Four times a day (QID) | ORAL | Status: DC | PRN

## 2019-01-26 MED ORDER — HYDROCODONE-ACETAMINOPHEN 5-325 MG PO TABS: 1.0000 | ORAL_TABLET | ORAL | Status: DC | PRN

## 2019-01-26 MED ORDER — SODIUM CHLORIDE 0.9 % IV SOLN
INTRAVENOUS | Status: AC
  Administered 2019-01-26: 02:00:00 via INTRAVENOUS

## 2019-01-26 MED ORDER — METRONIDAZOLE IN NACL 5-0.79 MG/ML-% IV SOLN
500.0000 mg | Freq: Three times a day (TID) | INTRAVENOUS | Status: DC
  Administered 2019-01-26 – 2019-01-28 (×6): 500 mg via INTRAVENOUS
  Filled 2019-01-26 (×6): qty 100

## 2019-01-26 MED ORDER — ACETAMINOPHEN 325 MG PO TABS: 650.0000 mg | ORAL_TABLET | Freq: Four times a day (QID) | ORAL | Status: DC | PRN

## 2019-01-26 MED ORDER — METOPROLOL TARTRATE 25 MG PO TABS
25.0000 mg | ORAL_TABLET | Freq: Two times a day (BID) | ORAL | Status: DC
  Administered 2019-01-26 – 2019-01-28 (×5): 25 mg via ORAL
  Filled 2019-01-26 (×7): qty 1

## 2019-01-26 MED ORDER — SODIUM CHLORIDE 0.9 % IV SOLN: INTRAVENOUS | Status: DC | PRN

## 2019-01-26 MED ORDER — POLYETHYLENE GLYCOL 3350 17 G PO PACK
17.0000 g | PACK | Freq: Two times a day (BID) | ORAL | Status: DC
  Administered 2019-01-27: 17 g via ORAL
  Filled 2019-01-26: qty 1

## 2019-01-26 MED ORDER — PANTOPRAZOLE SODIUM 40 MG PO TBEC: 40.0000 mg | DELAYED_RELEASE_TABLET | Freq: Every day | ORAL | Status: DC | PRN

## 2019-01-26 NOTE — ED Notes (Signed)
ED TO INPATIENT HANDOFF REPORT  Name/Age/Gender Ellen Thomas 83 y.o. female  Code Status    Code Status Orders  (From admission, onward)         Start     Ordered   01/26/19 0130  Full code  Continuous     01/26/19 0129        Code Status History    This patient has a current code status but no historical code status.   Advance Care Planning Activity    Advance Directive Documentation     Most Recent Value  Type of Advance Directive  Living will, Healthcare Power of Attorney  Pre-existing out of facility DNR order (yellow form or pink MOST form)  -  "MOST" Form in Place?  -      Home/SNF/Other Home  Chief Complaint lower pain  Level of Care/Admitting Diagnosis ED Disposition    ED Disposition Condition [place] [place] [[id number]]  Level of Care: Med-Surg [16]  Covid Evaluation: Asymptomatic Screening Protocol (No Symptoms)  Diagnosis: Diverticulitis [id number]  Admitting Physician: Toy Baker [[id number]]  Attending Physician: Toy Baker [[id number]]  Estimated length of stay: 3 - 4 days  Certification:: I certify this patient will need inpatient services for at least 2 midnights  PT Class (Do Not Modify): Inpatient [101]  PT Acc Code (Do Not Modify): Private [1]       Medical History Past Medical History:  Diagnosis Date  . High cholesterol   . Hypertension   . Kyphosis   . PMR (polymyalgia rheumatica) (HCC)   . Polio   . Scoliosis   . Vitiligo     Allergies Allergies  Allergen Reactions  . Other     Cats  . Tetracyclines & Related     Mouth sores    IV Location/Drains/Wounds Patient Lines/Drains/Airways Status   Active Line/Drains/Airways    Name:   Placement date:   Placement time:   Site:   Days:   Peripheral IV 01/25/19 Right Forearm   01/25/19    1946    Forearm   1          Labs/Imaging Results for orders placed or performed during the hospital encounter of 01/25/19  (from the past 48 hour(s))  Lipase, blood     Status: None   Collection Time: 01/25/19  3:08 PM  Result Value Ref Range   Lipase 33 11 - 51 U/L    Comment: Performed at [facility], [place] [address], [place], [place] [address]  Comprehensive metabolic panel     Status: Abnormal   Collection Time: 01/25/19  3:08 PM  Result Value Ref Range   Sodium 125 (L) 135 - 145 mmol/L   Potassium 4.3 3.5 - 5.1 mmol/L   Chloride 92 (L) 98 - 111 mmol/L   CO2 22 22 - 32 mmol/L   Glucose, Bld 114 (H) 70 - 99 mg/dL   BUN 17 8 - 23 mg/dL   Creatinine, Ser 0.69 0.44 - 1.00 mg/dL   Calcium 9.5 8.9 - 10.3 mg/dL   Total Protein 7.7 6.5 - 8.1 g/dL   Albumin 4.4 3.5 - 5.0 g/dL   AST 31 15 - 41 U/L   ALT 32 0 - 44 U/L   Alkaline Phosphatase 60 38 - 126 U/L   Total Bilirubin 0.3 0.3 - 1.2 mg/dL   GFR calc non Af Amer >60 >60 mL/min   GFR calc Af Amer >60 >  60 mL/min   Anion gap 11 5 - 15    Comment: Performed at [facility], [place] [address], [place], [place] [address]  CBC     Status: None   Collection Time: 01/25/19  3:08 PM  Result Value Ref Range   WBC 9.8 4.0 - 10.5 K/uL   RBC 4.65 3.87 - 5.11 MIL/uL   Hemoglobin 13.8 12.0 - 15.0 g/dL   HCT 41.6 36.0 - 46.0 %   MCV 89.5 80.0 - 100.0 fL   MCH 29.7 26.0 - 34.0 pg   MCHC 33.2 30.0 - 36.0 g/dL   RDW 12.9 11.5 - 15.5 %   Platelets 241 150 - 400 K/uL   nRBC 0.0 0.0 - 0.2 %    Comment: Performed at [facility], [place] [address] [place], [place] [address]  Urinalysis, Routine w reflex microscopic     Status: Abnormal   Collection Time: 01/25/19  6:52 PM  Result Value Ref Range   Color, Urine YELLOW YELLOW   APPearance CLEAR CLEAR   Specific Gravity, Urine 1.015 1.005 - 1.030   pH 6.0 5.0 - 8.0   Glucose, UA NEGATIVE NEGATIVE mg/dL   Hgb urine dipstick NEGATIVE NEGATIVE   Bilirubin Urine NEGATIVE NEGATIVE   Ketones, ur 5 (A) NEGATIVE mg/dL   Protein, ur NEGATIVE NEGATIVE mg/dL   Nitrite  NEGATIVE NEGATIVE   Leukocytes,Ua NEGATIVE NEGATIVE    Comment: Performed at [place] [address], [place], [place] [address]  Creatinine, urine, random     Status: None   Collection Time: 01/25/19 10:45 PM  Result Value Ref Range   Creatinine, Urine 75.88 mg/dL    Comment: Performed at [facility], [place] [address], [place], [place] [address]  Sodium, urine, random     Status: None   Collection Time: 01/25/19 10:45 PM  Result Value Ref Range   Sodium, Ur 41 mmol/L    Comment: Performed at [facility], [place] [address], [place], [place] [address]   Dg Chest 2 View  Result Date: 01/25/2019 CLINICAL DATA:  Diverticulitis EXAM: CHEST - 2 VIEW COMPARISON:  07/16/2016 FINDINGS: Cardiac shadows within normal limits. Aortic calcifications are noted. Elevation of left hemidiaphragm is seen. Lungs are clear. Degenerative changes of the left shoulder joint noted progressed from the prior study. IMPRESSION: No acute abnormality noted. Electronically Signed   By: Inez Catalina M.D.   On: 01/25/2019 23:22   Ct Abdomen Pelvis W Contrast  Result Date: 01/25/2019 CLINICAL DATA:  83 year old who is currently on day 5 of treatment for diverticulitis, presenting with persistent generalized abdominal pain, nausea and constipation. EXAM: CT ABDOMEN AND PELVIS WITH CONTRAST TECHNIQUE: Multidetector CT imaging of the abdomen and pelvis was performed using the standard protocol following bolus administration of intravenous contrast. CONTRAST:  169mL OMNIPAQUE IOHEXOL 300 MG/ML IV. COMPARISON:  None. FINDINGS: Lower chest: Elevation of the LEFT hemidiaphragm. Visualized lung bases clear. Heart size normal. Mild LAD and RIGHT coronary atherosclerosis. Hepatobiliary: Benign approximate 1.3 cm cyst involving the anterior segment RIGHT lobe of liver. No significant focal hepatic parenchymal abnormality. Gallbladder normal in appearance without calcified  gallstones. No biliary ductal dilation. Pancreas: Atrophic pancreatic head. Normal appearing pancreatic body and tail. No peripancreatic edema. Spleen: Normal in size and appearance. Adrenals/Urinary Tract: Normal appearing adrenal glands. Benign cortical cysts involving the RIGHT kidney. No significant parenchymal abnormality involving either kidney. No hydronephrosis. No urinary tract calculi. Normal appearing urinary bladder. Stomach/Bowel: Stomach  normal in appearance for the degree of distention. Normal-appearing small bowel. Descending and sigmoid colon diverticulosis. Diverticulitis involving the distal sigmoid colon at the rectosigmoid junction, associated with marked wall thickening and luminal narrowing. Liquid stool in the rectum. Moderate to large stool burden in the colon. Appendix not conspicuous, but no pericecal inflammation. Vascular/Lymphatic: Severe aortoiliofemoral atherosclerosis without evidence of aneurysm. Normal-appearing portal venous and systemic venous systems. No pathologic lymphadenopathy. Reproductive: Surgically absent uterus. No adnexal masses. Other: Pessary device in the low pelvis. Musculoskeletal: Thoracic dextroscoliosis and compensatory lumbar levoscoliosis. Mild compression fracture of the upper endplate of L2 which does not appear acute. IMPRESSION: 1. Acute diverticulitis involving the distal sigmoid colon at the rectosigmoid junction, associated with marked wall thickening and marked luminal narrowing. No evidence of abscess or perforation. 2. Moderate to large stool burden in the colon, query functional obstruction due to the narrowed sigmoid colon lumen. 3. Descending and sigmoid colon diverticulosis. Aortic Atherosclerosis (ICD10-I70.0). Electronically Signed   By: Evangeline Dakin M.D.   On: 01/25/2019 21:00    Pending Labs Unresulted Labs (From admission, onward)    Start     Ordered   01/26/19 0500  Magnesium  Tomorrow morning,   R    Comments: Call MD if  <1.5    01/26/19 0129   01/26/19 0500  Phosphorus  Tomorrow morning,   R     01/26/19 0129   01/26/19 0500  TSH  Once,   STAT    Comments: Cancel if already done within 1 month and notify MD    01/26/19 0129   01/26/19 0500  Comprehensive metabolic panel  Once,   STAT    Comments: Cal MD for K<3.5 or >5.0    01/26/19 0129   01/26/19 0500  CBC  Once,   STAT    Comments: Call for hg <8.0    01/26/19 0129   01/25/19 2234  SARS CORONAVIRUS 2 (TAT 6-24 HRS) Nasopharyngeal Nasopharyngeal Swab  (Asymptomatic/Tier 2 Patients Labs)  Once,   STAT    Question Answer Comment  Is this test for diagnosis or screening Screening   Symptomatic for COVID-19 as defined by CDC No   Hospitalized for COVID-19 No   Admitted to ICU for COVID-19 No   Previously tested for COVID-19 No   Resident in a congregate (group) care setting No   Employed in healthcare setting No   Pregnant No      01/25/19 2233   01/25/19 2213  Osmolality, urine  Once,   STAT     01/25/19 2212          Vitals/Pain Today's Vitals   01/26/19 0130 01/26/19 0212 01/26/19 0230 01/26/19 0300  BP: 140/68 (!) 172/72 (!) 148/73 (!) 145/67  Pulse: 84 88 86 85  Resp: 15 18 16 16   Temp:      TempSrc:      SpO2: 94% 96% 97% 95%  Weight:      Height:      PainSc:        Isolation Precautions No active isolations  Medications Medications  atorvastatin (LIPITOR) tablet 20 mg (has no administration in time range)  metoprolol tartrate (LOPRESSOR) tablet 25 mg (0 mg Oral Hold 01/26/19 0215)  pantoprazole (PROTONIX) EC tablet 40 mg (has no administration in time range)  acetaminophen (TYLENOL) tablet 650 mg (has no administration in time range)    Or  acetaminophen (TYLENOL) suppository 650 mg (has no administration in time range)  HYDROcodone-acetaminophen (NORCO/VICODIN) 5-325 MG per  tablet 1-2 tablet (has no administration in time range)  ondansetron (ZOFRAN) tablet 4 mg (has no administration in time range)    Or   ondansetron (ZOFRAN) injection 4 mg (has no administration in time range)  0.9 %  sodium chloride infusion ( Intravenous New Bag/Given 01/26/19 0209)  morphine 2 MG/ML injection 2 mg (has no administration in time range)  sodium chloride flush (NS) 0.9 % injection 3 mL (3 mLs Intravenous Given 01/25/19 1947)  sodium chloride 0.9 % bolus 500 mL (0 mLs Intravenous Stopped 01/25/19 2021)  iohexol (OMNIPAQUE) 300 MG/ML solution 100 mL (100 mLs Intravenous Contrast Given 01/25/19 2041)  sodium chloride (PF) 0.9 % injection (  Given by Other 01/26/19 0156)  ciprofloxacin (CIPRO) IVPB 400 mg (0 mg Intravenous Stopped 01/26/19 0032)  metroNIDAZOLE (FLAGYL) IVPB 500 mg (0 mg Intravenous Stopped 01/26/19 0140)    Mobility walks

## 2019-01-26 NOTE — ED Notes (Signed)
Pt ambulatory to bathroom with steady gait. No assistance needed.

## 2019-01-26 NOTE — Progress Notes (Signed)
PROGRESS NOTE    [name]  [id number] DOB: 1935/04/12 DOA: 01/25/2019 PCP: Bernerd Limbo, MD    Brief Narrative:  Ellen Thomas is a 83 y.o. female with medical history significant of hypertension, HLD, polymyalgia rheumatica, history of polio who presented with persistent abdominal pain and constipation.  Patient was recently seen by her GI physician Dr. Oletta Lamas and prescribed Bactrim for presumed diverticulitis with persistent nausea and no relief of her abdominal symptoms.  She recalled her GI office, who recommended evaluation in the ED. Denies fever no chills, no blood in stool.  Evaluation in the ED, notable for 98.2, HR 66, RR 18, BP 116/57, SPO2 100% on room air.  Sodium 125, potassium 4.3, chloride 92, CO2 22, glucose 114, BUN 17, creatinine 0.69.  AST 31, ALT 32, lipase 33, total bilirubin 0.3.  Urinalysis unrevealing.  CT abdomen/pelvis notable for acute diverticulitis distal sigmoid colon at the rectosigmoid junction associated with marked wall thickening and luminal narrowing with moderate to large stool burden throughout the colon. TRH consulted for admission given acute diverticulitis with failed outpatient antibiotics and significant constipation with concerns of functional obstruction secondary to narrowed sigmoid colon lumen.   Assessment & Plan:   Active Problems:   MGUS (monoclonal gammopathy of unknown significance)   Hypercholesterolemia   Benign hypertension   Polymyalgia rheumatica (HCC)   History of gastroesophageal reflux (GERD)   Dehydration   Diverticulitis   Hyponatremia   Acute diverticulitis with sigmoid luminal narrowing Patient presenting with persistent nausea, constipation and abdominal pain with failed outpatient antibiotics with Bactrim.  No bowel movement for 7 days.  CT abdomen/pelvis notable for acute diverticulitis distal sigmoid colon with marked wall thickening with associated luminal narrowing with concern for functional  obstruction with moderate to large stool burden; no abscess or perforation appreciated. --GI consulted, appreciate assistance --Continue IV antibiotics with ciprofloxacin 400 mg IV BID and Flagyl 500 mg IV q8hrs --Supportive care, antiemetics, antipyretics  Hyponatremia Sodium 125 on presentation.  Etiology likely hypovolemic hyponatremia given poor oral intake.  Urine sodium 41, urine osmolality 456 --Na 125-->129 --Normal saline at 100 mL's per hour --Repeat BMP in a.m.  Constipation Large stool but none noted on CT abdomen/pelvis likely complicated by luminal narrowing of the sigmoid colon from underlying diverticulitis. --MiraLAX BID --Closely monitor bowel movements  Adult failure to thrive Dehydration Patient with poor appetite and oral intake over the past week secondary to abdominal discomfort related to diverticulitis as above. --IV fluid hydration with NS at 100 mL's per hour --Full liquid diet for now  Essential hypertension --Continue home ARB with hospital substitution, metoprolol tartrate 25 mg p.o. twice daily  Polymyalgia rheumatica Follows with rheumatology outpatient, Dr. Rennis Chris.   HLD: Continue statin   DVT prophylaxis: SCDs, ambulation Code Status: Full code Family Communication: None Disposition Plan: Continue inpatient, IV antibiotics, IV fluids, further depend on clinical course, hopeful for discharge home in 1-2 days   Consultants:   [facility]; Dr. Harl Bowie  Procedures:   none  Antimicrobials:   Ciprofloxacin 10/26>>  Flagyl 10/26>>   Subjective: Patient seen and examined at bedside, resting comfortably.  Continues with mild lower quadrant abdominal discomfort, slightly improved since arrival.  Continues to complain of lack of bowel movement; only occasional mucus and one episode of bright red blood on toilet paper.  Also endorses occasional nausea.  No other complaints or concerns at this time.  Denies headache, no fever/chills/night  sweats, no vomiting/diarrhea, no chest pain, no palpitations, no shortness  of breath, no cough/congestion, no paresthesias.  No acute events overnight per nursing staff.  Objective: Vitals:   01/26/19 0300 01/26/19 0430 01/26/19 0500 01/26/19 1414  BP: (!) 145/67 (!) 164/76  (!) 116/57  Pulse: 85 91  66  Resp: 16 17  18   Temp:  98.9 F (37.2 C)  98.2 F (36.8 C)  TempSrc:  Oral  Oral  SpO2: 95% 100%  100%  Weight:   43.8 kg   Height:        Intake/Output Summary (Last 24 hours) at 01/26/2019 1638 Last data filed at 01/26/2019 1544 Gross per 24 hour  Intake 1032.9 ml  Output -  Net 1032.9 ml   Filed Weights   01/25/19 1447 01/26/19 0500  Weight: 43.1 kg 43.8 kg    Examination:  General exam: Appears calm and comfortable  Respiratory system: Clear to auscultation. Respiratory effort normal. Cardiovascular system: S1 & S2 heard, RRR. No JVD, murmurs, rubs, gallops or clicks. No pedal edema. Gastrointestinal system: Abdomen is nondistended, soft, mild bilateral lower quadrant tenderness on deep palpation without rebound/guarding/masses. No organomegaly. Normal bowel sounds heard. Central nervous system: Alert and oriented. No focal neurological deficits. Extremities: Symmetric 5 x 5 power. Skin: No rashes, lesions or ulcers Psychiatry: Judgement and insight appear normal. Mood & affect appropriate.     Data Reviewed: I have personally reviewed following labs and imaging studies  CBC: Recent Labs  Lab 01/25/19 1508 01/26/19 0546  WBC 9.8 8.5  HGB 13.8 12.7  HCT 41.6 38.7  MCV 89.5 89.2  PLT 241 [id number]   Basic Metabolic Panel: Recent Labs  Lab 01/25/19 1508 01/26/19 0546  NA 125* 129*  K 4.3 3.4*  CL 92* 97*  CO2 22 22  GLUCOSE 114* 108*  BUN 17 9  CREATININE 0.69 0.48  CALCIUM 9.5 8.7*  MG  --  1.9  PHOS  --  2.6   GFR: Estimated Creatinine Clearance: 38.8 mL/min (by C-G formula based on SCr of 0.48 mg/dL). Liver Function Tests: Recent Labs  Lab  01/25/19 1508 01/26/19 0546  AST 31 24  ALT 32 26  ALKPHOS 60 55  BILITOT 0.3 0.4  PROT 7.7 6.9  ALBUMIN 4.4 3.9   Recent Labs  Lab 01/25/19 1508  LIPASE 33   No results for input(s): AMMONIA in the last 168 hours. Coagulation Profile: No results for input(s): INR, PROTIME in the last 168 hours. Cardiac Enzymes: No results for input(s): CKTOTAL, CKMB, CKMBINDEX, TROPONINI in the last 168 hours. BNP (last 3 results) No results for input(s): PROBNP in the last 8760 hours. HbA1C: No results for input(s): HGBA1C in the last 72 hours. CBG: No results for input(s): GLUCAP in the last 168 hours. Lipid Profile: No results for input(s): CHOL, HDL, LDLCALC, TRIG, CHOLHDL, LDLDIRECT in the last 72 hours. Thyroid Function Tests: Recent Labs    01/26/19 0546  TSH 3.719   Anemia Panel: No results for input(s): VITAMINB12, FOLATE, FERRITIN, TIBC, IRON, RETICCTPCT in the last 72 hours. Sepsis Labs: No results for input(s): PROCALCITON, LATICACIDVEN in the last 168 hours.  Recent Results (from the past 240 hour(s))  SARS CORONAVIRUS 2 (TAT 6-24 HRS) Nasopharyngeal Nasopharyngeal Swab     Status: None   Collection Time: 01/25/19 10:45 PM   Specimen: Nasopharyngeal Swab  Result Value Ref Range Status   SARS Coronavirus 2 NEGATIVE NEGATIVE Final    Comment: (NOTE) SARS-CoV-2 target nucleic acids are NOT DETECTED. The SARS-CoV-2 RNA is generally detectable in upper and lower  respiratory specimens during the acute phase of infection. Negative results do not preclude SARS-CoV-2 infection, do not rule out co-infections with other pathogens, and should not be used as the sole basis for treatment or other patient management decisions. Negative results must be combined with clinical observations, patient history, and epidemiological information. The expected result is Negative. Fact Sheet for Patients: SugarRoll.be Fact Sheet for Healthcare Providers:  https://www.woods-mathews.com/ This test is not yet approved or cleared by the [place] FDA and  has been authorized for detection and/or diagnosis of SARS-CoV-2 by FDA under an Emergency Use Authorization (EUA). This EUA will remain  in effect (meaning this test can be used) for the duration of the COVID-19 declaration under Section 56 4(b)(1) of the Act, 21 U.S.C. section 360bbb-3(b)(1), unless the authorization is terminated or revoked sooner. Performed at [facility], [place] [address] [place], [place] [address]          Radiology Studies: Dg Chest 2 View  Result Date: 01/25/2019 CLINICAL DATA:  Diverticulitis EXAM: CHEST - 2 VIEW COMPARISON:  07/16/2016 FINDINGS: Cardiac shadows within normal limits. Aortic calcifications are noted. Elevation of left hemidiaphragm is seen. Lungs are clear. Degenerative changes of the left shoulder joint noted progressed from the prior study. IMPRESSION: No acute abnormality noted. Electronically Signed   By: Inez Catalina M.D.   On: 01/25/2019 23:22   Ct Abdomen Pelvis W Contrast  Result Date: 01/25/2019 CLINICAL DATA:  83 year old who is currently on day 5 of treatment for diverticulitis, presenting with persistent generalized abdominal pain, nausea and constipation. EXAM: CT ABDOMEN AND PELVIS WITH CONTRAST TECHNIQUE: Multidetector CT imaging of the abdomen and pelvis was performed using the standard protocol following bolus administration of intravenous contrast. CONTRAST:  136mL OMNIPAQUE IOHEXOL 300 MG/ML IV. COMPARISON:  None. FINDINGS: Lower chest: Elevation of the LEFT hemidiaphragm. Visualized lung bases clear. Heart size normal. Mild LAD and RIGHT coronary atherosclerosis. Hepatobiliary: Benign approximate 1.3 cm cyst involving the anterior segment RIGHT lobe of liver. No significant focal hepatic parenchymal abnormality. Gallbladder normal in appearance without calcified gallstones. No biliary ductal dilation. Pancreas:  Atrophic pancreatic head. Normal appearing pancreatic body and tail. No peripancreatic edema. Spleen: Normal in size and appearance. Adrenals/Urinary Tract: Normal appearing adrenal glands. Benign cortical cysts involving the RIGHT kidney. No significant parenchymal abnormality involving either kidney. No hydronephrosis. No urinary tract calculi. Normal appearing urinary bladder. Stomach/Bowel: Stomach normal in appearance for the degree of distention. Normal-appearing small bowel. Descending and sigmoid colon diverticulosis. Diverticulitis involving the distal sigmoid colon at the rectosigmoid junction, associated with marked wall thickening and luminal narrowing. Liquid stool in the rectum. Moderate to large stool burden in the colon. Appendix not conspicuous, but no pericecal inflammation. Vascular/Lymphatic: Severe aortoiliofemoral atherosclerosis without evidence of aneurysm. Normal-appearing portal venous and systemic venous systems. No pathologic lymphadenopathy. Reproductive: Surgically absent uterus. No adnexal masses. Other: Pessary device in the low pelvis. Musculoskeletal: Thoracic dextroscoliosis and compensatory lumbar levoscoliosis. Mild compression fracture of the upper endplate of L2 which does not appear acute. IMPRESSION: 1. Acute diverticulitis involving the distal sigmoid colon at the rectosigmoid junction, associated with marked wall thickening and marked luminal narrowing. No evidence of abscess or perforation. 2. Moderate to large stool burden in the colon, query functional obstruction due to the narrowed sigmoid colon lumen. 3. Descending and sigmoid colon diverticulosis. Aortic Atherosclerosis (ICD10-I70.0). Electronically Signed   By: Evangeline Dakin M.D.   On: 01/25/2019 21:00        Scheduled Meds: . atorvastatin  20 mg Oral q1800  . irbesartan  150 mg Oral Daily  . metoprolol tartrate  25 mg Oral BID  . polyethylene glycol  17 g Oral TID   Continuous Infusions: . sodium  chloride    . ciprofloxacin Stopped (01/26/19 1122)  . metronidazole Stopped (01/26/19 1252)     LOS: 1 day    Time spent: 32 minutes spent on chart review, discussion with nursing staff, consultants, updating family and interview/physical exam; more than 50% of that time was spent in counseling and/or coordination of care.    Fawne Hughley J [place] DO [facility] Pager [phone]  If 7PM-7AM, please contact night-coverage [email or web address] Password [facility] 01/26/2019, 4:38 PM

## 2019-01-26 NOTE — Telephone Encounter (Signed)
Called to remind patient that she is due for her Prolia injection around 02/17/2019.  Informed that she was due to have a tooth extraction but is currently in the hospital with diverticulitis.  She wants to delay Prolia injection until after tooth extraction to minimize risk of ONJ.  Advised patient to contact our office once she is ready for her next Prolia injection.  Patient verbalized understanding.  Mariella Saa, PharmD, [place], [place] Clinical Specialty Pharmacist [phone]  01/26/2019 11:33 AM

## 2019-01-26 NOTE — Consult Note (Signed)
Referring Provider:  [place] Primary Care Physician:  Bernerd Limbo, MD Primary Gastroenterologist: Dr. Oletta Lamas  Reason for Consultation: Diverticulitis  HPI: Ellen Thomas is a 83 y.o. female with past medical history of polymyalgia rheumatica, history of polio, presented to the hospital with abdominal pain.  CT scan showed rectosigmoid diverticulitis.  GI is consulted for further evaluation.  Patient seen and examined at bedside.  Patient underwent surveillance colonoscopy on December 02, 2018 with Dr. Oletta Lamas.  She was found to have diverticulosis and internal hemorrhoids.  No polyps.  Patient was having intermittent abdominal discomfort after the procedure and was seen in the office last week.  Was diagnosed with clinical diverticulitis and was started on Bactrim.  Patient did not had any improvement in symptoms and she was advised to come to emergency room for further evaluation.   Patient is feeling somewhat better now.  Last bowel movement this morning.  Had seen some streaks of blood in the stool which has resolved now.  Complaining of acid reflux and nausea which has been going on since starting Bactrim.  Denies any dysphagia and odynophagia.     Past Medical History:  Diagnosis Date  . High cholesterol   . Hypertension   . Kyphosis   . PMR (polymyalgia rheumatica) (HCC)   . Polio   . Scoliosis   . Vitiligo     Past Surgical History:  Procedure Laterality Date  . ABDOMINAL HYSTERECTOMY  1993  . SHOULDER FUSION SURGERY Left    took a piece of hip and fused it into left shoulder  . TOTAL SHOULDER ARTHROPLASTY      Prior to Admission medications   Medication Sig Start Date End Date Taking? Authorizing Provider  acetaminophen (TYLENOL) 500 MG tablet Take 500 mg by mouth every 6 (six) hours as needed for moderate pain or headache.   Yes [provider]  atorvastatin (LIPITOR) 20 MG tablet Take 20 mg by mouth daily. 04/16/17  Yes [provider]  Biotin 1000  MCG tablet Take 1,000 mcg by mouth daily.   Yes [provider]  calcium carbonate (TUMS - DOSED IN MG ELEMENTAL CALCIUM) 500 MG chewable tablet Chew 1 tablet by mouth daily as needed for indigestion or heartburn.   Yes [provider]  cholecalciferol (VITAMIN D) 1000 UNITS tablet Take 1,000 Units by mouth daily.   Yes [provider]  conjugated estrogens (PREMARIN) vaginal cream Place 1 Applicatorful vaginally every 7 (seven) days.  01/25/15  Yes [provider]  denosumab (PROLIA) 60 MG/ML SOSY injection INJECT 60MG  SUBCUTANEOUSLY  EVERY 6 MONTHS (GIVEN AT  PRESCRIBERS OFFICE) 07/29/18  Yes Deveshwar, Abel Presto, MD  hydroxychloroquine (PLAQUENIL) 200 MG tablet TAKE 1 TABLET BY MOUTH EVERY OTHER DAY Patient taking differently: Take 200 mg by mouth every other day. In the evening 01/13/19  Yes Deveshwar, Abel Presto, MD  metoprolol succinate (TOPROL-XL) 25 MG 24 hr tablet Take 25 mg by mouth daily.  05/22/17  Yes [provider]  Multiple Vitamin (MULTIVITAMIN) capsule Take 1 capsule by mouth as needed.    Yes [provider]  omeprazole (PRILOSEC OTC) 20 MG tablet Take 20 mg by mouth daily as needed (heartburn).   Yes [provider]  Polyethyl Glycol-Propyl Glycol (SYSTANE OP) Apply 1 drop to eye daily as needed (dry eyes).   Yes [provider]  sulfamethoxazole-trimethoprim (BACTRIM DS) 800-160 MG tablet Take 1 tablet by mouth 2 (two) times daily. 01/20/19  Yes [provider]  telmisartan (MICARDIS) 20  MG tablet Take 20 mg by mouth 2 (two) times daily.    Yes [provider]    Scheduled Meds: . atorvastatin  20 mg Oral q1800  . irbesartan  150 mg Oral Daily  . metoprolol tartrate  25 mg Oral BID  . polyethylene glycol  17 g Oral TID   Continuous Infusions: . sodium chloride Stopped (01/26/19 0332)  . ciprofloxacin 400 mg (01/26/19 1008)  . metronidazole     PRN Meds:.acetaminophen **OR** acetaminophen,  HYDROcodone-acetaminophen, morphine injection, ondansetron **OR** ondansetron (ZOFRAN) IV, pantoprazole  Allergies as of 01/25/2019 - Review Complete 01/25/2019  Allergen Reaction Noted  . Other  02/14/2016  . Tetracyclines & related  12/22/2012    Family History  Problem Relation Age of Onset  . Diabetes Mother   . Cancer Brother     Social History   Socioeconomic History  . Marital status: Married    Spouse name: Not on file  . Number of children: Not on file  . Years of education: Not on file  . Highest education level: Not on file  Occupational History  . Not on file  Social Needs  . Financial resource strain: Not on file  . Food insecurity    Worry: Not on file    Inability: Not on file  . Transportation needs    Medical: Not on file    Non-medical: Not on file  Tobacco Use  . Smoking status: Never Smoker  . Smokeless tobacco: Never Used  Substance and Sexual Activity  . Alcohol use: Yes    Alcohol/week: 7.0 standard drinks    Types: 7 Glasses of wine per week  . Drug use: Never  . Sexual activity: Not on file  Lifestyle  . Physical activity    Days per week: Not on file    Minutes per session: Not on file  . Stress: Not on file  Relationships  . Social Herbalist on phone: Not on file    Gets together: Not on file    Attends religious service: Not on file    Active member of club or organization: Not on file    Attends meetings of clubs or organizations: Not on file    Relationship status: Not on file  . Intimate partner violence    Fear of current or ex partner: Not on file    Emotionally abused: Not on file    Physically abused: Not on file    Forced sexual activity: Not on file  Other Topics Concern  . Not on file  Social History Narrative  . Not on file    Review of Systems: Review of Systems  Constitutional: Negative for chills and fever.  HENT: Negative for hearing loss and tinnitus.   Eyes: Negative for blurred vision and  double vision.  Respiratory: Negative for cough and hemoptysis.   Cardiovascular: Negative for chest pain and palpitations.  Gastrointestinal: Positive for abdominal pain, blood in stool, heartburn and nausea.  Genitourinary: Negative for dysuria and urgency.  Musculoskeletal: Positive for joint pain and myalgias.  Skin: Negative for itching and rash.  Neurological: Negative for seizures and loss of consciousness.  Endo/Heme/Allergies: Does not bruise/bleed easily.  Psychiatric/Behavioral: Negative for hallucinations and substance abuse.    Physical Exam: Vital signs: Vitals:   01/26/19 0300 01/26/19 0430  BP: (!) 145/67 (!) 164/76  Pulse: 85 91  Resp: 16 17  Temp:  98.9 F (37.2 C)  SpO2: 95% 100%   Last BM  Date: 01/26/19 Physical Exam  Constitutional: She is oriented to person, place, and time. No distress.  HENT:  Head: Normocephalic and atraumatic.  Eyes: EOM are normal.  Neck: Normal range of motion. Neck supple.  Cardiovascular: Normal rate, regular rhythm and normal heart sounds.  Pulmonary/Chest: Effort normal. No respiratory distress.  Abdominal: Soft. Bowel sounds are normal. She exhibits no distension. There is abdominal tenderness. There is no rebound and no guarding.  Mild left lower quadrant tenderness to palpation  Musculoskeletal: Normal range of motion.        General: No edema.  Neurological: She is alert and oriented to person, place, and time.  Skin: Skin is warm. No erythema.  Psychiatric: She has a normal mood and affect. Judgment and thought content normal.    GI:  Lab Results: Recent Labs    01/25/19 1508 01/26/19 0546  WBC 9.8 8.5  HGB 13.8 12.7  HCT 41.6 38.7  PLT 241 211   BMET Recent Labs    01/25/19 1508 01/26/19 0546  NA 125* 129*  K 4.3 3.4*  CL 92* 97*  CO2 22 22  GLUCOSE 114* 108*  BUN 17 9  CREATININE 0.69 0.48  CALCIUM 9.5 8.7*   LFT Recent Labs    01/26/19 0546  PROT 6.9  ALBUMIN 3.9  AST 24  ALT 26  ALKPHOS  55  BILITOT 0.4   PT/INR No results for input(s): LABPROT, INR in the last 72 hours.   Studies/Results: Dg Chest 2 View  Result Date: 01/25/2019 CLINICAL DATA:  Diverticulitis EXAM: CHEST - 2 VIEW COMPARISON:  07/16/2016 FINDINGS: Cardiac shadows within normal limits. Aortic calcifications are noted. Elevation of left hemidiaphragm is seen. Lungs are clear. Degenerative changes of the left shoulder joint noted progressed from the prior study. IMPRESSION: No acute abnormality noted. Electronically Signed   By: Inez Catalina M.D.   On: 01/25/2019 23:22   Ct Abdomen Pelvis W Contrast  Result Date: 01/25/2019 CLINICAL DATA:  83 year old who is currently on day 5 of treatment for diverticulitis, presenting with persistent generalized abdominal pain, nausea and constipation. EXAM: CT ABDOMEN AND PELVIS WITH CONTRAST TECHNIQUE: Multidetector CT imaging of the abdomen and pelvis was performed using the standard protocol following bolus administration of intravenous contrast. CONTRAST:  153mL OMNIPAQUE IOHEXOL 300 MG/ML IV. COMPARISON:  None. FINDINGS: Lower chest: Elevation of the LEFT hemidiaphragm. Visualized lung bases clear. Heart size normal. Mild LAD and RIGHT coronary atherosclerosis. Hepatobiliary: Benign approximate 1.3 cm cyst involving the anterior segment RIGHT lobe of liver. No significant focal hepatic parenchymal abnormality. Gallbladder normal in appearance without calcified gallstones. No biliary ductal dilation. Pancreas: Atrophic pancreatic head. Normal appearing pancreatic body and tail. No peripancreatic edema. Spleen: Normal in size and appearance. Adrenals/Urinary Tract: Normal appearing adrenal glands. Benign cortical cysts involving the RIGHT kidney. No significant parenchymal abnormality involving either kidney. No hydronephrosis. No urinary tract calculi. Normal appearing urinary bladder. Stomach/Bowel: Stomach normal in appearance for the degree of distention. Normal-appearing  small bowel. Descending and sigmoid colon diverticulosis. Diverticulitis involving the distal sigmoid colon at the rectosigmoid junction, associated with marked wall thickening and luminal narrowing. Liquid stool in the rectum. Moderate to large stool burden in the colon. Appendix not conspicuous, but no pericecal inflammation. Vascular/Lymphatic: Severe aortoiliofemoral atherosclerosis without evidence of aneurysm. Normal-appearing portal venous and systemic venous systems. No pathologic lymphadenopathy. Reproductive: Surgically absent uterus. No adnexal masses. Other: Pessary device in the low pelvis. Musculoskeletal: Thoracic dextroscoliosis and compensatory lumbar levoscoliosis. Mild compression fracture of the  upper endplate of L2 which does not appear acute. IMPRESSION: 1. Acute diverticulitis involving the distal sigmoid colon at the rectosigmoid junction, associated with marked wall thickening and marked luminal narrowing. No evidence of abscess or perforation. 2. Moderate to large stool burden in the colon, query functional obstruction due to the narrowed sigmoid colon lumen. 3. Descending and sigmoid colon diverticulosis. Aortic Atherosclerosis (ICD10-I70.0). Electronically Signed   By: Evangeline Dakin M.D.   On: 01/25/2019 21:00    Impression/Plan: -Acute rectosigmoid diverticulitis. -CT scan concerning for narrowing at rectosigmoid junction.  Patient had colonoscopy on December 02, 2018 which did not reveal any significant luminal narrowing.  Recommendations ------------------------- -Continue IV antibiotics today. -Advance diet to full liquid -MiraLAX twice a day for constipation.  Hold for diarrhea. -Hopefully discharge home in next 1 to 2 days on oral antibiotics. -GI will follow    LOS: 1 day   Otis Brace  MD, FACP 01/26/2019, 10:47 AM  Contact #  [phone]

## 2019-01-27 DIAGNOSIS — I10 Essential (primary) hypertension: Secondary | ICD-10-CM | POA: Diagnosis not present

## 2019-01-27 DIAGNOSIS — Z8719 Personal history of other diseases of the digestive system: Secondary | ICD-10-CM | POA: Diagnosis not present

## 2019-01-27 DIAGNOSIS — E860 Dehydration: Secondary | ICD-10-CM | POA: Diagnosis not present

## 2019-01-27 DIAGNOSIS — K5792 Diverticulitis of intestine, part unspecified, without perforation or abscess without bleeding: Secondary | ICD-10-CM | POA: Diagnosis not present

## 2019-01-27 LAB — BASIC METABOLIC PANEL
Anion gap: 10 (ref 5–15)
BUN: 7 mg/dL — ABNORMAL LOW (ref 8–23)
CO2: 21 mmol/L — ABNORMAL LOW (ref 22–32)
Calcium: 8.5 mg/dL — ABNORMAL LOW (ref 8.9–10.3)
Chloride: 102 mmol/L (ref 98–111)
Creatinine, Ser: 0.44 mg/dL (ref 0.44–1.00)
GFR calc Af Amer: >60 mL/min (ref >60)
GFR calc non Af Amer: >60 mL/min (ref >60)
Glucose, Bld: 85 mg/dL (ref 70–99)
Potassium: 3.6 mmol/L (ref 3.5–5.1)
Sodium: 133 mmol/L — ABNORMAL LOW (ref 135–145)

## 2019-01-27 LAB — MAGNESIUM: Magnesium: 1.9 mg/dL (ref 1.7–2.4)

## 2019-01-27 MED ORDER — POLYETHYLENE GLYCOL 3350 17 G PO PACK
17.0000 g | PACK | Freq: Every day | ORAL | Status: DC | PRN
  Administered 2019-01-27: 17 g via ORAL
  Filled 2019-01-27: qty 1

## 2019-01-27 NOTE — Progress Notes (Signed)
[facility] Gastroenterology Progress Note  Ellen Thomas 83 y.o. 12-05-1935  CC:   Diverticulitis   Subjective: Feeling better.  Abdominal pain has resolved but continues to have lower abdominal discomfort.  Complaining of loose stools.  Complaining of nausea.  Denies any vomiting  ROS : Afebrile.  Negative for chest pain.   Objective: Vital signs in last 24 hours: Vitals:   01/26/19 2027 01/27/19 0537  BP: (!) 162/69 (!) 159/63  Pulse: 74 77  Resp: 17 16  Temp: 98.7 F (37.1 C) 98.4 F (36.9 C)  SpO2: 100% 99%    Physical Exam:  General:  Alert, cooperative, no distress, appears stated age  Head:  Normocephalic, without obvious abnormality, atraumatic  Eyes:  , EOM's intact,   Lungs:   Clear to auscultation bilaterally, respirations unlabored  Heart:  Regular rate and rhythm, S1, S2 normal  Abdomen:   Soft, non-tender, bowel sounds active all four quadrants,  no masses,   Extremities: Extremities normal, atraumatic, no  edema  Pulses: 2+ and symmetric    Lab Results: Recent Labs    01/26/19 0546 01/27/19 0541  NA 129* 133*  K 3.4* 3.6  CL 97* 102  CO2 22 21*  GLUCOSE 108* 85  BUN 9 7*  CREATININE 0.48 0.44  CALCIUM 8.7* 8.5*  MG 1.9 1.9  PHOS 2.6  --    Recent Labs    01/25/19 1508 01/26/19 0546  AST 31 24  ALT 32 26  ALKPHOS 60 55  BILITOT 0.3 0.4  PROT 7.7 6.9  ALBUMIN 4.4 3.9   Recent Labs    01/25/19 1508 01/26/19 0546  WBC 9.8 8.5  HGB 13.8 12.7  HCT 41.6 38.7  MCV 89.5 89.2  PLT 241 211   No results for input(s): LABPROT, INR in the last 72 hours.    Assessment/Plan: -Acute rectosigmoid diverticulitis. -CT scan concerning for narrowing at rectosigmoid junction.  Patient had colonoscopy on December 02, 2018 which did not reveal any significant luminal narrowing.  Recommendations ------------------------- -Advance diet to soft. -Change MiraLAX to as needed -Hopefully discharge home tomorrow on oral antibiotics. -GI will  follow   Otis Brace MD, [place] 01/27/2019, 1:25 PM  Contact #  [phone]

## 2019-01-27 NOTE — Progress Notes (Signed)
PROGRESS NOTE    AMBROSIA USRY  [id number] DOB: [birth year] DOA: 01/25/2019 PCP: Bernerd Limbo, MD    Brief Narrative:  Ellen Thomas is a 83 y.o. female with medical history significant of hypertension, HLD, polymyalgia rheumatica, history of polio who presented with persistent abdominal pain and constipation.  Patient was recently seen by her GI physician Dr. Oletta Lamas and prescribed Bactrim for presumed diverticulitis with persistent nausea and no relief of her abdominal symptoms.  She recalled her GI office, who recommended evaluation in the ED. Denies fever no chills, no blood in stool.  Evaluation in the ED, notable for 98.2, HR 66, RR 18, BP 116/57, SPO2 100% on room air.  Sodium 125, potassium 4.3, chloride 92, CO2 22, glucose 114, BUN 17, creatinine 0.69.  AST 31, ALT 32, lipase 33, total bilirubin 0.3.  Urinalysis unrevealing.  CT abdomen/pelvis notable for acute diverticulitis distal sigmoid colon at the rectosigmoid junction associated with marked wall thickening and luminal narrowing with moderate to large stool burden throughout the colon. TRH consulted for admission given acute diverticulitis with failed outpatient antibiotics and significant constipation with concerns of functional obstruction secondary to narrowed sigmoid colon lumen.   Assessment & Plan:   Active Problems:   MGUS (monoclonal gammopathy of unknown significance)   Hypercholesterolemia   Benign hypertension   Polymyalgia rheumatica (HCC)   History of gastroesophageal reflux (GERD)   Dehydration   Diverticulitis   Hyponatremia   Acute diverticulitis with sigmoid luminal narrowing Patient presenting with persistent nausea, constipation and abdominal pain with failed outpatient antibiotics with Bactrim.  No bowel movement for 7 days.  CT abdomen/pelvis notable for acute diverticulitis distal sigmoid colon with marked wall thickening with associated luminal narrowing with concern for functional  obstruction with moderate to large stool burden; no abscess or perforation appreciated. --GI following, appreciate assistance --Continue IV antibiotics with ciprofloxacin 400 mg IV BID and Flagyl 500 mg IV q8hrs --Supportive care, antiemetics, antipyretics  Hyponatremia Sodium 125 on presentation.  Etiology likely hypovolemic hyponatremia given poor oral intake.  Urine sodium 41, urine osmolality 456 --Na 125-->129-->133 --Repeat BMP in a.m.  Constipation Large stool but none noted on CT abdomen/pelvis likely complicated by luminal narrowing of the sigmoid colon from underlying diverticulitis. --BM x 3 past 24h --De-escalate MiraLAX to prn daily --Closely monitor bowel movements  Adult failure to thrive Dehydration Patient with poor appetite and oral intake over the past week secondary to abdominal discomfort related to diverticulitis as above. --Diet transition to soft diet today by GI  Essential hypertension --Continue home ARB with hospital substitution, metoprolol tartrate 25 mg p.o. twice daily  Polymyalgia rheumatica Follows with rheumatology outpatient, Dr. Rennis Chris.  Continue outpatient follow-up.  HLD: Continue statin   DVT prophylaxis: SCDs, ambulation Code Status: Full code Family Communication: None Disposition Plan: Continue inpatient, IV antibiotics, hopeful for discharge home in 1-2 days   Consultants:   [facility]; Dr. Harl Bowie  Procedures:   none  Antimicrobials:   Ciprofloxacin 10/26>>  Flagyl 10/26>>   Subjective: Patient seen and examined at bedside, resting comfortably.  Abdominal discomfort and nausea improved.  Reports 3 bowel movements past 24 hours.  GI advancing diet today.  No other complaints or concerns at this time.  Denies headache, no fever/chills/night sweats, no vomiting/diarrhea, no chest pain, no palpitations, no shortness of breath, no cough/congestion, no paresthesias.  No acute events overnight per nursing staff.   Objective: Vitals:   01/26/19 1414 01/26/19 2027 01/27/19 0537 01/27/19 1411  BP: Marland Kitchen)  116/57 (!) 162/69 (!) 159/63 138/67  Pulse: 66 74 77 69  Resp: 18 17 16 16   Temp: 98.2 F (36.8 C) 98.7 F (37.1 C) 98.4 F (36.9 C) 98 F (36.7 C)  TempSrc: Oral Oral Oral Oral  SpO2: 100% 100% 99% 99%  Weight:      Height:        Intake/Output Summary (Last 24 hours) at 01/27/2019 1603 Last data filed at 01/27/2019 1200 Gross per 24 hour  Intake 497.85 ml  Output -  Net 497.85 ml   Filed Weights   01/25/19 1447 01/26/19 0500  Weight: 43.1 kg 43.8 kg    Examination:  General exam: Appears calm and comfortable  Respiratory system: Clear to auscultation. Respiratory effort normal. Cardiovascular system: S1 & S2 heard, RRR. No JVD, murmurs, rubs, gallops or clicks. No pedal edema. Gastrointestinal system: Abdomen is nondistended, soft, nontender to palpation, no rebound/guarding/masses. No organomegaly. Normal bowel sounds heard. Central nervous system: Alert and oriented. No focal neurological deficits. Extremities: Symmetric 5 x 5 power. Skin: No rashes, lesions or ulcers Psychiatry: Judgement and insight appear normal. Mood & affect appropriate.     Data Reviewed: I have personally reviewed following labs and imaging studies  CBC: Recent Labs  Lab 01/25/19 1508 01/26/19 0546  WBC 9.8 8.5  HGB 13.8 12.7  HCT 41.6 38.7  MCV 89.5 89.2  PLT 241 [id number]   Basic Metabolic Panel: Recent Labs  Lab 01/25/19 1508 01/26/19 0546 01/27/19 0541  NA 125* 129* 133*  K 4.3 3.4* 3.6  CL 92* 97* 102  CO2 22 22 21*  GLUCOSE 114* 108* 85  BUN 17 9 7*  CREATININE 0.69 0.48 0.44  CALCIUM 9.5 8.7* 8.5*  MG  --  1.9 1.9  PHOS  --  2.6  --    GFR: Estimated Creatinine Clearance: 38.8 mL/min (by C-G formula based on SCr of 0.44 mg/dL). Liver Function Tests: Recent Labs  Lab 01/25/19 1508 01/26/19 0546  AST 31 24  ALT 32 26  ALKPHOS 60 55  BILITOT 0.3 0.4  PROT 7.7 6.9  ALBUMIN  4.4 3.9   Recent Labs  Lab 01/25/19 1508  LIPASE 33   No results for input(s): AMMONIA in the last 168 hours. Coagulation Profile: No results for input(s): INR, PROTIME in the last 168 hours. Cardiac Enzymes: No results for input(s): CKTOTAL, CKMB, CKMBINDEX, TROPONINI in the last 168 hours. BNP (last 3 results) No results for input(s): PROBNP in the last 8760 hours. HbA1C: No results for input(s): HGBA1C in the last 72 hours. CBG: No results for input(s): GLUCAP in the last 168 hours. Lipid Profile: No results for input(s): CHOL, HDL, LDLCALC, TRIG, CHOLHDL, LDLDIRECT in the last 72 hours. Thyroid Function Tests: Recent Labs    01/26/19 0546  TSH 3.719   Anemia Panel: No results for input(s): VITAMINB12, FOLATE, FERRITIN, TIBC, IRON, RETICCTPCT in the last 72 hours. Sepsis Labs: No results for input(s): PROCALCITON, LATICACIDVEN in the last 168 hours.  Recent Results (from the past 240 hour(s))  SARS CORONAVIRUS 2 (TAT 6-24 HRS) Nasopharyngeal Nasopharyngeal Swab     Status: None   Collection Time: 01/25/19 10:45 PM   Specimen: Nasopharyngeal Swab  Result Value Ref Range Status   SARS Coronavirus 2 NEGATIVE NEGATIVE Final    Comment: (NOTE) SARS-CoV-2 target nucleic acids are NOT DETECTED. The SARS-CoV-2 RNA is generally detectable in upper and lower respiratory specimens during the acute phase of infection. Negative results do not preclude SARS-CoV-2 infection, do  not rule out co-infections with other pathogens, and should not be used as the sole basis for treatment or other patient management decisions. Negative results must be combined with clinical observations, patient history, and epidemiological information. The expected result is Negative. Fact Sheet for Patients: SugarRoll.be Fact Sheet for Healthcare Providers: https://www.woods-mathews.com/ This test is not yet approved or cleared by the [place] FDA and  has  been authorized for detection and/or diagnosis of SARS-CoV-2 by FDA under an Emergency Use Authorization (EUA). This EUA will remain  in effect (meaning this test can be used) for the duration of the COVID-19 declaration under Section 56 4(b)(1) of the Act, 21 U.S.C. section 360bbb-3(b)(1), unless the authorization is terminated or revoked sooner. Performed at [facility], [place] [address], [place], [place] [address]          Radiology Studies: Dg Chest 2 View  Result Date: 01/25/2019 CLINICAL DATA:  Diverticulitis EXAM: CHEST - 2 VIEW COMPARISON:  07/16/2016 FINDINGS: Cardiac shadows within normal limits. Aortic calcifications are noted. Elevation of left hemidiaphragm is seen. Lungs are clear. Degenerative changes of the left shoulder joint noted progressed from the prior study. IMPRESSION: No acute abnormality noted. Electronically Signed   By: Inez Catalina M.D.   On: 01/25/2019 23:22   Ct Abdomen Pelvis W Contrast  Result Date: 01/25/2019 CLINICAL DATA:  83 year old who is currently on day 5 of treatment for diverticulitis, presenting with persistent generalized abdominal pain, nausea and constipation. EXAM: CT ABDOMEN AND PELVIS WITH CONTRAST TECHNIQUE: Multidetector CT imaging of the abdomen and pelvis was performed using the standard protocol following bolus administration of intravenous contrast. CONTRAST:  161mL OMNIPAQUE IOHEXOL 300 MG/ML IV. COMPARISON:  None. FINDINGS: Lower chest: Elevation of the LEFT hemidiaphragm. Visualized lung bases clear. Heart size normal. Mild LAD and RIGHT coronary atherosclerosis. Hepatobiliary: Benign approximate 1.3 cm cyst involving the anterior segment RIGHT lobe of liver. No significant focal hepatic parenchymal abnormality. Gallbladder normal in appearance without calcified gallstones. No biliary ductal dilation. Pancreas: Atrophic pancreatic head. Normal appearing pancreatic body and tail. No peripancreatic edema. Spleen: Normal in size  and appearance. Adrenals/Urinary Tract: Normal appearing adrenal glands. Benign cortical cysts involving the RIGHT kidney. No significant parenchymal abnormality involving either kidney. No hydronephrosis. No urinary tract calculi. Normal appearing urinary bladder. Stomach/Bowel: Stomach normal in appearance for the degree of distention. Normal-appearing small bowel. Descending and sigmoid colon diverticulosis. Diverticulitis involving the distal sigmoid colon at the rectosigmoid junction, associated with marked wall thickening and luminal narrowing. Liquid stool in the rectum. Moderate to large stool burden in the colon. Appendix not conspicuous, but no pericecal inflammation. Vascular/Lymphatic: Severe aortoiliofemoral atherosclerosis without evidence of aneurysm. Normal-appearing portal venous and systemic venous systems. No pathologic lymphadenopathy. Reproductive: Surgically absent uterus. No adnexal masses. Other: Pessary device in the low pelvis. Musculoskeletal: Thoracic dextroscoliosis and compensatory lumbar levoscoliosis. Mild compression fracture of the upper endplate of L2 which does not appear acute. IMPRESSION: 1. Acute diverticulitis involving the distal sigmoid colon at the rectosigmoid junction, associated with marked wall thickening and marked luminal narrowing. No evidence of abscess or perforation. 2. Moderate to large stool burden in the colon, query functional obstruction due to the narrowed sigmoid colon lumen. 3. Descending and sigmoid colon diverticulosis. Aortic Atherosclerosis (ICD10-I70.0). Electronically Signed   By: Evangeline Dakin M.D.   On: 01/25/2019 21:00        Scheduled Meds: . atorvastatin  20 mg Oral q1800  . irbesartan  150 mg Oral Daily  . metoprolol  tartrate  25 mg Oral BID   Continuous Infusions: . sodium chloride    . ciprofloxacin Stopped (01/27/19 0939)  . metronidazole 100 mL/hr at 01/27/19 1200     LOS: 2 days    Time spent: 32 minutes spent on  chart review, discussion with nursing staff, consultants, updating family and interview/physical exam; more than 50% of that time was spent in counseling and/or coordination of care.    Eric J [place] DO [facility] 01/27/2019, 4:03 PM

## 2019-01-28 DIAGNOSIS — K5792 Diverticulitis of intestine, part unspecified, without perforation or abscess without bleeding: Secondary | ICD-10-CM | POA: Diagnosis not present

## 2019-01-28 LAB — MAGNESIUM: Magnesium: 1.9 mg/dL (ref 1.7–2.4)

## 2019-01-28 LAB — BASIC METABOLIC PANEL
Anion gap: 6 (ref 5–15)
BUN: 6 mg/dL — ABNORMAL LOW (ref 8–23)
CO2: 21 mmol/L — ABNORMAL LOW (ref 22–32)
Calcium: 8.4 mg/dL — ABNORMAL LOW (ref 8.9–10.3)
Chloride: 105 mmol/L (ref 98–111)
Creatinine, Ser: 0.35 mg/dL — ABNORMAL LOW (ref 0.44–1.00)
GFR calc Af Amer: >60 mL/min (ref >60)
GFR calc non Af Amer: >60 mL/min (ref >60)
Glucose, Bld: 95 mg/dL (ref 70–99)
Potassium: 3.3 mmol/L — ABNORMAL LOW (ref 3.5–5.1)
Sodium: 132 mmol/L — ABNORMAL LOW (ref 135–145)

## 2019-01-28 MED ORDER — PANTOPRAZOLE SODIUM 40 MG PO TBEC: 40.0000 mg | DELAYED_RELEASE_TABLET | Freq: Every day | ORAL | 0 refills | Status: DC

## 2019-01-28 MED ORDER — POLYETHYLENE GLYCOL 3350 17 G PO PACK: 17.0000 g | PACK | Freq: Every day | ORAL | 0 refills | Status: AC | PRN

## 2019-01-28 MED ORDER — POTASSIUM CHLORIDE CRYS ER 20 MEQ PO TBCR
40.0000 meq | EXTENDED_RELEASE_TABLET | Freq: Once | ORAL | Status: AC
  Administered 2019-01-28: 40 meq via ORAL
  Filled 2019-01-28: qty 2

## 2019-01-28 MED ORDER — METRONIDAZOLE 500 MG PO TABS: 500.0000 mg | ORAL_TABLET | Freq: Three times a day (TID) | ORAL | 0 refills | Status: AC

## 2019-01-28 MED ORDER — CIPROFLOXACIN HCL 500 MG PO TABS: 500.0000 mg | ORAL_TABLET | Freq: Two times a day (BID) | ORAL | 0 refills | Status: AC

## 2019-01-28 NOTE — Progress Notes (Signed)
Pt discharged home in stable condition. Discharge instructions given. Scripts sent to pharmacy of choice. No immediate questions or concerns at this time. Pt opted to ambulate off of unit.  

## 2019-01-28 NOTE — Progress Notes (Signed)
[facility] Progress Note  LETTA NEIDICH 83 y.o. [birth year]  CC:   Diverticulitis   Subjective: Feeling much better today.  Abdominal pain has completely resolved.  Constipation has resolved.  Denies nausea or vomiting.  ROS : Afebrile.  Negative for chest pain.   Objective: Vital signs in last 24 hours: Vitals:   01/27/19 2120 01/28/19 0559  BP: (!) 175/75 (!) 181/98  Pulse: 76 75  Resp: 16 16  Temp: 98.7 F (37.1 C) 98.4 F (36.9 C)  SpO2: 99% 95%    Physical Exam:  General:  Alert, cooperative, no distress, appears stated age  Head:  Normocephalic, without obvious abnormality, atraumatic  Eyes:  , EOM's intact,   Lungs:   Clear to auscultation bilaterally, respirations unlabored  Heart:  Regular rate and rhythm, S1, S2 normal  Abdomen:   Soft, non-tender, bowel sounds active all four quadrants,  no masses,   Extremities: Extremities normal, atraumatic, no  edema  Pulses: 2+ and symmetric    Lab Results: Recent Labs    01/26/19 0546 01/27/19 0541 01/28/19 0512  NA 129* 133* 132*  K 3.4* 3.6 3.3*  CL 97* 102 105  CO2 22 21* 21*  GLUCOSE 108* 85 95  BUN 9 7* 6*  CREATININE 0.48 0.44 0.35*  CALCIUM 8.7* 8.5* 8.4*  MG 1.9 1.9 1.9  PHOS 2.6  --   --    Recent Labs    01/25/19 1508 01/26/19 0546  AST 31 24  ALT 32 26  ALKPHOS 60 55  BILITOT 0.3 0.4  PROT 7.7 6.9  ALBUMIN 4.4 3.9   Recent Labs    01/25/19 1508 01/26/19 0546  WBC 9.8 8.5  HGB 13.8 12.7  HCT 41.6 38.7  MCV 89.5 89.2  PLT 241 211   No results for input(s): LABPROT, INR in the last 72 hours.    Assessment/Plan: -Acute rectosigmoid diverticulitis. -CT scan concerning for narrowing at rectosigmoid junction.  Patient had colonoscopy on December 02, 2018 which did not reveal any significant luminal narrowing.  Recommendations ------------------------- -Okay to discharge from GI standpoint on oral antibiotics for 10 days. -Patient was advised to take MiraLAX as  needed. -Avoid NSAIDs. -Follow-up in GI clinic in 6 to 8 weeks after discharge.   Otis Brace MD, [place] 01/28/2019, 10:20 AM  Contact #  [phone]

## 2019-01-28 NOTE — Care Management Important Message (Signed)
Important Message  Patient Details IM Letter given to Marney Doctor RN to present to the Patient Name: Ellen Thomas MRN: [id number] Date of Birth: 1935-08-08   Medicare Important Message Given:  Yes     Kerin Salen 01/28/2019, 10:01 AM

## 2019-01-28 NOTE — Discharge Instructions (Signed)

## 2019-01-28 NOTE — Discharge Summary (Signed)
Physician Discharge Summary  Ellen Thomas [id number] DOB: [birth year] DOA: 01/25/2019  PCP: Bernerd Limbo, MD  Admit date: 01/25/2019 Discharge date: 01/28/2019  Admitted From: Home Disposition: Home  Recommendations for Outpatient Follow-up:  1. Follow up with PCP in 1-2 weeks 2. Please obtain BMP/CBC in one week 3. Follow-up with gastroenterology outpatient 4. Continue antibiotics with ciprofloxacin and Flagyl to complete a 10-day course per GI for acute diverticulitis 5. MiraLAX daily to BID as needed for constipation  Home Health: No Equipment/Devices: None  Discharge Condition: Stable CODE STATUS: Full code Diet recommendation: Heart Healthy   History of present illness:  Ellen Thomas a 83 y.o.femalewith medical history significant of hypertension, HLD, polymyalgia rheumatica, history of polio who presented with persistent abdominal pain and constipation.  Patient was recently seen by her GI physician Dr. Oletta Lamas and prescribed Bactrim for presumed diverticulitis with persistent nausea and no relief of her abdominal symptoms.  She recalled her GI office, who recommended evaluation in the ED. Denies fever no chills, no blood in stool.  Evaluation in the ED, notable for 98.2, HR 66, RR 18, BP 116/57, SPO2 100% on room air.  Sodium 125, potassium 4.3, chloride 92, CO2 22, glucose 114, BUN 17, creatinine 0.69.  AST 31, ALT 32, lipase 33, total bilirubin 0.3.  Urinalysis unrevealing.  CT abdomen/pelvis notable for acute diverticulitis distal sigmoid colon at the rectosigmoid junction associated with marked wall thickening and luminal narrowing with moderate to large stool burden throughout the colon. TRH consulted for admission given acute diverticulitis with failed outpatient antibiotics and significant constipation with concerns of functional obstruction secondary to narrowed sigmoid colon lumen.  Hospital course:  Acute diverticulitis with sigmoid luminal  narrowing Patient presenting with persistent nausea, constipation and abdominal pain with failed outpatient antibiotics with Bactrim.  No bowel movement for 7 days.  CT abdomen/pelvis notable for acute diverticulitis distal sigmoid colon with marked wall thickening with associated luminal narrowing with concern for functional obstruction with moderate to large stool burden; no abscess or perforation appreciated.  Gastroenterology was consulted and followed during hospital course.  She was started on antibiotics with IV ciprofloxacin and Flagyl.  She was given MiraLAX twice daily with resolution of her constipation.  Patient symptoms improved and she was afebrile without leukocytosis.  GI recommends 10-day course of antibiotics with ciprofloxacin and Flagyl.  Will need follow-up outpatient to gastroenterology over the next several months.  Hyponatremia Sodium 125 on presentation.  Etiology likely hypovolemic hyponatremia given poor oral intake.  Urine sodium 41, urine osmolality 456.  Patient was started on IV fluid for hydration with improvement of her sodium level to 132 at time of discharge.  Recommend repeat BMP and next PCP visit.  Constipation Large stool but none noted on CT abdomen/pelvis likely complicated by luminal narrowing of the sigmoid colon from underlying diverticulitis.  Patient was started on MiraLAX twice daily with improvement of her constipation with multiple bowel movements.  Recommend continue MiraLAX daily to twice daily as needed to ensure daily bowel movements.  Adult failure to thrive Dehydration Patient with poor appetite and oral intake over the past week secondary to abdominal discomfort related to diverticulitis as above.  Patient was supported with IV fluids and IV antibiotics as above.  Patient's diet was slowly advanced with good toleration.  Recommend continue to encourage increased oral intake with increased fluids outpatient.  Essential hypertension Continue  home medication regimen to include metoprolol succinate 25 mg p.o. daily and telmisartan 20 mg p.o. twice  daily.  Polymyalgia rheumatica Follows with rheumatology outpatient, Dr. Rennis Chris.  Continue outpatient follow-up.  HLD: Continue statin  Discharge Diagnoses:  Principal Problem:   Diverticulitis Active Problems:   MGUS (monoclonal gammopathy of unknown significance)   Hypercholesterolemia   Benign hypertension   Polymyalgia rheumatica (HCC)   History of gastroesophageal reflux (GERD)   Dehydration   Hyponatremia    Discharge Instructions  Discharge Instructions    Call MD for:  difficulty breathing, headache or visual disturbances   Complete by: As directed    Call MD for:  extreme fatigue   Complete by: As directed    Call MD for:  persistant dizziness or light-headedness   Complete by: As directed    Call MD for:  persistant nausea and vomiting   Complete by: As directed    Call MD for:  severe uncontrolled pain   Complete by: As directed    Call MD for:  temperature >100.4   Complete by: As directed    Diet - low sodium heart healthy   Complete by: As directed    Increase activity slowly   Complete by: As directed      Allergies as of 01/28/2019      Reactions   Other    Cats   Tetracyclines & Related    Mouth sores      Medication List    STOP taking these medications   omeprazole 20 MG tablet Commonly known as: PRILOSEC OTC Replaced by: pantoprazole 40 MG tablet   sulfamethoxazole-trimethoprim 800-160 MG tablet Commonly known as: BACTRIM DS     TAKE these medications   acetaminophen 500 MG tablet Commonly known as: TYLENOL Take 500 mg by mouth every 6 (six) hours as needed for moderate pain or headache.   atorvastatin 20 MG tablet Commonly known as: LIPITOR Take 20 mg by mouth daily.   Biotin 1000 MCG tablet Take 1,000 mcg by mouth daily.   calcium carbonate 500 MG chewable tablet Commonly known as: TUMS - dosed in mg elemental  calcium Chew 1 tablet by mouth daily as needed for indigestion or heartburn.   cholecalciferol 1000 units tablet Commonly known as: VITAMIN D Take 1,000 Units by mouth daily.   ciprofloxacin 500 MG tablet Commonly known as: Cipro Take 1 tablet (500 mg total) by mouth 2 (two) times daily for 9 days.   denosumab 60 MG/ML Sosy injection Commonly known as: Prolia INJECT 60MG  SUBCUTANEOUSLY  EVERY 6 MONTHS (GIVEN AT  PRESCRIBERS OFFICE)   hydroxychloroquine 200 MG tablet Commonly known as: PLAQUENIL TAKE 1 TABLET BY MOUTH EVERY OTHER DAY What changed:   how much to take  how to take this  when to take this  additional instructions   metoprolol succinate 25 MG 24 hr tablet Commonly known as: TOPROL-XL Take 25 mg by mouth daily.   metroNIDAZOLE 500 MG tablet Commonly known as: Flagyl Take 1 tablet (500 mg total) by mouth 3 (three) times daily for 9 days.   multivitamin capsule Take 1 capsule by mouth as needed.   pantoprazole 40 MG tablet Commonly known as: PROTONIX Take 1 tablet (40 mg total) by mouth daily. Replaces: omeprazole 20 MG tablet   polyethylene glycol 17 g packet Commonly known as: MIRALAX / GLYCOLAX Take 17 g by mouth daily as needed for mild constipation.   Premarin vaginal cream Generic drug: conjugated estrogens Place 1 Applicatorful vaginally every 7 (seven) days.   SYSTANE OP Apply 1 drop to eye daily as needed (dry eyes).  telmisartan 20 MG tablet Commonly known as: MICARDIS Take 20 mg by mouth 2 (two) times daily.      Follow-up Information    Bernerd Limbo, MD. Schedule an appointment as soon as possible for a visit in 1 week(s).   Specialty: Family Medicine Contact information: [address] [place] [address] [phone]        Otis Brace, MD. Schedule an appointment as soon as possible for a visit in 1 month(s).   Specialty: Gastroenterology Contact information: [place] [address] [phone]          Allergies  Allergen Reactions  . Other     Cats  . Tetracyclines & Related     Mouth sores    Consultations:  [facility], Dr. Harl Bowie   Procedures/Studies: Dg Chest 2 View  Result Date: 01/25/2019 CLINICAL DATA:  Diverticulitis EXAM: CHEST - 2 VIEW COMPARISON:  07/16/2016 FINDINGS: Cardiac shadows within normal limits. Aortic calcifications are noted. Elevation of left hemidiaphragm is seen. Lungs are clear. Degenerative changes of the left shoulder joint noted progressed from the prior study. IMPRESSION: No acute abnormality noted. Electronically Signed   By: Inez Catalina M.D.   On: 01/25/2019 23:22   Ct Abdomen Pelvis W Contrast  Result Date: 01/25/2019 CLINICAL DATA:  83 year old who is currently on day 5 of treatment for diverticulitis, presenting with persistent generalized abdominal pain, nausea and constipation. EXAM: CT ABDOMEN AND PELVIS WITH CONTRAST TECHNIQUE: Multidetector CT imaging of the abdomen and pelvis was performed using the standard protocol following bolus administration of intravenous contrast. CONTRAST:  119mL OMNIPAQUE IOHEXOL 300 MG/ML IV. COMPARISON:  None. FINDINGS: Lower chest: Elevation of the LEFT hemidiaphragm. Visualized lung bases clear. Heart size normal. Mild LAD and RIGHT coronary atherosclerosis. Hepatobiliary: Benign approximate 1.3 cm cyst involving the anterior segment RIGHT lobe of liver. No significant focal hepatic parenchymal abnormality. Gallbladder normal in appearance without calcified gallstones. No biliary ductal dilation. Pancreas: Atrophic pancreatic head. Normal appearing pancreatic body and tail. No peripancreatic edema. Spleen: Normal in size and appearance. Adrenals/Urinary Tract: Normal appearing adrenal glands. Benign cortical cysts involving the RIGHT kidney. No significant parenchymal abnormality involving either kidney. No hydronephrosis. No urinary tract  calculi. Normal appearing urinary bladder. Stomach/Bowel: Stomach normal in appearance for the degree of distention. Normal-appearing small bowel. Descending and sigmoid colon diverticulosis. Diverticulitis involving the distal sigmoid colon at the rectosigmoid junction, associated with marked wall thickening and luminal narrowing. Liquid stool in the rectum. Moderate to large stool burden in the colon. Appendix not conspicuous, but no pericecal inflammation. Vascular/Lymphatic: Severe aortoiliofemoral atherosclerosis without evidence of aneurysm. Normal-appearing portal venous and systemic venous systems. No pathologic lymphadenopathy. Reproductive: Surgically absent uterus. No adnexal masses. Other: Pessary device in the low pelvis. Musculoskeletal: Thoracic dextroscoliosis and compensatory lumbar levoscoliosis. Mild compression fracture of the upper endplate of L2 which does not appear acute. IMPRESSION: 1. Acute diverticulitis involving the distal sigmoid colon at the rectosigmoid junction, associated with marked wall thickening and marked luminal narrowing. No evidence of abscess or perforation. 2. Moderate to large stool burden in the colon, query functional obstruction due to the narrowed sigmoid colon lumen. 3. Descending and sigmoid colon diverticulosis. Aortic Atherosclerosis (ICD10-I70.0). Electronically Signed   By: Evangeline Dakin M.D.   On: 01/25/2019 21:00      Subjective: Patient seen and examined at bedside, resting comfortably.  Hopeful for discharge home today.  Tolerating diet.  Reports several bowel  movements since admission with the use of MiraLAX.  GI present at bedside and discussed case; okay for discharge home on their standpoint with 10 days total of antibiotics.  Patient will need follow-up with GI, this was discussed with her.  No other complaints or concerns at this time.  Denies headache, no fever/chills/night sweats, no nausea/vomiting/diarrhea, no chest pain, no palpitations,  no shortness of breath, no abdominal pain, no weakness, no fatigue, no paresthesias.  No acute events overnight per nursing staff.   Discharge Exam: Vitals:   01/27/19 2120 01/28/19 0559  BP: (!) 175/75 (!) 181/98  Pulse: 76 75  Resp: 16 16  Temp: 98.7 F (37.1 C) 98.4 F (36.9 C)  SpO2: 99% 95%   Vitals:   01/27/19 0537 01/27/19 1411 01/27/19 2120 01/28/19 0559  BP: (!) 159/63 138/67 (!) 175/75 (!) 181/98  Pulse: 77 69 76 75  Resp: 16 16 16 16   Temp: 98.4 F (36.9 C) 98 F (36.7 C) 98.7 F (37.1 C) 98.4 F (36.9 C)  TempSrc: Oral Oral Oral Oral  SpO2: 99% 99% 99% 95%  Weight:      Height:        General: Pt is alert, awake, not in acute distress Cardiovascular: RRR, S1/S2 +, no rubs, no gallops Respiratory: CTA bilaterally, no wheezing, no rhonchi Abdominal: Soft, NT, ND, bowel sounds + Extremities: no edema, no cyanosis    The results of significant diagnostics from this hospitalization (including imaging, microbiology, ancillary and laboratory) are listed below for reference.     Microbiology: Recent Results (from the past 240 hour(s))  SARS CORONAVIRUS 2 (TAT 6-24 HRS) Nasopharyngeal Nasopharyngeal Swab     Status: None   Collection Time: 01/25/19 10:45 PM   Specimen: Nasopharyngeal Swab  Result Value Ref Range Status   SARS Coronavirus 2 NEGATIVE NEGATIVE Final    Comment: (NOTE) SARS-CoV-2 target nucleic acids are NOT DETECTED. The SARS-CoV-2 RNA is generally detectable in upper and lower respiratory specimens during the acute phase of infection. Negative results do not preclude SARS-CoV-2 infection, do not rule out co-infections with other pathogens, and should not be used as the sole basis for treatment or other patient management decisions. Negative results must be combined with clinical observations, patient history, and epidemiological information. The expected result is Negative. Fact Sheet for  Patients: SugarRoll.be Fact Sheet for Healthcare Providers: https://www.woods-mathews.com/ This test is not yet approved or cleared by the [place] FDA and  has been authorized for detection and/or diagnosis of SARS-CoV-2 by FDA under an Emergency Use Authorization (EUA). This EUA will remain  in effect (meaning this test can be used) for the duration of the COVID-19 declaration under Section 56 4(b)(1) of the Act, 21 U.S.C. section 360bbb-3(b)(1), unless the authorization is terminated or revoked sooner. Performed at [facility], [place] [address] [place], [place] [address]      Labs: BNP (last 3 results) No results for input(s): BNP in the last 8760 hours. Basic Metabolic Panel: Recent Labs  Lab 01/25/19 1508 01/26/19 0546 01/27/19 0541 01/28/19 0512  NA 125* 129* 133* 132*  K 4.3 3.4* 3.6 3.3*  CL 92* 97* 102 105  CO2 22 22 21* 21*  GLUCOSE 114* 108* 85 95  BUN 17 9 7* 6*  CREATININE 0.69 0.48 0.44 0.35*  CALCIUM 9.5 8.7* 8.5* 8.4*  MG  --  1.9 1.9 1.9  PHOS  --  2.6  --   --    Liver Function Tests: Recent Labs  Lab 01/25/19 1508 01/26/19 0546  AST 31 24  ALT 32 26  ALKPHOS 60 55  BILITOT 0.3 0.4  PROT 7.7 6.9  ALBUMIN 4.4 3.9   Recent Labs  Lab 01/25/19 1508  LIPASE 33   No results for input(s): AMMONIA in the last 168 hours. CBC: Recent Labs  Lab 01/25/19 1508 01/26/19 0546  WBC 9.8 8.5  HGB 13.8 12.7  HCT 41.6 38.7  MCV 89.5 89.2  PLT 241 211   Cardiac Enzymes: No results for input(s): CKTOTAL, CKMB, CKMBINDEX, TROPONINI in the last 168 hours. BNP: Invalid input(s): POCBNP CBG: No results for input(s): GLUCAP in the last 168 hours. D-Dimer No results for input(s): DDIMER in the last 72 hours. Hgb A1c No results for input(s): HGBA1C in the last 72 hours. Lipid Profile No results for input(s): CHOL, HDL, LDLCALC, TRIG, CHOLHDL, LDLDIRECT in the last 72 hours. Thyroid function  studies Recent Labs    01/26/19 0546  TSH 3.719   Anemia work up No results for input(s): VITAMINB12, FOLATE, FERRITIN, TIBC, IRON, RETICCTPCT in the last 72 hours. Urinalysis    Component Value Date/Time   COLORURINE YELLOW 01/25/2019 1852   APPEARANCEUR CLEAR 01/25/2019 1852   LABSPEC 1.015 01/25/2019 1852   PHURINE 6.0 01/25/2019 1852   GLUCOSEU NEGATIVE 01/25/2019 1852   HGBUR NEGATIVE 01/25/2019 1852   BILIRUBINUR NEGATIVE 01/25/2019 1852   KETONESUR 5 (A) 01/25/2019 1852   PROTEINUR NEGATIVE 01/25/2019 1852   NITRITE NEGATIVE 01/25/2019 1852   LEUKOCYTESUR NEGATIVE 01/25/2019 1852   Sepsis Labs Invalid input(s): PROCALCITONIN,  WBC,  LACTICIDVEN Microbiology Recent Results (from the past 240 hour(s))  SARS CORONAVIRUS 2 (TAT 6-24 HRS) Nasopharyngeal Nasopharyngeal Swab     Status: None   Collection Time: 01/25/19 10:45 PM   Specimen: Nasopharyngeal Swab  Result Value Ref Range Status   SARS Coronavirus 2 NEGATIVE NEGATIVE Final    Comment: (NOTE) SARS-CoV-2 target nucleic acids are NOT DETECTED. The SARS-CoV-2 RNA is generally detectable in upper and lower respiratory specimens during the acute phase of infection. Negative results do not preclude SARS-CoV-2 infection, do not rule out co-infections with other pathogens, and should not be used as the sole basis for treatment or other patient management decisions. Negative results must be combined with clinical observations, patient history, and epidemiological information. The expected result is Negative. Fact Sheet for Patients: SugarRoll.be Fact Sheet for Healthcare Providers: https://www.woods-mathews.com/ This test is not yet approved or cleared by the [place] FDA and  has been authorized for detection and/or diagnosis of SARS-CoV-2 by FDA under an Emergency Use Authorization (EUA). This EUA will remain  in effect (meaning this test can be used) for the duration of  the COVID-19 declaration under Section 56 4(b)(1) of the Act, 21 U.S.C. section 360bbb-3(b)(1), unless the authorization is terminated or revoked sooner. Performed at [facility], [place] [address], [place], [place] [address]      Time coordinating discharge: Over 30 minutes  SIGNED:   Adyson Vanburen J [place] DO [facility] 01/28/2019, 10:12 AM

## 2019-02-16 ENCOUNTER — Telehealth: Payer: Self-pay | Admitting: Rheumatology

## 2019-02-16 DIAGNOSIS — Z79899 Other long term (current) drug therapy: Secondary | ICD-10-CM

## 2019-02-16 NOTE — Telephone Encounter (Signed)
Patient left a voicemail stating she needs some advise about scheduling a medical and dental procedure and wanted to discuss it in relation to her next Prolia injection.  Patient stated she also wanted to tell Dr. Estanislado Pandy about an illness she had recently.

## 2019-02-17 NOTE — Telephone Encounter (Signed)
Attempted to contact the patient and left message for patient to call the office.  

## 2019-02-18 MED ORDER — DENOSUMAB 60 MG/ML ~~LOC~~ SOSY: 60.0000 mg | PREFILLED_SYRINGE | Freq: Once | SUBCUTANEOUS | 0 refills | Status: DC

## 2019-02-18 NOTE — Addendum Note (Signed)
Addended by: Carole Binning on: 02/18/2019 04:13 PM   Modules accepted: Orders

## 2019-02-18 NOTE — Telephone Encounter (Signed)
Patient returned call to the office. Patient states she has made a decision regarding the dental work she needs to have completed. Patient states she is going to go ahead and get her Prolia injection and postpone her extraction. Prescription for Prolia sent to pharmacy. Will contact patient once received in the office so she may have labs preformed.

## 2019-02-22 ENCOUNTER — Telehealth: Payer: Self-pay | Admitting: Rheumatology

## 2019-02-22 NOTE — Telephone Encounter (Signed)
Dorthea from [facility] called stating patient's Prolia will be delivered on Wednesday 02/24/19 before 12:00 pm.  If you have any questions, please call back at [phone]

## 2019-02-22 NOTE — Telephone Encounter (Signed)
Noted  

## 2019-03-15 ENCOUNTER — Telehealth: Payer: Self-pay | Admitting: Rheumatology

## 2019-03-15 NOTE — Telephone Encounter (Signed)
Patient called stating she is scheduled for a tooth extraction this week with Dr. Loyal Gambler.  Patient states he wants her to postpone her Prolia injection for a few weeks until she has healed.

## 2019-03-16 NOTE — Telephone Encounter (Signed)
Advised patient that Dr. Estanislado Pandy would like for her to follow the recommendations of Dr. Loyal Gambler. She can schedule the prolia injection when she is ready. Patient verbalized understanding.

## 2019-03-16 NOTE — Telephone Encounter (Signed)
I spoke with Dr. Estanislado Pandy and she would like the patient to follow the recommendations of Dr. Loyal Gambler. She can schedule the prolia injection when she is ready.

## 2019-04-07 ENCOUNTER — Telehealth: Payer: Self-pay | Admitting: Rheumatology

## 2019-04-07 NOTE — Telephone Encounter (Signed)
Patient called stating she had to postpone her Prolia injection because she had dental work in December.  Patient is requesting a return call to discuss rescheduling the injection appointment and also to make sure the medication is still at the office.

## 2019-04-08 NOTE — Telephone Encounter (Signed)
Patient advised labs are needed prior to prolia injection, patient verbalized understanding. Patient states her dental surgery went great and she has healed without any issue. Patient will contact Dr. Loyal Gambler for recommendations on how long she should post-pone prolia. Patient will call back when she is ready for prolia and at that point, lab orders will be released for patient's preferred location.

## 2019-04-08 NOTE — Telephone Encounter (Signed)
Attempted to contact the patient and left message for patient to call the office. Patient will need to get labs prior to getting Prolia injection.

## 2019-04-13 ENCOUNTER — Other Ambulatory Visit: Payer: Self-pay | Admitting: Rheumatology

## 2019-04-13 DIAGNOSIS — M353 Polymyalgia rheumatica: Secondary | ICD-10-CM

## 2019-04-13 NOTE — Telephone Encounter (Signed)
Last Visit: 09/09/18 Next Visit: due December 2020. Message sent to the front to schedule patient  Labs: 01/26/19 Potassium 3.4 Sodium 129 Calcium 8.7  PLQ eye exam normal on 10/13/2018   Okay to refill per Dr. Estanislado Pandy

## 2019-04-13 NOTE — Telephone Encounter (Signed)
Please schedule patient for a follow up visit. Patient was due December 2020. Thanks!

## 2019-04-16 NOTE — Telephone Encounter (Signed)
Patient states once she "gets the okay" from Dr. Landry Corporal office she will get her labwork done and call the office to schedule her follow-up appointment with Dr. Estanislado Pandy.

## 2019-04-29 ENCOUNTER — Ambulatory Visit: Payer: Medicare Other

## 2019-04-29 ENCOUNTER — Telehealth: Payer: Self-pay | Admitting: Rheumatology

## 2019-04-29 DIAGNOSIS — Z79899 Other long term (current) drug therapy: Secondary | ICD-10-CM

## 2019-04-29 NOTE — Telephone Encounter (Signed)
Patient advised lab orders have been released. Patient will call to schedule an appointment to have her labs completed. Patient advised we have her Prolia here in the office. Patient advised once we receive the lab results we will call to schedule injection appointment.

## 2019-04-29 NOTE — Telephone Encounter (Signed)
#  1.Patient's dentist has cleared her from her past tooth extraction to proceed with Prolia injection.  #2.Patient going to [place] on [address] this week for labs. Please release orders.

## 2019-05-07 ENCOUNTER — Ambulatory Visit: Payer: Medicare PPO | Attending: Internal Medicine

## 2019-05-07 ENCOUNTER — Other Ambulatory Visit: Payer: Self-pay

## 2019-05-07 DIAGNOSIS — Z23 Encounter for immunization: Secondary | ICD-10-CM

## 2019-05-07 DIAGNOSIS — Z79899 Other long term (current) drug therapy: Secondary | ICD-10-CM

## 2019-05-07 NOTE — Progress Notes (Signed)
   Covid-19 Vaccination Clinic  Name:  Ellen Thomas    MRN: [id number] DOB: February 08, 1936  05/07/2019  Ellen Thomas was observed post Covid-19 immunization for 15 minutes without incidence. She was provided with Vaccine Information Sheet and instruction to access the V-Safe system.   Ellen Thomas was instructed to call 911 with any severe reactions post vaccine: Marland Kitchen Difficulty breathing  . Swelling of your face and throat  . A fast heartbeat  . A bad rash all over your body  . Dizziness and weakness    Immunizations Administered    Name Date Dose VIS Date Route   Pfizer COVID-19 Vaccine 05/07/2019  9:02 AM 0.3 mL 03/12/2019 Intramuscular   Manufacturer: [place]   Lot: [id number]   [place]: [id number]

## 2019-05-08 LAB — CBC WITH DIFFERENTIAL/PLATELET
Absolute Monocytes: 360 cells/uL (ref 200–950)
Basophils Absolute: 32 cells/uL (ref 0–200)
Basophils Relative: 0.8 %
Eosinophils Absolute: 20 cells/uL (ref 15–500)
Eosinophils Relative: 0.5 %
HCT: 40.6 % (ref 35.0–45.0)
Hemoglobin: 13.2 g/dL (ref 11.7–15.5)
Lymphs Abs: 972 cells/uL (ref 850–3900)
MCH: 29.3 pg (ref 27.0–33.0)
MCHC: 32.5 g/dL (ref 32.0–36.0)
MCV: 90 fL (ref 80.0–100.0)
MPV: 8.7 fL (ref 7.5–12.5)
Monocytes Relative: 9 %
Neutro Abs: 2616 cells/uL (ref 1500–7800)
Neutrophils Relative %: 65.4 %
Platelets: 249 10*3/uL (ref 140–400)
RBC: 4.51 10*6/uL (ref 3.80–5.10)
RDW: 11.5 % (ref 11.0–15.0)
Total Lymphocyte: 24.3 %
WBC: 4 10*3/uL (ref 3.8–10.8)

## 2019-05-08 LAB — COMPLETE METABOLIC PANEL WITH GFR
AG Ratio: 1.6 (calc) (ref 1.0–2.5)
ALT: 13 U/L (ref 6–29)
AST: 21 U/L (ref 10–35)
Albumin: 4.2 g/dL (ref 3.6–5.1)
Alkaline phosphatase (APISO): 56 U/L (ref 37–153)
BUN/Creatinine Ratio: 21 (calc) (ref 6–22)
BUN: 10 mg/dL (ref 7–25)
CO2: 27 mmol/L (ref 20–32)
Calcium: 9.6 mg/dL (ref 8.6–10.4)
Chloride: 95 mmol/L — ABNORMAL LOW (ref 98–110)
Creat: 0.48 mg/dL — ABNORMAL LOW (ref 0.60–0.88)
GFR, Est African American: 107 mL/min/{1.73_m2} (ref >=60)
GFR, Est Non African American: 92 mL/min/{1.73_m2} (ref >=60)
Globulin: 2.7 g/dL (calc) (ref 1.9–3.7)
Glucose, Bld: 73 mg/dL (ref 65–99)
Potassium: 4.3 mmol/L (ref 3.5–5.3)
Sodium: 132 mmol/L — ABNORMAL LOW (ref 135–146)
Total Bilirubin: 0.5 mg/dL (ref 0.2–1.2)
Total Protein: 6.9 g/dL (ref 6.1–8.1)

## 2019-05-17 ENCOUNTER — Ambulatory Visit (INDEPENDENT_AMBULATORY_CARE_PROVIDER_SITE_OTHER): Payer: Medicare PPO

## 2019-05-17 ENCOUNTER — Other Ambulatory Visit: Payer: Self-pay

## 2019-05-17 VITALS — BP 129/55 | HR 65

## 2019-05-17 DIAGNOSIS — M810 Age-related osteoporosis without current pathological fracture: Secondary | ICD-10-CM

## 2019-05-17 MED ORDER — DENOSUMAB 60 MG/ML ~~LOC~~ SOSY
60.0000 mg | PREFILLED_SYRINGE | Freq: Once | SUBCUTANEOUS | Status: AC
  Administered 2019-05-17: 60 mg via SUBCUTANEOUS

## 2019-05-17 NOTE — Progress Notes (Signed)
Patient presents in the office today for prolia injection. Patient injected in the right upper arm and tolerated the injection well. Patient will return to the office in 10 days for labs.   Administrations This Visit    denosumab (PROLIA) injection 60 mg    Admin Date 05/17/2019 Action Given Dose 60 mg Route Subcutaneous Administered By Earnestine Mealing, CMA

## 2019-05-17 NOTE — Patient Instructions (Signed)
Standing Labs We placed an order today for your standing lab work.    Please come back and get your standing labs in 10 days. We have open lab daily Monday through Thursday from 8:30-12:30 PM and 1:30-4:30 PM and Friday from 8:30-12:30 PM and 1:30-4:00 PM at the office of Dr. Bo Merino.   You may experience shorter wait times on Monday and Friday afternoons. The office is located at [address] [place], [place], [place] [address] No appointment is necessary.   Labs are drawn by [facility].  You may receive a bill from [facility] for your lab work.  If you wish to have your labs drawn at another location, please call the office 24 hours in advance to send orders.  If you have any questions regarding directions or hours of operation,  please call [phone].   Just as a reminder please drink plenty of water prior to coming for your lab work. Thanks!

## 2019-05-28 ENCOUNTER — Other Ambulatory Visit: Payer: Self-pay | Admitting: *Deleted

## 2019-05-28 DIAGNOSIS — Z79899 Other long term (current) drug therapy: Secondary | ICD-10-CM

## 2019-05-29 LAB — CBC WITH DIFFERENTIAL/PLATELET
Absolute Monocytes: 360 cells/uL (ref 200–950)
Basophils Absolute: 22 cells/uL (ref 0–200)
Basophils Relative: 0.6 %
Eosinophils Absolute: 40 cells/uL (ref 15–500)
Eosinophils Relative: 1.1 %
HCT: 40.8 % (ref 35.0–45.0)
Hemoglobin: 13.5 g/dL (ref 11.7–15.5)
Lymphs Abs: 670 cells/uL — ABNORMAL LOW (ref 850–3900)
MCH: 30.4 pg (ref 27.0–33.0)
MCHC: 33.1 g/dL (ref 32.0–36.0)
MCV: 91.9 fL (ref 80.0–100.0)
MPV: 9.2 fL (ref 7.5–12.5)
Monocytes Relative: 10 %
Neutro Abs: 2509 cells/uL (ref 1500–7800)
Neutrophils Relative %: 69.7 %
Platelets: 186 10*3/uL (ref 140–400)
RBC: 4.44 10*6/uL (ref 3.80–5.10)
RDW: 11.9 % (ref 11.0–15.0)
Total Lymphocyte: 18.6 %
WBC: 3.6 10*3/uL — ABNORMAL LOW (ref 3.8–10.8)

## 2019-05-29 LAB — COMPLETE METABOLIC PANEL WITH GFR
AG Ratio: 1.6 (calc) (ref 1.0–2.5)
ALT: 12 U/L (ref 6–29)
AST: 20 U/L (ref 10–35)
Albumin: 4.1 g/dL (ref 3.6–5.1)
Alkaline phosphatase (APISO): 55 U/L (ref 37–153)
BUN/Creatinine Ratio: 33 (calc) — ABNORMAL HIGH (ref 6–22)
BUN: 16 mg/dL (ref 7–25)
CO2: 32 mmol/L (ref 20–32)
Calcium: 9.2 mg/dL (ref 8.6–10.4)
Chloride: 95 mmol/L — ABNORMAL LOW (ref 98–110)
Creat: 0.48 mg/dL — ABNORMAL LOW (ref 0.60–0.88)
GFR, Est African American: 107 mL/min/{1.73_m2} (ref >=60)
GFR, Est Non African American: 92 mL/min/{1.73_m2} (ref >=60)
Globulin: 2.5 g/dL (calc) (ref 1.9–3.7)
Glucose, Bld: 85 mg/dL (ref 65–99)
Potassium: 3.8 mmol/L (ref 3.5–5.3)
Sodium: 133 mmol/L — ABNORMAL LOW (ref 135–146)
Total Bilirubin: 0.6 mg/dL (ref 0.2–1.2)
Total Protein: 6.6 g/dL (ref 6.1–8.1)

## 2019-05-30 NOTE — Progress Notes (Signed)
Mild neutropenia noted.  Patient is on low-dose Plaquenil.  Sodium and potassium are low but is stable.  Patient has an appointment on June 01, 2019.  We will discuss labs at the follow-up visit.

## 2019-05-31 NOTE — Progress Notes (Signed)
Office Visit Note  Patient: Ellen Thomas             Date of Birth: [birth year]           MRN: [id number]             PCP: Bernerd Limbo, MD Referring: Bernerd Limbo, MD Visit Date: 06/01/2019 Occupation: @GUAROCC @  Subjective:  Discuss lab work   History of Present Illness: Ellen Thomas is a 84 y.o. female with history of polymyalgia rheumatica and osteoporosis.  She is taking plaquenil 200 mg 1 tablet by mouth every other day.  she has been off of prednisone for 1 year (March 2020). She received her second covid-19 vaccination today. She denies any signs or symptoms of a PMR flare.  She denies any increased muscle weakness or muscle tenderness.  She denies any joint swelling.  She states she presented to the ED on 01/25/19 and was diagnosed with acute diverticulitis. She was admitted for 3 days and received IV ciprofloxacin and Flagyl. She was discharged on oral cipro and flagyl.  She has not had any recurrence.    Activities of Daily Living:  Patient reports morning stiffness for 0 minutes.   Patient Denies nocturnal pain.  Difficulty dressing/grooming: Denies Difficulty climbing stairs: Denies Difficulty getting out of chair: Denies Difficulty using hands for taps, buttons, cutlery, and/or writing: Denies  Review of Systems  Constitutional: Negative for fatigue.  HENT: Negative for mouth sores, mouth dryness and nose dryness.   Eyes: Positive for dryness. Negative for pain and visual disturbance.  Respiratory: Negative for cough, hemoptysis, shortness of breath and difficulty breathing.   Cardiovascular: Negative for chest pain, palpitations, hypertension and swelling in legs/feet.  Gastrointestinal: Negative for blood in stool, constipation and diarrhea.  Endocrine: Negative for increased urination.  Genitourinary: Negative for painful urination.  Musculoskeletal: Positive for arthralgias and joint pain. Negative for joint swelling, myalgias, muscle weakness,  morning stiffness, muscle tenderness and myalgias.  Skin: Positive for color change. Negative for pallor, rash, hair loss, nodules/bumps, skin tightness, ulcers and sensitivity to sunlight.  Allergic/Immunologic: Negative for susceptible to infections.  Neurological: Negative for dizziness, numbness, headaches and weakness.  Hematological: Negative for swollen glands.  Psychiatric/Behavioral: Negative for depressed mood and sleep disturbance. The patient is not nervous/anxious.     PMFS History:  Patient Active Problem List   Diagnosis Date Noted  . Dehydration 01/25/2019  . Diverticulitis 01/25/2019  . Hyponatremia 01/25/2019  . History of humerus fracture 04/23/2016  . History of gastroesophageal reflux (GERD) 04/23/2016  . Positive ANA (antinuclear antibody) 04/23/2016  . History of poliomyelitis 04/23/2016  . Vitiligo 04/23/2016  . Scoliosis 04/23/2016  . Foreign body granuloma of skin and subcutaneous tissue 11/16/2015  . At risk for falling 03/24/2015  . Bronchitis 03/10/2014  . Encounter for general adult medical examination without abnormal findings 02/13/2014  . BMI (body mass index), pediatric, 5% to less than 85% for age 94/07/2013  . Hypercholesterolemia 04/05/2013  . Benign hypertension 04/05/2013  . Polymyalgia rheumatica ([place]) 04/05/2013  . Other specified health status 04/05/2013  . Disease of skin and subcutaneous tissue 03/11/2013  . OP (osteoporosis) 03/11/2013  . 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 Esophagitis, reflux 03/11/2013  . Neuralgia neuritis, sciatic nerve 03/11/2013  . Encounter for screening for eye and ear disorders 03/11/2013  . MGUS (monoclonal gammopathy of unknown significance) 11/01/2011    Past Medical History:  Diagnosis Date  . High cholesterol   . Hypertension   .  Kyphosis   . PMR (polymyalgia rheumatica) ([facility])   . Polio   . Scoliosis   . Vitiligo     Family History  Problem Relation Age of Onset  .  Diabetes Mother   . Cancer Brother    Past Surgical History:  Procedure Laterality Date  . ABDOMINAL HYSTERECTOMY  1993  . SHOULDER FUSION SURGERY Left    took a piece of hip and fused it into left shoulder  . TOTAL SHOULDER ARTHROPLASTY     Social History   Social History Narrative  . Not on file   Immunization History  Administered Date(s) Administered  . Influenza,inj,Quad PF,6+ Mos 12/15/2006, 02/13/2009, 02/14/2010, 02/18/2011, 02/10/2015, 02/14/2016, 01/31/2017, 02/03/2018  . PFIZER SARS-COV-2 Vaccination 05/07/2019, 06/01/2019  . Pneumococcal Conjugate-13 02/14/2012  . Pneumococcal Polysaccharide-23 08/12/2014     Objective: Vital Signs: BP (!) 153/63 (BP Location: Left Arm, Patient Position: Sitting, Cuff Size: Small)   Pulse 66   Resp 12   Ht 5\' 1"  (1.549 m)   Wt 96 lb 3.2 oz (43.6 kg)   BMI 18.18 kg/m    Physical Exam Vitals and nursing note reviewed.  Constitutional:      Appearance: She is well-developed.  HENT:     Head: Normocephalic and atraumatic.  Eyes:     Conjunctiva/sclera: Conjunctivae normal.  Pulmonary:     Effort: Pulmonary effort is normal.  Abdominal:     General: Bowel sounds are normal.     Palpations: Abdomen is soft.  Musculoskeletal:     Cervical back: Normal range of motion.  Lymphadenopathy:     Cervical: No cervical adenopathy.  Skin:    General: Skin is warm and dry.     Capillary Refill: Capillary refill takes less than 2 seconds.  Neurological:     Mental Status: She is alert and oriented to person, place, and time.  Psychiatric:        Behavior: Behavior normal.      Musculoskeletal Exam: C-spine limited ROM.  Shoulder joints have limited ROM.  Elbow joints good ROM with no discomfort.  Wrist joints good ROM with no tenderness or synovitis.  Ganglion cyst present on radial aspect of right wrist. DIP synovial thickening.  Hip joints, knee joints, ankle joints, MTPs, PIPs, and DIPs good ROM with no synovitis.  No warmth or  effusion of knee joints.  No tenderness or swelling of ankle joints.    CDAI Exam: CDAI Score: -- Patient Global: --; Provider Global: -- Swollen: --; Tender: -- Joint Exam 06/01/2019   No joint exam has been documented for this visit   There is currently no information documented on the homunculus. Go to the Rheumatology activity and complete the homunculus joint exam.  Investigation: No additional findings.  Imaging: No results found.  Recent Labs: Lab Results  Component Value Date   WBC 3.6 (L) 05/28/2019   HGB 13.5 05/28/2019   PLT 186 05/28/2019   NA 133 (L) 05/28/2019   K 3.8 05/28/2019   CL 95 (L) 05/28/2019   CO2 32 05/28/2019   GLUCOSE 85 05/28/2019   BUN 16 05/28/2019   CREATININE 0.48 (L) 05/28/2019   BILITOT 0.6 05/28/2019   ALKPHOS 55 01/26/2019   AST 20 05/28/2019   ALT 12 05/28/2019   PROT 6.6 05/28/2019   ALBUMIN 3.9 01/26/2019   CALCIUM 9.2 05/28/2019   GFRAA 107 05/28/2019    Speciality Comments: PLQ eye exam normal on 10/13/2018 Mild Macular degeneration No reason to d/c PLQ per [facility]  Opthamology Follow up in 1 year  Procedures:  No procedures performed Allergies: Other and Tetracyclines & related      Assessment / Plan:     Visit Diagnoses: Polymyalgia rheumatica ([place]): She has not had any signs or symptoms of a polymyalgia rheumatica flare.  She has no muscle tenderness or muscle weakness at this time.  She has no difficulty rising from a seated position or raising her arms above her head.  She was encouraged to remain active and work on Psychiatrist.  She discontinued prednisone in March 2020.  She has not developed any signs or symptoms of a flare since then.  She will continue taking plaquenil 200 mg 1 tablet by mouth every other day.  I discussed that her white blood cell count was mildly low at 3.6 on 05/28/2019.  She will return for lab work in 1 month and her if her white blood cell count continues to trend down we will discuss  reducing the dose of Plaquenil.  She was advised to notify us if she develops any signs or symptoms of a PMR flare.  She will follow-up in the office in 6 months.  High risk medication use -Plaquenil 200 mg 1 tablet by mouth every other day.  Plaquenil eye exam was normal on 10/13/2018. WBC count was 3.6 on 05/28/19.  Rest of lab work was stable. She will return for lab work in 1 month to recheck CBC.  Standing orders for CBC and CMP were placed today.  She received her second covid-19 vaccine today.    - Plan: CBC with Differential/Platelet, COMPLETE METABOLIC PANEL WITH GFR  Age-related osteoporosis without current pathological fracture - DEXA scan was on 04/23/2017 revealed a T score of -2.4.  She is due to update her bone density.  A future order was placed today.  She is planning on scheduling her DEXA in 1 month.  She continues to take vitamin D on a daily basis.  A future order for vitamin D level was placed today.- Plan: DG BONE DENSITY (DXA), VITAMIN D 25 Hydroxy (Vit-D Deficiency, Fractures)  Positive ANA (antinuclear antibody): She has intermittent symptoms of Raynaud's, which have been exacerbated by taking metoprolol. We discussed that beta blockers can exacerbate symptoms of Raynaud's.  We discussed the importance of wearing gloves/socks, keeping her core body temperature warm, and to drink warm liquids.   She has no other clinical features of autoimmune disease.   Primary osteoarthritis of both hands: She has DIP synovial thickening consistent with osteoarthritis of both hands.  No tenderness or synovitis was noted.  She has complete fist formation bilaterally.  Joint protection and muscle strengthening were discussed.  Primary osteoarthritis of both feet: She has no discomfort in her feet at this time.  She was proper fitting shoes  Scoliosis: She has occasional lower back pain but is asymptomatic at this time.  She has no symptoms of radiculopathy.  Other fatigue - She continues to have  chronic fatigue which has been stable.  She requested to have vitamin D and vitamin B12 checked with her lab work in 1 month.  Future orders were placed today.  Plan: VITAMIN D 25 Hydroxy (Vit-D Deficiency, Fractures), Vitamin B12  Other medical conditions are listed as follows:  Vitiligo  History of humerus fracture  History of poliomyelitis  History of hypertension  History of gastroesophageal reflux (GERD)  Dyslipidemia  MGUS (monoclonal gammopathy of unknown significance)  Orders: Orders Placed This Encounter  Procedures  . DG BONE  DENSITY (DXA)  . CBC with Differential/Platelet  . COMPLETE METABOLIC PANEL WITH GFR  . VITAMIN D 25 Hydroxy (Vit-D Deficiency, Fractures)  . Vitamin B12   No orders of the defined types were placed in this encounter.   Face-to-face time spent with patient was 30 minutes. Greater than 50% of time was spent in counseling and coordination of care.  Follow-Up Instructions: Return in about 6 months (around 12/02/2019) for Polymyalgia Rheumatica.   Ofilia Neas, PA-C   I examined and evaluated the patient with Hazel Sams PA.  Her PMR symptoms are well controlled.  She has got no increased weakness or tenderness on examination.  Although she has been experiencing some deconditioning.  She will be joining the gym in the near future.  She has mild neutropenia.  We will repeat WBC count in a month.  We may have to adjust the Plaquenil dose.  We discussed possibly reducing it to half a tablet if her WBC count persistently stays low.  The plan of care was discussed as noted above.  Bo Merino, MD Note - This record has been created using Editor, commissioning.  Chart creation errors have been sought, but may not always  have been located. Such creation errors do not reflect on  the standard of medical care.

## 2019-06-01 ENCOUNTER — Encounter: Payer: Self-pay | Admitting: Rheumatology

## 2019-06-01 ENCOUNTER — Ambulatory Visit: Payer: Medicare PPO | Attending: Internal Medicine

## 2019-06-01 ENCOUNTER — Ambulatory Visit: Payer: Medicare PPO | Admitting: Rheumatology

## 2019-06-01 ENCOUNTER — Other Ambulatory Visit: Payer: Self-pay

## 2019-06-01 VITALS — BP 153/63 | HR 66 | Resp 12 | Ht 61.0 in | Wt 96.2 lb

## 2019-06-01 DIAGNOSIS — Z8719 Personal history of other diseases of the digestive system: Secondary | ICD-10-CM

## 2019-06-01 DIAGNOSIS — M19042 Primary osteoarthritis, left hand: Secondary | ICD-10-CM

## 2019-06-01 DIAGNOSIS — R7689 Other specified abnormal immunological findings in serum: Secondary | ICD-10-CM

## 2019-06-01 DIAGNOSIS — M810 Age-related osteoporosis without current pathological fracture: Secondary | ICD-10-CM

## 2019-06-01 DIAGNOSIS — E785 Hyperlipidemia, unspecified: Secondary | ICD-10-CM

## 2019-06-01 DIAGNOSIS — D472 Monoclonal gammopathy: Secondary | ICD-10-CM

## 2019-06-01 DIAGNOSIS — R768 Other specified abnormal immunological findings in serum: Secondary | ICD-10-CM | POA: Diagnosis not present

## 2019-06-01 DIAGNOSIS — Z79899 Other long term (current) drug therapy: Secondary | ICD-10-CM | POA: Diagnosis not present

## 2019-06-01 DIAGNOSIS — Z8781 Personal history of (healed) traumatic fracture: Secondary | ICD-10-CM

## 2019-06-01 DIAGNOSIS — L80 Vitiligo: Secondary | ICD-10-CM

## 2019-06-01 DIAGNOSIS — Z8679 Personal history of other diseases of the circulatory system: Secondary | ICD-10-CM

## 2019-06-01 DIAGNOSIS — M19072 Primary osteoarthritis, left ankle and foot: Secondary | ICD-10-CM

## 2019-06-01 DIAGNOSIS — R5383 Other fatigue: Secondary | ICD-10-CM

## 2019-06-01 DIAGNOSIS — M353 Polymyalgia rheumatica: Secondary | ICD-10-CM

## 2019-06-01 DIAGNOSIS — M19041 Primary osteoarthritis, right hand: Secondary | ICD-10-CM

## 2019-06-01 DIAGNOSIS — Z23 Encounter for immunization: Secondary | ICD-10-CM | POA: Insufficient documentation

## 2019-06-01 DIAGNOSIS — M19071 Primary osteoarthritis, right ankle and foot: Secondary | ICD-10-CM

## 2019-06-01 DIAGNOSIS — Z8612 Personal history of poliomyelitis: Secondary | ICD-10-CM

## 2019-06-01 DIAGNOSIS — M41125 Adolescent idiopathic scoliosis, thoracolumbar region: Secondary | ICD-10-CM

## 2019-06-01 NOTE — Progress Notes (Signed)
   Covid-19 Vaccination Clinic  Name:  Ellen Thomas    MRN: [id number] DOB: [birth year]  06/01/2019  Ellen Thomas was observed post Covid-19 immunization for 15 minutes without incident. She was provided with Vaccine Information Sheet and instruction to access the V-Safe system.   Ellen Thomas was instructed to call 911 with any severe reactions post vaccine: Mar[place] Kitchen Difficulty breathing  . Swelling of face and throat  . A fast heartbeat  . A bad rash all over body  . Dizziness and weakness   Immunizations Administered    Name Date Dose VIS Date Route   Pfizer COVID-19 Vaccine 06/01/2019  8:44 AM 0.3 mL 03/12/2019 Intramuscular   Manufacturer: [place]   Lot: [id number]   [place]: [id number]

## 2019-06-01 NOTE — Patient Instructions (Addendum)
Due for DEXA and mammogram in 1 month (can schedule on the same day)  Return for lab work in our office in 1 month (repeat CBC)  Standing Labs We placed an order today for your standing lab work.    Please come back and get your standing labs in 1 month   We have open lab daily Monday through Thursday from 8:30-12:30 PM and 1:30-4:30 PM and Friday from 8:30-12:30 PM and 1:30-4:00 PM at the office of Dr. Bo Merino.   You may experience shorter wait times on Monday and Friday afternoons. The office is located at [address] [place], [place], [place] [address] No appointment is necessary.   Labs are drawn by [facility].  You may receive a bill from [facility] for your lab work.  If you wish to have your labs drawn at another location, please call the office 24 hours in advance to send orders.  If you have any questions regarding directions or hours of operation,  please call [phone].   Just as a reminder please drink plenty of water prior to coming for your lab work. Thanks!

## 2019-06-24 ENCOUNTER — Other Ambulatory Visit: Payer: Self-pay | Admitting: Rheumatology

## 2019-07-12 ENCOUNTER — Other Ambulatory Visit: Payer: Self-pay | Admitting: Rheumatology

## 2019-07-12 DIAGNOSIS — M353 Polymyalgia rheumatica: Secondary | ICD-10-CM

## 2019-07-12 NOTE — Telephone Encounter (Signed)
Last Visit: 06/01/19 Next Visit: due September 2021. Message sent to the front to schedule patient  Labs: 05/28/19 Mild neutropenia noted Sodium and potassium are low but is stable Eye exam:  normal on 10/13/2018 Mild Macular degeneration No reason to d/c PLQ per [facility]    Current Dose per office note on 06/01/19: Plaquenil 200 mg 1 tablet by mouth every other day.     Okay to refill per Dr. Estanislado Pandy

## 2019-07-13 ENCOUNTER — Encounter: Payer: Self-pay | Admitting: Rheumatology

## 2019-07-15 ENCOUNTER — Telehealth: Payer: Self-pay

## 2019-07-15 NOTE — Telephone Encounter (Signed)
Spoke with patient and advised of bone density results and recommendations, patient verbalized understanding.

## 2019-07-15 NOTE — Telephone Encounter (Addendum)
Bone Density  07/13/2019  Lowest site measured: left total hip T-score: -2.2 BMD: 0.680  DEXA reviewed by Hazel Sams, PA-C and patient should continue prolia as prescribed. Will send document to scan center.   Attempted to contact patient and left message on machine to advise patient to call the office.

## 2019-10-10 ENCOUNTER — Other Ambulatory Visit: Payer: Self-pay | Admitting: Rheumatology

## 2019-10-10 DIAGNOSIS — M353 Polymyalgia rheumatica: Secondary | ICD-10-CM

## 2019-10-11 ENCOUNTER — Telehealth: Payer: Self-pay | Admitting: Rheumatology

## 2019-10-11 NOTE — Telephone Encounter (Signed)
-----   Message from [place] sent at 10/11/2019  8:55 AM EDT ----- Please call to schedule follow up, patient is due 12/2019. Thanks!

## 2019-10-11 NOTE — Telephone Encounter (Addendum)
Last Visit: 06/01/2019 Next Visit: message sent to the front desk to schedule.  Labs: 05/28/2019 Mild neutropenia noted. Patient is on low-dose Plaquenil. Sodium and potassium are low but is stable Eye exam: 10/13/2018   Current Dose per office note on 06/01/2019: Plaquenil 200 mg 1 tablet by mouth every other day. She will return for lab work in 1 month and her if her white blood cell count continues to trend down we will discuss reducing the dose of Plaquenil. DX: Polymyalgia rheumatica  Attempted to contact patient and left message on machine to advise patient she is overdue to update labs.   Okay to refill 30 day supply of plaquenil?

## 2019-10-11 NOTE — Telephone Encounter (Signed)
LMOM for patient to call and schedule follow-up appointment with Dr. Deveshwar ° °

## 2019-11-03 ENCOUNTER — Telehealth: Payer: Self-pay | Admitting: Pharmacist

## 2019-11-03 NOTE — Telephone Encounter (Signed)
Submitted a Prior Authorization request to [facility] for [place] via Cover My Meds. Will update once we receive a response.   KeyMickel Duhamel - PA Case ID: [id number]

## 2019-11-03 NOTE — Telephone Encounter (Signed)
Patient will be due for Prolia 8/15.  Please verify benefits.  Mariella Saa, PharmD, [place], CPP Clinical Specialty Pharmacist (Rheumatology and Pulmonology)  11/03/2019 10:35 AM

## 2019-11-04 NOTE — Telephone Encounter (Signed)
[employer] denied Prolia through pharmacy benefit. Prolia was Approved through medical benefit through 03/31/20. Patient will need to receive injection at the hospital.  Part B drugs are covered at 80%.  PA Case ID: [id number]

## 2019-11-04 NOTE — Telephone Encounter (Signed)
Patient returned your call, requesting a call back.  

## 2019-11-04 NOTE — Telephone Encounter (Signed)
Left message notifying that Prolia injection is due and insurance change requires her to receive at hospital.  Also, will need labs prior to injection.  Requested return call.   Mariella Saa, PharmD, [place], CPP Clinical Specialty Pharmacist (Rheumatology and Pulmonology)  11/04/2019 10:00 AM

## 2019-11-08 ENCOUNTER — Other Ambulatory Visit: Payer: Self-pay

## 2019-11-08 DIAGNOSIS — M810 Age-related osteoporosis without current pathological fracture: Secondary | ICD-10-CM

## 2019-11-08 DIAGNOSIS — R5383 Other fatigue: Secondary | ICD-10-CM

## 2019-11-08 DIAGNOSIS — Z79899 Other long term (current) drug therapy: Secondary | ICD-10-CM

## 2019-11-08 LAB — COMPLETE METABOLIC PANEL WITH GFR
AG Ratio: 1.8 (calc) (ref 1.0–2.5)
ALT: 12 U/L (ref 6–29)
AST: 16 U/L (ref 10–35)
Albumin: 4.1 g/dL (ref 3.6–5.1)
Alkaline phosphatase (APISO): 60 U/L (ref 37–153)
BUN/Creatinine Ratio: 35 (calc) — ABNORMAL HIGH (ref 6–22)
BUN: 17 mg/dL (ref 7–25)
CO2: 28 mmol/L (ref 20–32)
Calcium: 9.1 mg/dL (ref 8.6–10.4)
Chloride: 93 mmol/L — ABNORMAL LOW (ref 98–110)
Creat: 0.48 mg/dL — ABNORMAL LOW (ref 0.60–0.88)
GFR, Est African American: 107 mL/min/{1.73_m2} (ref >=60)
GFR, Est Non African American: 92 mL/min/{1.73_m2} (ref >=60)
Globulin: 2.3 g/dL (calc) (ref 1.9–3.7)
Glucose, Bld: 68 mg/dL (ref 65–99)
Potassium: 4.5 mmol/L (ref 3.5–5.3)
Sodium: 130 mmol/L — ABNORMAL LOW (ref 135–146)
Total Bilirubin: 0.6 mg/dL (ref 0.2–1.2)
Total Protein: 6.4 g/dL (ref 6.1–8.1)

## 2019-11-08 LAB — CBC WITH DIFFERENTIAL/PLATELET
Absolute Monocytes: 688 cells/uL (ref 200–950)
Basophils Absolute: 19 cells/uL (ref 0–200)
Basophils Relative: 0.3 %
Eosinophils Absolute: 62 cells/uL (ref 15–500)
Eosinophils Relative: 1 %
HCT: 38.3 % (ref 35.0–45.0)
Hemoglobin: 12.9 g/dL (ref 11.7–15.5)
Lymphs Abs: 862 cells/uL (ref 850–3900)
MCH: 29.9 pg (ref 27.0–33.0)
MCHC: 33.7 g/dL (ref 32.0–36.0)
MCV: 88.9 fL (ref 80.0–100.0)
MPV: 9.2 fL (ref 7.5–12.5)
Monocytes Relative: 11.1 %
Neutro Abs: 4569 cells/uL (ref 1500–7800)
Neutrophils Relative %: 73.7 %
Platelets: 185 10*3/uL (ref 140–400)
RBC: 4.31 10*6/uL (ref 3.80–5.10)
RDW: 12.1 % (ref 11.0–15.0)
Total Lymphocyte: 13.9 %
WBC: 6.2 10*3/uL (ref 3.8–10.8)

## 2019-11-08 LAB — VITAMIN D 25 HYDROXY (VIT D DEFICIENCY, FRACTURES): Vit D, 25-Hydroxy: 20 ng/mL — ABNORMAL LOW (ref 30–100)

## 2019-11-08 LAB — VITAMIN B12: Vitamin B-12: 749 pg/mL (ref 200–1100)

## 2019-11-08 NOTE — Telephone Encounter (Signed)
Returned patient's call.  Notified that Prolia is now covered under the medical benefit and she will be receiving injection at the hospital.  Patient verbalized understanding.  She will come this afternoon for labs.  Once resulted in normal we will place orders and patient can call to schedule.  Patient verbalized understanding.   Mariella Saa, PharmD, [place], CPP Clinical Specialty Pharmacist (Rheumatology and Pulmonology)  11/08/2019 11:03 AM

## 2019-11-09 ENCOUNTER — Other Ambulatory Visit: Payer: Self-pay | Admitting: *Deleted

## 2019-11-09 DIAGNOSIS — E559 Vitamin D deficiency, unspecified: Secondary | ICD-10-CM

## 2019-11-09 MED ORDER — VITAMIN D (ERGOCALCIFEROL) 1.25 MG (50000 UNIT) PO CAPS: 50000.0000 [IU] | ORAL_CAPSULE | ORAL | 0 refills | Status: DC

## 2019-11-09 NOTE — Telephone Encounter (Signed)
-----   Message from Bo Merino, MD sent at 11/09/2019  8:17 AM EDT ----- Vitamin D is low.  Please call in vitamin D 50,000 units once a week for 3 months and repeat vitamin D level in 3 months.  CBC and vitamin D are normal.

## 2019-11-09 NOTE — Progress Notes (Signed)
Vitamin D is low.  Please call in vitamin D 50,000 units once a week for 3 months and repeat vitamin D level in 3 months.  CBC and vitamin D are normal.

## 2019-11-10 ENCOUNTER — Encounter: Payer: Self-pay | Admitting: Rheumatology

## 2019-11-10 ENCOUNTER — Other Ambulatory Visit: Payer: Self-pay | Admitting: Pharmacist

## 2019-11-10 DIAGNOSIS — M810 Age-related osteoporosis without current pathological fracture: Secondary | ICD-10-CM

## 2019-11-10 NOTE — Progress Notes (Signed)
Calcium WNL on 11/09/19 and can proceed with Prolia injection.  Orders placed for Prolia 60 mg.   Mariella Saa, PharmD, [place], CPP Clinical Specialty Pharmacist (Rheumatology and Pulmonology)  11/10/2019 9:18 AM

## 2019-11-15 ENCOUNTER — Telehealth: Payer: Self-pay | Admitting: Rheumatology

## 2019-11-15 ENCOUNTER — Inpatient Hospital Stay (HOSPITAL_COMMUNITY): Admission: RE | Admit: 2019-11-15 | Payer: Medicare PPO | Source: Ambulatory Visit

## 2019-11-15 NOTE — Telephone Encounter (Signed)
Patient called stating last week she had a flair-up of her diverticulitis and was prescribed an antibiotic.  Patient states she cancelled her Prolia injection scheduled for today.  Patient states she has improved, but still didn't feel well enough to have the injection and is also still taking antibiotics.  Patient states the appointment was rescheduled for next Monday, 11/22/19 which is "just outside the 10 day period for labwork."   Patient is also requesting a return call to discuss starting the Vitamin D treatment.  Patient states she had planned to start yesterday, but was unsure if she needed to wait until she was finished with the antibiotics.

## 2019-11-15 NOTE — Telephone Encounter (Signed)
Patient advised she can proceed with Prolia on the 11/22/19.  She can go ahead and start her Vitamin D prior to finishing her antibiotics.

## 2019-11-15 NOTE — Telephone Encounter (Signed)
She can proceed with Prolia on the 11/22/19.  She can go ahead and start her Vitamin D prior to finishing her antibiotics.

## 2019-11-22 ENCOUNTER — Ambulatory Visit (HOSPITAL_COMMUNITY)
Admission: RE | Admit: 2019-11-22 | Discharge: 2019-11-22 | Disposition: A | Discharge status: Home / Self Care | Payer: Medicare PPO | Source: Ambulatory Visit | Attending: Rheumatology | Admitting: Rheumatology

## 2019-11-22 ENCOUNTER — Other Ambulatory Visit: Payer: Self-pay

## 2019-11-22 DIAGNOSIS — M810 Age-related osteoporosis without current pathological fracture: Secondary | ICD-10-CM

## 2019-11-22 MED ORDER — DENOSUMAB 60 MG/ML ~~LOC~~ SOSY
PREFILLED_SYRINGE | SUBCUTANEOUS | Status: AC
  Administered 2019-11-22: 60 mg
  Filled 2019-11-22: qty 1

## 2019-11-22 MED ORDER — DENOSUMAB 60 MG/ML ~~LOC~~ SOSY: 60.0000 mg | PREFILLED_SYRINGE | Freq: Once | SUBCUTANEOUS | Status: DC

## 2019-11-26 NOTE — Progress Notes (Signed)
Office Visit Note  Patient: Ellen Thomas             Date of Birth: 12/11/1935           MRN: [id number]             PCP: Bernerd Limbo, MD Referring: Bernerd Limbo, MD Visit Date: 12/10/2019 Occupation: @GUAROCC @  Subjective:  Medication monitoring.   History of Present Illness: Ellen Thomas is a 84 y.o. female with history of polymyalgia, osteoarthritis and osteoporosis.  She has been off prednisone for a long time.  She has no recurrence of muscle weakness or tenderness.  She has been taking Prolia injections every 6 months which she has been tolerating well.  She has some stiffness in her right trapezius area which she relates to sleeping awkward.  Activities of Daily Living:  Patient reports morning stiffness for 0 minute.   Patient Denies nocturnal pain.  Difficulty dressing/grooming: Reports Difficulty climbing stairs: Reports Difficulty getting out of chair: Denies Difficulty using hands for taps, buttons, cutlery, and/or writing: Denies  Review of Systems  Constitutional: Negative for fatigue, night sweats, weight gain and weight loss.  HENT: Positive for mouth dryness. Negative for mouth sores, trouble swallowing, trouble swallowing and nose dryness.        With medications  Eyes: Negative for pain, redness, visual disturbance and dryness.  Respiratory: Negative for cough, shortness of breath and difficulty breathing.   Cardiovascular: Negative for chest pain, palpitations, hypertension, irregular heartbeat and swelling in legs/feet.  Gastrointestinal: Negative for blood in stool, constipation and diarrhea.  Endocrine: Negative for increased urination.  Genitourinary: Negative for vaginal dryness.  Musculoskeletal: Positive for arthralgias and joint pain. Negative for joint swelling, myalgias, muscle weakness, morning stiffness, muscle tenderness and myalgias.  Skin: Positive for color change. Negative for rash, hair loss, skin tightness, ulcers and  sensitivity to sunlight.  Allergic/Immunologic: Negative for susceptible to infections.  Neurological: Negative for dizziness, memory loss, night sweats and weakness.  Hematological: Negative for swollen glands.  Psychiatric/Behavioral: Negative for depressed mood and sleep disturbance. The patient is not nervous/anxious.     PMFS History:  Patient Active Problem List   Diagnosis Date Noted  . Dehydration 01/25/2019  . Diverticulitis 01/25/2019  . Hyponatremia 01/25/2019  . History of humerus fracture 04/23/2016  . History of gastroesophageal reflux (GERD) 04/23/2016  . Positive ANA (antinuclear antibody) 04/23/2016  . History of poliomyelitis 04/23/2016  . Vitiligo 04/23/2016  . Scoliosis 04/23/2016  . Foreign body granuloma of skin and subcutaneous tissue 11/16/2015  . At risk for falling 03/24/2015  . Bronchitis 03/10/2014  . Encounter for general adult medical examination without abnormal findings 02/13/2014  . BMI (body mass index), pediatric, 5% to less than 85% for age 54/07/2013  . Hypercholesterolemia 04/05/2013  . Benign hypertension 04/05/2013  . Polymyalgia rheumatica ([place]) 04/05/2013  . Other specified health status 04/05/2013  . Disease of skin and subcutaneous tissue 03/11/2013  . OP (osteoporosis) 03/11/2013  . 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 Esophagitis, reflux 03/11/2013  . Neuralgia neuritis, sciatic nerve 03/11/2013  . Encounter for screening for eye and ear disorders 03/11/2013  . MGUS (monoclonal gammopathy of unknown significance) 11/01/2011    Past Medical History:  Diagnosis Date  . High cholesterol   . Hypertension   . Kyphosis   . PMR (polymyalgia rheumatica) (HCC)   . Polio   . Scoliosis   . Vitiligo     Family History  Problem Relation Age  of Onset  . Diabetes Mother   . Cancer Brother    Past Surgical History:  Procedure Laterality Date  . ABDOMINAL HYSTERECTOMY  1993  . SHOULDER FUSION SURGERY Left     took a piece of hip and fused it into left shoulder  . TOTAL SHOULDER ARTHROPLASTY     Social History   Social History Narrative  . Not on file   Immunization History  Administered Date(s) Administered  . Influenza,inj,Quad PF,6+ Mos 12/15/2006, 02/13/2009, 02/14/2010, 02/18/2011, 02/10/2015, 02/14/2016, 01/31/2017, 02/03/2018  . PFIZER SARS-COV-2 Vaccination 05/07/2019, 06/01/2019  . Pneumococcal Conjugate-13 02/14/2012  . Pneumococcal Polysaccharide-23 08/12/2014     Objective: Vital Signs: BP (!) 167/68 (BP Location: Right Arm, Patient Position: Sitting, Cuff Size: Normal)   Pulse 68   Resp 13   Ht 5\' 1"  (1.549 m)   Wt 96 lb 12.8 oz (43.9 kg)   BMI 18.29 kg/m    Physical Exam Vitals and nursing note reviewed.  Constitutional:      Appearance: She is well-developed.  HENT:     Head: Normocephalic and atraumatic.  Eyes:     Conjunctiva/sclera: Conjunctivae normal.  Cardiovascular:     Rate and Rhythm: Normal rate and regular rhythm.     Heart sounds: Normal heart sounds.  Pulmonary:     Effort: Pulmonary effort is normal.     Breath sounds: Normal breath sounds.  Abdominal:     General: Bowel sounds are normal.     Palpations: Abdomen is soft.  Musculoskeletal:     Cervical back: Normal range of motion.  Lymphadenopathy:     Cervical: No cervical adenopathy.  Skin:    General: Skin is warm and dry.     Capillary Refill: Capillary refill takes less than 2 seconds.  Neurological:     Mental Status: She is alert and oriented to person, place, and time.  Psychiatric:        Behavior: Behavior normal.      Musculoskeletal Exam: She has limited range of motion of her cervical spine.  She has very limited range of motion of shoulder joints.  She has limitation range of motion of her left elbow joint.  She has DIP and PIP thickening in her hands bilaterally.  Hip joints and knee joints with good range of motion.  No synovitis was noted.  No increased muscular weakness  was noted.  CDAI Exam: CDAI Score: -- Patient Global: --; Provider Global: -- Swollen: --; Tender: -- Joint Exam 12/10/2019   No joint exam has been documented for this visit   There is currently no information documented on the homunculus. Go to the Rheumatology activity and complete the homunculus joint exam.  Investigation: No additional findings.  Imaging: No results found.  Recent Labs: Lab Results  Component Value Date   WBC 3.7 (L) 12/08/2019   HGB 12.9 12/08/2019   PLT 194 12/08/2019   NA 130 (L) 12/08/2019   K 4.2 12/08/2019   CL 94 (L) 12/08/2019   CO2 29 12/08/2019   GLUCOSE 100 (H) 12/08/2019   BUN 16 12/08/2019   CREATININE 0.65 12/08/2019   BILITOT 0.5 12/08/2019   ALKPHOS 55 01/26/2019   AST 22 12/08/2019   ALT 15 12/08/2019   PROT 6.2 12/08/2019   ALBUMIN 3.9 01/26/2019   CALCIUM 9.4 12/08/2019   GFRAA 97 12/08/2019    Speciality Comments: PLQ eye exam: 11/10/2019 WNL @ [facility] Follow up in 1 year  Procedures:  No procedures performed Allergies: Other  and Tetracyclines & related   Assessment / Plan:     Visit Diagnoses: Polymyalgia rheumatica (HCC)-she has no increased muscular weakness or tenderness.  Her PMR is in remission.  High risk medication use - Plaquenil 200 mg 1 tablet by mouth every other day.  Which she is tolerating well.  Labs have been stable.  She has been getting her eye examination on a regular basis.  Positive ANA (antinuclear antibody)-she has no clinical features of lupus.  Age-related osteoporosis without current pathological fracture - July 13, 2019 T score -2.2 left femur overall BMD improved.  She is on Prolia injections every 6 months.  Last Prolia injection was November 22, 2019.  Vitamin D deficiency-her vitamin D was 20 on November 08, 2019.  She is on vitamin D 50,000units/week.  She will get repeat vitamin D in 3 months.  After that she will go on maintenance vitamin D at 2000 units/day.  Other  fatigue-improved since he has been doing water aerobics.  Primary osteoarthritis of both hands-joint protection muscle strengthening was discussed.  Primary osteoarthritis of both feet-proper fitting shoes were discussed.  Scoliosis  Vitiligo  History of humerus fracture  History of poliomyelitis-she has limited range of motion of her cervical spine and shoulder joints and left-sided weakness.  History of hypertension-her systolic blood pressure was elevated today.  She states she will monitor it at home.  History of gastroesophageal reflux (GERD)  History of diverticulitis  Dyslipidemia  MGUS (monoclonal gammopathy of unknown significance)  Educated about COVID-19 virus infection-she is fully immunized against COVID-19.  Have advised her to get it booster as she is immunosuppressed.  Instructions were placed in the AVS.  She was also advised about getting monoclonal antibodies in case she gets COVID-19 infection.  Use of mask, social distancing and hand hygiene was emphasized.  Orders: No orders of the defined types were placed in this encounter.  No orders of the defined types were placed in this encounter.    Follow-Up Instructions: Return in about 5 months (around 05/11/2020) for Polymyalgia rheumatica, Osteoarthritis, Osteoporosis.   Bo Merino, MD  Note - This record has been created using Editor, commissioning.  Chart creation errors have been sought, but may not always  have been located. Such creation errors do not reflect on  the standard of medical care.

## 2019-12-08 ENCOUNTER — Other Ambulatory Visit: Payer: Self-pay

## 2019-12-08 DIAGNOSIS — Z79899 Other long term (current) drug therapy: Secondary | ICD-10-CM

## 2019-12-09 LAB — COMPLETE METABOLIC PANEL WITH GFR
AG Ratio: 2 (calc) (ref 1.0–2.5)
ALT: 15 U/L (ref 6–29)
AST: 22 U/L (ref 10–35)
Albumin: 4.1 g/dL (ref 3.6–5.1)
Alkaline phosphatase (APISO): 71 U/L (ref 37–153)
BUN: 16 mg/dL (ref 7–25)
CO2: 29 mmol/L (ref 20–32)
Calcium: 9.4 mg/dL (ref 8.6–10.4)
Chloride: 94 mmol/L — ABNORMAL LOW (ref 98–110)
Creat: 0.65 mg/dL (ref 0.60–0.88)
GFR, Est African American: 97 mL/min/{1.73_m2} (ref >=60)
GFR, Est Non African American: 83 mL/min/{1.73_m2} (ref >=60)
Globulin: 2.1 g/dL (calc) (ref 1.9–3.7)
Glucose, Bld: 100 mg/dL — ABNORMAL HIGH (ref 65–99)
Potassium: 4.2 mmol/L (ref 3.5–5.3)
Sodium: 130 mmol/L — ABNORMAL LOW (ref 135–146)
Total Bilirubin: 0.5 mg/dL (ref 0.2–1.2)
Total Protein: 6.2 g/dL (ref 6.1–8.1)

## 2019-12-09 LAB — CBC WITH DIFFERENTIAL/PLATELET
Absolute Monocytes: 377 cells/uL (ref 200–950)
Basophils Absolute: 30 cells/uL (ref 0–200)
Basophils Relative: 0.8 %
Eosinophils Absolute: 148 cells/uL (ref 15–500)
Eosinophils Relative: 4 %
HCT: 38.9 % (ref 35.0–45.0)
Hemoglobin: 12.9 g/dL (ref 11.7–15.5)
Lymphs Abs: 881 cells/uL (ref 850–3900)
MCH: 29.7 pg (ref 27.0–33.0)
MCHC: 33.2 g/dL (ref 32.0–36.0)
MCV: 89.4 fL (ref 80.0–100.0)
MPV: 9 fL (ref 7.5–12.5)
Monocytes Relative: 10.2 %
Neutro Abs: 2264 cells/uL (ref 1500–7800)
Neutrophils Relative %: 61.2 %
Platelets: 194 10*3/uL (ref 140–400)
RBC: 4.35 10*6/uL (ref 3.80–5.10)
RDW: 12.3 % (ref 11.0–15.0)
Total Lymphocyte: 23.8 %
WBC: 3.7 10*3/uL — ABNORMAL LOW (ref 3.8–10.8)

## 2019-12-09 NOTE — Progress Notes (Signed)
CMP stable. WBC count borderline low-3.7.  She is taking low dose PLQ-200 mg every other day.   We will continue to monitor lab work closely.

## 2019-12-10 ENCOUNTER — Other Ambulatory Visit: Payer: Self-pay

## 2019-12-10 ENCOUNTER — Encounter: Payer: Self-pay | Admitting: Rheumatology

## 2019-12-10 ENCOUNTER — Ambulatory Visit: Payer: Medicare PPO | Admitting: Rheumatology

## 2019-12-10 VITALS — BP 167/68 | HR 68 | Resp 13 | Ht 61.0 in | Wt 96.8 lb

## 2019-12-10 DIAGNOSIS — Z8612 Personal history of poliomyelitis: Secondary | ICD-10-CM

## 2019-12-10 DIAGNOSIS — D472 Monoclonal gammopathy: Secondary | ICD-10-CM

## 2019-12-10 DIAGNOSIS — Z8719 Personal history of other diseases of the digestive system: Secondary | ICD-10-CM

## 2019-12-10 DIAGNOSIS — Z8781 Personal history of (healed) traumatic fracture: Secondary | ICD-10-CM

## 2019-12-10 DIAGNOSIS — E559 Vitamin D deficiency, unspecified: Secondary | ICD-10-CM

## 2019-12-10 DIAGNOSIS — L80 Vitiligo: Secondary | ICD-10-CM

## 2019-12-10 DIAGNOSIS — M19041 Primary osteoarthritis, right hand: Secondary | ICD-10-CM

## 2019-12-10 DIAGNOSIS — Z79899 Other long term (current) drug therapy: Secondary | ICD-10-CM

## 2019-12-10 DIAGNOSIS — M19071 Primary osteoarthritis, right ankle and foot: Secondary | ICD-10-CM

## 2019-12-10 DIAGNOSIS — M19042 Primary osteoarthritis, left hand: Secondary | ICD-10-CM

## 2019-12-10 DIAGNOSIS — E785 Hyperlipidemia, unspecified: Secondary | ICD-10-CM

## 2019-12-10 DIAGNOSIS — M353 Polymyalgia rheumatica: Secondary | ICD-10-CM | POA: Diagnosis not present

## 2019-12-10 DIAGNOSIS — R5383 Other fatigue: Secondary | ICD-10-CM

## 2019-12-10 DIAGNOSIS — Z8679 Personal history of other diseases of the circulatory system: Secondary | ICD-10-CM

## 2019-12-10 DIAGNOSIS — R768 Other specified abnormal immunological findings in serum: Secondary | ICD-10-CM | POA: Diagnosis not present

## 2019-12-10 DIAGNOSIS — M41125 Adolescent idiopathic scoliosis, thoracolumbar region: Secondary | ICD-10-CM

## 2019-12-10 DIAGNOSIS — M19072 Primary osteoarthritis, left ankle and foot: Secondary | ICD-10-CM

## 2019-12-10 DIAGNOSIS — M810 Age-related osteoporosis without current pathological fracture: Secondary | ICD-10-CM | POA: Diagnosis not present

## 2019-12-10 DIAGNOSIS — R7689 Other specified abnormal immunological findings in serum: Secondary | ICD-10-CM

## 2019-12-10 NOTE — Patient Instructions (Signed)

## 2019-12-27 ENCOUNTER — Other Ambulatory Visit: Payer: Self-pay | Admitting: Rheumatology

## 2019-12-27 DIAGNOSIS — M353 Polymyalgia rheumatica: Secondary | ICD-10-CM

## 2019-12-27 NOTE — Telephone Encounter (Signed)
Last Visit: 12/10/2019 Next Visit: 05/12/2020 Labs: 12/08/2019 CMP stable. WBC count borderline low-3.7. Eye exam:  11/10/2019 WNL  Current Dose per office note 12/10/2019: Plaquenil 200 mg 1 tablet by mouth every other day DX: Polymyalgia rheumatica   Okay to refill Plaquenil?

## 2019-12-29 ENCOUNTER — Ambulatory Visit: Payer: Medicare PPO | Attending: Internal Medicine

## 2019-12-29 DIAGNOSIS — Z23 Encounter for immunization: Secondary | ICD-10-CM

## 2019-12-29 NOTE — Progress Notes (Signed)
   Covid-19 Vaccination Clinic  Name:  Ellen Thomas    MRN: [id number] DOB: [birth year]  12/29/2019  [name] was observed post Covid-19 immunization for 15 minutes without incident. She was provided with Vaccine Information Sheet and instruction to access the V-Safe system.   [name] was instructed to call 911 with any severe reactions post vaccine: Marland Kitchen Difficulty breathing  . Swelling of face and throat  . A fast heartbeat  . A bad rash all over body  . Dizziness and weakness

## 2019-12-31 ENCOUNTER — Other Ambulatory Visit (HOSPITAL_COMMUNITY): Payer: Self-pay | Admitting: Internal Medicine

## 2020-01-08 ENCOUNTER — Other Ambulatory Visit: Payer: Self-pay | Admitting: Rheumatology

## 2020-01-08 DIAGNOSIS — M353 Polymyalgia rheumatica: Secondary | ICD-10-CM

## 2020-04-28 NOTE — Progress Notes (Signed)
Office Visit Note  Patient: Ellen Thomas             Date of Birth: 27-Jun-1935           MRN: [id number]             PCP: Bernerd Limbo, MD Referring: Bernerd Limbo, MD Visit Date: 05/12/2020 Occupation: @GUAROCC @  Subjective:  Medication management   History of Present Illness: Ellen Thomas is a 85 y.o. female with history of polymyalgia, osteoarthritis and osteoporosis.  She denies any increased muscular pain or weakness.  She states she developed COVID-19 infection in the second week of January.  She had fever and chills for couple of days.  She had  cough for couple of weeks and then resolved.  She has not been going for exercises.  She will start doing water aerobics exercises soon.  She has been taking vitamin D for vitamin D deficiency.  Activities of Daily Living:  Patient reports morning stiffness for 0 none.   Patient Denies nocturnal pain.  Difficulty dressing/grooming: Denies Difficulty climbing stairs: Denies Difficulty getting out of chair: Denies Difficulty using hands for taps, buttons, cutlery, and/or writing: Reports  Review of Systems  Constitutional: Negative for fatigue.  HENT: Negative for mouth dryness.   Eyes: Positive for dryness.  Respiratory: Negative for shortness of breath.   Cardiovascular: Negative for swelling in legs/feet.  Gastrointestinal: Positive for constipation.  Endocrine: Positive for cold intolerance.  Genitourinary: Negative for difficulty urinating.  Musculoskeletal: Negative for arthralgias and joint pain.  Skin: Negative for rash.  Allergic/Immunologic: Negative for susceptible to infections.  Neurological: Negative for weakness.  Hematological: Negative for bruising/bleeding tendency.  Psychiatric/Behavioral: Negative for sleep disturbance.    PMFS History:  Patient Active Problem List   Diagnosis Date Noted  . Dehydration 01/25/2019  . Diverticulitis 01/25/2019  . Hyponatremia 01/25/2019  . History of  humerus fracture 04/23/2016  . History of gastroesophageal reflux (GERD) 04/23/2016  . Positive ANA (antinuclear antibody) 04/23/2016  . History of poliomyelitis 04/23/2016  . Vitiligo 04/23/2016  . Scoliosis 04/23/2016  . Foreign body granuloma of skin and subcutaneous tissue 11/16/2015  . At risk for falling 03/24/2015  . Bronchitis 03/10/2014  . Encounter for general adult medical examination without abnormal findings 02/13/2014  . BMI (body mass index), pediatric, 5% to less than 85% for age 29/07/2013  . Hypercholesterolemia 04/05/2013  . Benign hypertension 04/05/2013  . Polymyalgia rheumatica ([place]) 04/05/2013  . Other specified health status 04/05/2013  . Disease of skin and subcutaneous tissue 03/11/2013  . OP (osteoporosis) 03/11/2013  . 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 Esophagitis, reflux 03/11/2013  . Neuralgia neuritis, sciatic nerve 03/11/2013  . Encounter for screening for eye and ear disorders 03/11/2013  . MGUS (monoclonal gammopathy of unknown significance) 11/01/2011    Past Medical History:  Diagnosis Date  . High cholesterol   . Hypertension   . Kyphosis   . PMR (polymyalgia rheumatica) (HCC)   . Polio   . Scoliosis   . Vitiligo     Family History  Problem Relation Age of Onset  . Diabetes Mother   . Cancer Brother    Past Surgical History:  Procedure Laterality Date  . ABDOMINAL HYSTERECTOMY  1993  . SHOULDER FUSION SURGERY Left    took a piece of hip and fused it into left shoulder  . TOTAL SHOULDER ARTHROPLASTY     Social History   Social History Narrative  . Not on file  Immunization History  Administered Date(s) Administered  . Influenza,inj,Quad PF,6+ Mos 12/15/2006, 02/13/2009, 02/14/2010, 02/18/2011, 02/10/2015, 02/14/2016, 01/31/2017, 02/03/2018  . PFIZER(Purple Top)SARS-COV-2 Vaccination 05/07/2019, 06/01/2019, 12/29/2019  . Pneumococcal Conjugate-13 02/14/2012  . Pneumococcal Polysaccharide-23 08/12/2014      Objective: Vital Signs: BP (!) 149/67 (BP Location: Right Arm, Patient Position: Sitting, Cuff Size: Normal)   Pulse 74   Resp 16   Ht 5\' 1"  (1.549 m)   Wt 95 lb (43.1 kg)   BMI 17.95 kg/m    Physical Exam Vitals and nursing note reviewed.  Constitutional:      Appearance: She is well-developed and well-nourished.  HENT:     Head: Normocephalic and atraumatic.  Eyes:     Extraocular Movements: EOM normal.     Conjunctiva/sclera: Conjunctivae normal.  Cardiovascular:     Rate and Rhythm: Normal rate and regular rhythm.     Pulses: Intact distal pulses.     Heart sounds: Normal heart sounds.  Pulmonary:     Effort: Pulmonary effort is normal.     Breath sounds: Normal breath sounds.  Abdominal:     General: Bowel sounds are normal.     Palpations: Abdomen is soft.  Musculoskeletal:     Cervical back: Normal range of motion.  Lymphadenopathy:     Cervical: No cervical adenopathy.  Skin:    General: Skin is warm and dry.     Capillary Refill: Capillary refill takes less than 2 seconds.  Neurological:     Mental Status: She is alert and oriented to person, place, and time.  Psychiatric:        Mood and Affect: Mood and affect normal.        Behavior: Behavior normal.      Musculoskeletal Exam: She has limited range of motion of the cervical spine.  She has thoracolumbar scoliosis.  She has limited range of motion of bilateral shoulders.  She has good range of motion of her elbow joints.  DIP and PIP thickening was noted bilaterally with no synovitis.  Hip joints and knee joints with good range of motion.  No muscular weakness was noted.  CDAI Exam: CDAI Score: -- Patient Global: --; Provider Global: -- Swollen: --; Tender: -- Joint Exam 05/12/2020   No joint exam has been documented for this visit   There is currently no information documented on the homunculus. Go to the Rheumatology activity and complete the homunculus joint exam.  Investigation: No  additional findings.  Imaging: No results found.  Recent Labs: Lab Results  Component Value Date   WBC 3.7 (L) 12/08/2019   HGB 12.9 12/08/2019   PLT 194 12/08/2019   NA 130 (L) 12/08/2019   K 4.2 12/08/2019   CL 94 (L) 12/08/2019   CO2 29 12/08/2019   GLUCOSE 100 (H) 12/08/2019   BUN 16 12/08/2019   CREATININE 0.65 12/08/2019   BILITOT 0.5 12/08/2019   ALKPHOS 55 01/26/2019   AST 22 12/08/2019   ALT 15 12/08/2019   PROT 6.2 12/08/2019   ALBUMIN 3.9 01/26/2019   CALCIUM 9.4 12/08/2019   GFRAA 97 12/08/2019    Speciality Comments: PLQ eye exam: 11/10/2019 WNL @ [facility] Follow up in 1 year  Procedures:  No procedures performed Allergies: Other, Tetracycline hcl, and Tetracyclines & related   Assessment / Plan:     Visit Diagnoses: Polymyalgia rheumatica ([place]) - Her PMR is in remission.  Increased muscular weakness or tenderness was noted.  High risk medication use - Plaquenil 200  mg 1 tablet by mouth every other day. PLQ eye exam: 11/10/2019.  Her labs are stable.  Will obtain labs today.  Positive ANA (antinuclear antibody) - she has no clinical features of lupus.  Age-related osteoporosis without current pathological fracture - July 13, 2019 T score -2.2 left femur overall BMD improved.  She is on Prolia injections every 6 months.  Last Prolia injection was November 22, 2019.  We will apply for the next Prolia injection.  Vitamin D deficiency-she received 50,000 units of vitamin D every week for 3 months.  She has been taking vitamin D 2000 units daily.  We will check vitamin D level today.  Other fatigue-improved.  Primary osteoarthritis of both hands-joint protection muscle strengthening was discussed.  Primary osteoarthritis of both feet-currently not symptomatic.  Scoliosis-she has some chronic discomfort.  Vitiligo  History of humerus fracture  History of poliomyelitis  History of gastroesophageal reflux (GERD)  History of  diverticulitis  History of hypertension-blood pressure was mildly elevated today.  Have advised to monitor blood pressure closely.  Dyslipidemia  MGUS (monoclonal gammopathy of unknown significance)  She has been fully vaccinated against COVID-19 and also received her third dose.  She will be getting fourth dose soon.  Orders: Orders Placed This Encounter  Procedures  . CBC with Differential/Platelet  . COMPLETE METABOLIC PANEL WITH GFR  . VITAMIN D 25 Hydroxy (Vit-D Deficiency, Fractures)   No orders of the defined types were placed in this encounter.    Follow-Up Instructions: Return in about 6 months (around 11/09/2020) for Polymyalgia rheumatica, Osteoarthritis, Osteoporosis.   Bo Merino, MD  Note - This record has been created using Editor, commissioning.  Chart creation errors have been sought, but may not always  have been located. Such creation errors do not reflect on  the standard of medical care.

## 2020-05-12 ENCOUNTER — Encounter: Payer: Self-pay | Admitting: Rheumatology

## 2020-05-12 ENCOUNTER — Telehealth: Payer: Self-pay

## 2020-05-12 ENCOUNTER — Ambulatory Visit: Payer: Medicare PPO | Admitting: Rheumatology

## 2020-05-12 ENCOUNTER — Other Ambulatory Visit: Payer: Self-pay

## 2020-05-12 VITALS — BP 149/67 | HR 74 | Resp 16 | Ht 61.0 in | Wt 95.0 lb

## 2020-05-12 DIAGNOSIS — R5383 Other fatigue: Secondary | ICD-10-CM

## 2020-05-12 DIAGNOSIS — R768 Other specified abnormal immunological findings in serum: Secondary | ICD-10-CM

## 2020-05-12 DIAGNOSIS — Z8719 Personal history of other diseases of the digestive system: Secondary | ICD-10-CM

## 2020-05-12 DIAGNOSIS — Z79899 Other long term (current) drug therapy: Secondary | ICD-10-CM | POA: Diagnosis not present

## 2020-05-12 DIAGNOSIS — M353 Polymyalgia rheumatica: Secondary | ICD-10-CM

## 2020-05-12 DIAGNOSIS — M810 Age-related osteoporosis without current pathological fracture: Secondary | ICD-10-CM

## 2020-05-12 DIAGNOSIS — M19041 Primary osteoarthritis, right hand: Secondary | ICD-10-CM

## 2020-05-12 DIAGNOSIS — Z8781 Personal history of (healed) traumatic fracture: Secondary | ICD-10-CM

## 2020-05-12 DIAGNOSIS — E785 Hyperlipidemia, unspecified: Secondary | ICD-10-CM

## 2020-05-12 DIAGNOSIS — Z8679 Personal history of other diseases of the circulatory system: Secondary | ICD-10-CM

## 2020-05-12 DIAGNOSIS — M19071 Primary osteoarthritis, right ankle and foot: Secondary | ICD-10-CM

## 2020-05-12 DIAGNOSIS — M41125 Adolescent idiopathic scoliosis, thoracolumbar region: Secondary | ICD-10-CM

## 2020-05-12 DIAGNOSIS — M19072 Primary osteoarthritis, left ankle and foot: Secondary | ICD-10-CM

## 2020-05-12 DIAGNOSIS — D472 Monoclonal gammopathy: Secondary | ICD-10-CM

## 2020-05-12 DIAGNOSIS — M19042 Primary osteoarthritis, left hand: Secondary | ICD-10-CM

## 2020-05-12 DIAGNOSIS — L80 Vitiligo: Secondary | ICD-10-CM

## 2020-05-12 DIAGNOSIS — E559 Vitamin D deficiency, unspecified: Secondary | ICD-10-CM

## 2020-05-12 DIAGNOSIS — Z8612 Personal history of poliomyelitis: Secondary | ICD-10-CM

## 2020-05-12 LAB — COMPLETE METABOLIC PANEL WITH GFR
AG Ratio: 1.8 (calc) (ref 1.0–2.5)
ALT: 16 U/L (ref 6–29)
AST: 25 U/L (ref 10–35)
Albumin: 4.3 g/dL (ref 3.6–5.1)
Alkaline phosphatase (APISO): 58 U/L (ref 37–153)
BUN/Creatinine Ratio: 28 (calc) — ABNORMAL HIGH (ref 6–22)
BUN: 14 mg/dL (ref 7–25)
CO2: 29 mmol/L (ref 20–32)
Calcium: 9.3 mg/dL (ref 8.6–10.4)
Chloride: 96 mmol/L — ABNORMAL LOW (ref 98–110)
Creat: 0.5 mg/dL — ABNORMAL LOW (ref 0.60–0.88)
GFR, Est African American: 104 mL/min/{1.73_m2} (ref >=60)
GFR, Est Non African American: 90 mL/min/{1.73_m2} (ref >=60)
Globulin: 2.4 g/dL (calc) (ref 1.9–3.7)
Glucose, Bld: 116 mg/dL — ABNORMAL HIGH (ref 65–99)
Potassium: 4.1 mmol/L (ref 3.5–5.3)
Sodium: 133 mmol/L — ABNORMAL LOW (ref 135–146)
Total Bilirubin: 0.8 mg/dL (ref 0.2–1.2)
Total Protein: 6.7 g/dL (ref 6.1–8.1)

## 2020-05-12 LAB — CBC WITH DIFFERENTIAL/PLATELET
Absolute Monocytes: 402 cells/uL (ref 200–950)
Basophils Absolute: 8 cells/uL (ref 0–200)
Basophils Relative: 0.2 %
Eosinophils Absolute: 29 cells/uL (ref 15–500)
Eosinophils Relative: 0.7 %
HCT: 40.3 % (ref 35.0–45.0)
Hemoglobin: 13.5 g/dL (ref 11.7–15.5)
Lymphs Abs: 861 cells/uL (ref 850–3900)
MCH: 30.5 pg (ref 27.0–33.0)
MCHC: 33.5 g/dL (ref 32.0–36.0)
MCV: 91.2 fL (ref 80.0–100.0)
MPV: 8.7 fL (ref 7.5–12.5)
Monocytes Relative: 9.8 %
Neutro Abs: 2800 cells/uL (ref 1500–7800)
Neutrophils Relative %: 68.3 %
Platelets: 188 10*3/uL (ref 140–400)
RBC: 4.42 10*6/uL (ref 3.80–5.10)
RDW: 11.9 % (ref 11.0–15.0)
Total Lymphocyte: 21 %
WBC: 4.1 10*3/uL (ref 3.8–10.8)

## 2020-05-12 LAB — VITAMIN D 25 HYDROXY (VIT D DEFICIENCY, FRACTURES): Vit D, 25-Hydroxy: 36 ng/mL (ref 30–100)

## 2020-05-12 NOTE — Telephone Encounter (Signed)
Last prolia injection: 11/22/2019 received at [facility] ([place]).   Patient was in the office today and had labs drawn. Please place orders for prolia injection per Dr. Estanislado Pandy. Thanks!

## 2020-05-16 ENCOUNTER — Other Ambulatory Visit: Payer: Self-pay | Admitting: Pharmacist

## 2020-05-16 DIAGNOSIS — M810 Age-related osteoporosis without current pathological fracture: Secondary | ICD-10-CM

## 2020-05-16 NOTE — Progress Notes (Addendum)
Next infusion for Prolia due and due for updated orders.  Dose: 60mg  every 6 months  Last Clinic Visit: 05/12/20 Next Clinic Visit: not yet scheduled  Last infusion: 11/22/19 Labs: 05/12/20 - Vit D wnl - CBC wnl - Creatinine slightly low but GFR wnl  Orders placed for Prolia 60mg  x 1 doses.  Called patient and provided with phone number for [facility] Medical Day [phone])  Will route to scheduling team to patient scheduled in August 2022 as requested by Dr. Estanislado Pandy at last [place].  Knox Saliva, PharmD, MPH Clinical Pharmacist (Rheumatology and Pulmonology)  Addendum: Patient scheduled for Prolia on 05/29/20. Next will be due 11/26/20.

## 2020-05-17 NOTE — Telephone Encounter (Signed)
Placed Prolia orders for [facility] Day. Called patient and provided with phone number for [facility] Medical Day [phone]).   Last Prolia infusion: 11/22/19 Labs: 05/12/20 - Vit D wnl - CBC wnl - Creatinine slightly low but GFR wnl  Patient will also need f/u OV with Dr. Estanislado Pandy in 6 months. Routed to scheduling team.

## 2020-05-29 ENCOUNTER — Other Ambulatory Visit: Payer: Self-pay

## 2020-05-29 ENCOUNTER — Ambulatory Visit (HOSPITAL_COMMUNITY)
Admission: RE | Admit: 2020-05-29 | Discharge: 2020-05-29 | Disposition: A | Discharge status: Home / Self Care | Payer: Medicare PPO | Source: Ambulatory Visit | Attending: Rheumatology | Admitting: Rheumatology

## 2020-05-29 DIAGNOSIS — M810 Age-related osteoporosis without current pathological fracture: Secondary | ICD-10-CM | POA: Insufficient documentation

## 2020-05-29 MED ORDER — DENOSUMAB 60 MG/ML ~~LOC~~ SOSY
PREFILLED_SYRINGE | SUBCUTANEOUS | Status: AC
  Filled 2020-05-29: qty 1

## 2020-05-29 MED ORDER — DENOSUMAB 60 MG/ML ~~LOC~~ SOSY
60.0000 mg | PREFILLED_SYRINGE | Freq: Once | SUBCUTANEOUS | Status: AC
  Administered 2020-05-29: 60 mg via SUBCUTANEOUS

## 2020-06-12 ENCOUNTER — Telehealth: Payer: Self-pay

## 2020-06-12 NOTE — Telephone Encounter (Signed)
Patient left a voicemail stating she has a post Covid rash that has continued for several weeks.  Patient requested a return call to discuss what she needs to do next.

## 2020-06-12 NOTE — Telephone Encounter (Signed)
Please advise 

## 2020-06-13 NOTE — Telephone Encounter (Signed)
I called patient 

## 2020-06-13 NOTE — Telephone Encounter (Signed)
Please follow up with dermatologist.

## 2020-06-19 ENCOUNTER — Telehealth: Payer: Self-pay | Admitting: Rheumatology

## 2020-06-19 NOTE — Telephone Encounter (Signed)
#  1Patient thinks she may have missed lab draw after her last Prolia injection on Feb. 10 th. Does patient need lab draw now, or should she hold off this time?  #2 Per patient, it is time for her to get the 4th booster. Does patient need a note to get one, or can she  just go to facility to have that without documentation?  Please call to advise.

## 2020-06-19 NOTE — Telephone Encounter (Signed)
Advised patient she does not need labs post prolia since her calcium has been normal for the past year. I advised patient to continue taking calcium and vitamin D.  Advised patient she does not need documentation to receive the 4th booster. She will call [facility] to schedule the booster.

## 2020-06-20 ENCOUNTER — Other Ambulatory Visit: Payer: Self-pay | Admitting: *Deleted

## 2020-06-20 DIAGNOSIS — M353 Polymyalgia rheumatica: Secondary | ICD-10-CM

## 2020-06-20 MED ORDER — HYDROXYCHLOROQUINE SULFATE 200 MG PO TABS: 200.0000 mg | ORAL_TABLET | ORAL | 0 refills | Status: DC

## 2020-06-20 NOTE — Telephone Encounter (Signed)
RX faxed from [facility] Visit: 05/12/2020 Next Visit: 11/09/2020 Labs: 05/12/2020, CBC WNL. Glucose is 116. Creatinine remains slightly low. GFR WNL. Sodium and chloride remain low but are stable. Vitamin D WNL-36. Please advise the patient to continue taking a maintenance dose of vitamin D.  Eye exam: 11/10/2019  Current Dose per office note 05/12/2020, Plaquenil 200 mg 1 tablet by mouth every other day DX: Polymyalgia rheumatica   Last Fill: 01/08/2020  Okay to refill Plaquenil?

## 2020-09-25 ENCOUNTER — Other Ambulatory Visit: Payer: Self-pay | Admitting: Physician Assistant

## 2020-09-25 DIAGNOSIS — M353 Polymyalgia rheumatica: Secondary | ICD-10-CM

## 2020-09-25 NOTE — Telephone Encounter (Signed)
Last Visit: 05/12/2020 Next Visit: 11/09/2020 Labs: 05/12/2020, CBC WNL.  Glucose is 116.  Creatinine remains slightly low. GFR WNL.  Sodium and chloride remain low but are stable. Vitamin D WNL-36.  Please advise the patient to continue taking a maintenance dose of vitamin D.   Eye exam: 11/10/2019   Current Dose per office note 05/12/2020, Plaquenil 200 mg 1 tablet by mouth every other day DX: Polymyalgia rheumatica    Last Fill: 06/20/2020  Okay to refill Plaquenil?

## 2020-10-27 NOTE — Progress Notes (Signed)
Office Visit Note  Patient: Ellen Thomas             Date of Birth: [birth year]           MRN: [id number]             PCP: Bernerd Limbo, MD Referring: Bernerd Limbo, MD Visit Date: 11/09/2020 Occupation: '@GUAROCC'$ @  Subjective:  Recent viral infection   History of Present Illness: Ellen Thomas is a 85 y.o. female with history of polymyalgia rheumatica and osteoporosis.  She is taking plaquenil 200 mg 1 tablet by mouth every other day.  She continues to tolerate Plaquenil without any side effects.  She denies any signs or symptoms of a polymyalgia rheumatica flare recently.  She is not experiencing any muscle tenderness or muscle aches at this time.  She has noticed some increased muscular deconditioning since having a recent viral infection.  She states that in mid July she developed a cough and sore throat.  She tested negative for COVID-19 at her PCPs office on 10/17/20.  Her symptoms then progressed to sinusitis followed by conjunctivitis.  She has had significant fatigue since her symptoms initially started and has had to take naps during the day.  She recently finished a prednisone taper which improved her energy and symptoms overall. Patient will be due for her next Prolia injection this month.  She denies any falls or fractures.  She continues to take vitamin D 2000 units daily and gets calcium through her diet.  She plans on returning to her water aerobics classes next week for exercise.    Activities of Daily Living:  Patient reports morning stiffness for 0 minutes  Patient Denies nocturnal pain.  Difficulty dressing/grooming: Denies Difficulty climbing stairs: Denies Difficulty getting out of chair: Denies Difficulty using hands for taps, buttons, cutlery, and/or writing: Denies  Review of Systems  Constitutional:  Positive for fatigue.  HENT:  Negative for mouth sores, mouth dryness and nose dryness.   Eyes:  Positive for dryness (Systane). Negative for pain and  visual disturbance.  Respiratory:  Negative for cough, hemoptysis, shortness of breath and difficulty breathing.   Cardiovascular:  Negative for chest pain, palpitations, hypertension and swelling in legs/feet.  Gastrointestinal:  Positive for constipation. Negative for blood in stool and diarrhea.  Endocrine: Negative for increased urination.  Genitourinary:  Negative for painful urination.  Musculoskeletal:  Positive for muscle weakness (Muscular deconditioning). Negative for joint pain, joint pain, joint swelling, myalgias, morning stiffness, muscle tenderness and myalgias.  Skin:  Negative for color change, pallor, rash, hair loss, nodules/bumps, skin tightness, ulcers and sensitivity to sunlight.  Allergic/Immunologic: Negative for susceptible to infections.  Neurological:  Positive for headaches. Negative for dizziness, numbness and weakness.  Hematological:  Negative for swollen glands.  Psychiatric/Behavioral:  Negative for depressed mood and sleep disturbance. The patient is not nervous/anxious.    PMFS History:  Patient Active Problem List   Diagnosis Date Noted   Dehydration 01/25/2019   Diverticulitis 01/25/2019   Hyponatremia 01/25/2019   History of humerus fracture 04/23/2016   History of gastroesophageal reflux (GERD) 04/23/2016   Positive ANA (antinuclear antibody) 04/23/2016   History of poliomyelitis 04/23/2016   Vitiligo 04/23/2016   Scoliosis 04/23/2016   Foreign body granuloma of skin and subcutaneous tissue 11/16/2015   At risk for falling 03/24/2015   Bronchitis 03/10/2014   Encounter for general adult medical examination without abnormal findings 02/13/2014   BMI (body mass index), pediatric, 5% to less than 85% for  age 72/07/2013   Hypercholesterolemia 04/05/2013   Benign hypertension 04/05/2013   Polymyalgia rheumatica ([place]) 04/05/2013   Other specified health status 04/05/2013   Disease of skin and subcutaneous tissue 03/11/2013   OP (osteoporosis)  03/11/2013   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/2013   Esophagitis, reflux 03/11/2013   Neuralgia neuritis, sciatic nerve 03/11/2013   Encounter for screening for eye and ear disorders 03/11/2013   MGUS (monoclonal gammopathy of unknown significance) 11/01/2011    Past Medical History:  Diagnosis Date   High cholesterol    Hypertension    Kyphosis    PMR (polymyalgia rheumatica) ([place])    Polio    Scoliosis    Vitiligo     Family History  Problem Relation Age of Onset   Diabetes Mother    Cancer Brother    Past Surgical History:  Procedure Laterality Date   ABDOMINAL HYSTERECTOMY  1993   SHOULDER FUSION SURGERY Left    took a piece of hip and fused it into left shoulder   TOTAL SHOULDER ARTHROPLASTY     Social History   Social History Narrative   Not on file   Immunization History  Administered Date(s) Administered   Influenza,inj,Quad PF,6+ Mos 12/15/2006, 02/13/2009, 02/14/2010, 02/18/2011, 02/10/2015, 02/14/2016, 01/31/2017, 02/03/2018   PFIZER(Purple Top)SARS-COV-2 Vaccination 05/07/2019, 06/01/2019, 12/29/2019   Pneumococcal Conjugate-13 02/14/2012   Pneumococcal Polysaccharide-23 08/12/2014     Objective: Vital Signs: BP (!) 181/78 (BP Location: Right Arm, Patient Position: Sitting, Cuff Size: Normal)   Pulse 67   Ht '5\' 1"'$  (1.549 m)   Wt 94 lb (42.6 kg)   BMI 17.76 kg/m    Physical Exam Vitals and nursing note reviewed.  Constitutional:      Appearance: She is well-developed.  HENT:     Head: Normocephalic and atraumatic.  Eyes:     Conjunctiva/sclera: Conjunctivae normal.  Pulmonary:     Effort: Pulmonary effort is normal.  Abdominal:     Palpations: Abdomen is soft.  Musculoskeletal:     Cervical back: Normal range of motion.  Lymphadenopathy:     Cervical: No cervical adenopathy.  Skin:    General: Skin is warm and dry.     Capillary Refill: Capillary refill takes less than 2 seconds.  Neurological:     Mental Status: She  is alert and oriented to person, place, and time.  Psychiatric:        Behavior: Behavior normal.     Musculoskeletal Exam: C-spine has limited range of motion with lateral rotation and flexion.  Good extension of the C-spine noted.  Thoracolumbar scoliosis noted.  Limited range of motion of both wrist joints.  Elbow joints have good range of motion with no tenderness or inflammation.  Wrist joints have good range of motion with no tenderness or joint swelling.  PIP and DIP thickening consistent with osteoarthritis of both hands noted.  Hip joints have good range of motion with no discomfort.  Knee joints have good range of motion with no warmth or effusion.  Ankle joints have good range of motion with no tenderness or joint swelling.  CDAI Exam: CDAI Score: -- Patient Global: --; Provider Global: -- Swollen: --; Tender: -- Joint Exam 11/09/2020   No joint exam has been documented for this visit   There is currently no information documented on the homunculus. Go to the Rheumatology activity and complete the homunculus joint exam.  Investigation: No additional findings.  Imaging: No results found.  Recent Labs: Lab Results  Component  Value Date   WBC 4.1 05/12/2020   HGB 13.5 05/12/2020   PLT 188 05/12/2020   NA 133 (L) 05/12/2020   K 4.1 05/12/2020   CL 96 (L) 05/12/2020   CO2 29 05/12/2020   GLUCOSE 116 (H) 05/12/2020   BUN 14 05/12/2020   CREATININE 0.50 (L) 05/12/2020   BILITOT 0.8 05/12/2020   ALKPHOS 55 01/26/2019   AST 25 05/12/2020   ALT 16 05/12/2020   PROT 6.7 05/12/2020   ALBUMIN 3.9 01/26/2019   CALCIUM 9.3 05/12/2020   GFRAA 104 05/12/2020    Speciality Comments: PLQ eye exam: 11/10/2019 WNL @ [facility] Follow up in 1 year  Procedures:  No procedures performed Allergies: Other, Tetracycline hcl, and Tetracyclines & related       Assessment / Plan:     Visit Diagnoses: Polymyalgia rheumatica ([place]) - PMR is in remission.  She is not  exhibiting any signs or symptoms of a polymyalgia rheumatica flare.  She is not experiencing any myalgias or muscle tenderness at this time.  She has no difficulty raising her arms above her head arising from a seated position.  She has noticed some increased muscular deconditioning since her recent upper respiratory tract infection.  She has been unable to go to water aerobics while she has been sick but plans on returning next week.  She will remain on Plaquenil as prescribed.  She was advised to notify us if she develops signs or symptoms of a flare.  She will follow-up in the office in 5 months.  High risk medication use - Plaquenil 200 mg 1 tablet by mouth every other day. PLQ eye exam: 11/10/2019 WNL @ [facility] Follow up in 1 year.  CBC and CMP were drawn on 05/12/2020.  She is due to update lab work.  Orders for CBC and CMP were released.- Plan: CBC with Differential/Platelet, COMPLETE METABOLIC PANEL WITH GFR  Age-related osteoporosis without current pathological fracture - DEXA: July 13, 2019 T score -2.2 left femur overall BMD improved.  She has not had any falls or fractures recently.  She has been taking vitamin D 2000 units daily.  Her last prolia injection was on 05/29/20.  She is due for her next prolia on or after 11/26/20.  CBC, CMP, and vitamin D level will be checked today prior to scheduling Prolia injection.  Her next DEXA will be due April 2023.- Plan: VITAMIN D 25 Hydroxy (Vit-D Deficiency, Fractures)  Vitamin D deficiency - She is taking vitamin D 2000 units daily.  Vitamin D was 36 on 05/12/2020.  Vitamin D level will be checked today prior to scheduling the next Prolia injection.  plan: VITAMIN D 25 Hydroxy (Vit-D Deficiency, Fractures)  Positive ANA (antinuclear antibody): She has no clinical features of systemic lupus at this time.  Other fatigue: She has been experiencing increased fatigue secondary to recent upper viral tract infection.  Her energy level has  gradually started to improve.  She plans on returning to her normal exercise regimen and would like to go back to water aerobics next week.  Primary osteoarthritis of both hands: She has PIP and DIP thickening consistent with osteoarthritis of both hands.  No tenderness or inflammation was noted on examination today.  She was able to make a complete fist bilaterally.  We discussed the importance of joint protection and muscle strengthening.  Primary osteoarthritis of both feet: Discussed the importance of wearing proper fitting shoes.  Scoliosis: Thoracolumbar scoliosis noted.   Other medical conditions  are listed as follows:   Vitiligo  History of humerus fracture  History of poliomyelitis  History of gastroesophageal reflux (GERD)  History of diverticulitis  History of hypertension  Dyslipidemia  MGUS (monoclonal gammopathy of unknown significance)  Orders: Orders Placed This Encounter  Procedures   CBC with Differential/Platelet   COMPLETE METABOLIC PANEL WITH GFR   VITAMIN D 25 Hydroxy (Vit-D Deficiency, Fractures)    No orders of the defined types were placed in this encounter.     Follow-Up Instructions: Return in about 5 months (around 04/11/2021) for Polymyalgia Rheumatica, Osteoporosis.   Ofilia Neas, PA-C  Note - This record has been created using Dragon software.  Chart creation errors have been sought, but may not always  have been located. Such creation errors do not reflect on  the standard of medical care.

## 2020-11-09 ENCOUNTER — Other Ambulatory Visit: Payer: Self-pay

## 2020-11-09 ENCOUNTER — Ambulatory Visit: Payer: Medicare PPO | Admitting: Physician Assistant

## 2020-11-09 ENCOUNTER — Encounter: Payer: Self-pay | Admitting: Physician Assistant

## 2020-11-09 VITALS — BP 181/78 | HR 67 | Ht 61.0 in | Wt 94.0 lb

## 2020-11-09 DIAGNOSIS — M19071 Primary osteoarthritis, right ankle and foot: Secondary | ICD-10-CM

## 2020-11-09 DIAGNOSIS — Z8781 Personal history of (healed) traumatic fracture: Secondary | ICD-10-CM

## 2020-11-09 DIAGNOSIS — R5383 Other fatigue: Secondary | ICD-10-CM

## 2020-11-09 DIAGNOSIS — M19041 Primary osteoarthritis, right hand: Secondary | ICD-10-CM

## 2020-11-09 DIAGNOSIS — E559 Vitamin D deficiency, unspecified: Secondary | ICD-10-CM | POA: Diagnosis not present

## 2020-11-09 DIAGNOSIS — Z8719 Personal history of other diseases of the digestive system: Secondary | ICD-10-CM

## 2020-11-09 DIAGNOSIS — Z8612 Personal history of poliomyelitis: Secondary | ICD-10-CM

## 2020-11-09 DIAGNOSIS — Z79899 Other long term (current) drug therapy: Secondary | ICD-10-CM | POA: Diagnosis not present

## 2020-11-09 DIAGNOSIS — L80 Vitiligo: Secondary | ICD-10-CM

## 2020-11-09 DIAGNOSIS — D472 Monoclonal gammopathy: Secondary | ICD-10-CM

## 2020-11-09 DIAGNOSIS — M353 Polymyalgia rheumatica: Secondary | ICD-10-CM | POA: Diagnosis not present

## 2020-11-09 DIAGNOSIS — M19072 Primary osteoarthritis, left ankle and foot: Secondary | ICD-10-CM

## 2020-11-09 DIAGNOSIS — Z8679 Personal history of other diseases of the circulatory system: Secondary | ICD-10-CM

## 2020-11-09 DIAGNOSIS — M810 Age-related osteoporosis without current pathological fracture: Secondary | ICD-10-CM

## 2020-11-09 DIAGNOSIS — E785 Hyperlipidemia, unspecified: Secondary | ICD-10-CM

## 2020-11-09 DIAGNOSIS — R768 Other specified abnormal immunological findings in serum: Secondary | ICD-10-CM

## 2020-11-09 DIAGNOSIS — M19042 Primary osteoarthritis, left hand: Secondary | ICD-10-CM

## 2020-11-09 DIAGNOSIS — M41125 Adolescent idiopathic scoliosis, thoracolumbar region: Secondary | ICD-10-CM

## 2020-11-10 ENCOUNTER — Telehealth: Payer: Self-pay

## 2020-11-10 LAB — CBC WITH DIFFERENTIAL/PLATELET
Absolute Monocytes: 413 cells/uL (ref 200–950)
Basophils Absolute: 22 cells/uL (ref 0–200)
Basophils Relative: 0.5 %
Eosinophils Absolute: 52 cells/uL (ref 15–500)
Eosinophils Relative: 1.2 %
HCT: 42.7 % (ref 35.0–45.0)
Hemoglobin: 13.7 g/dL (ref 11.7–15.5)
Lymphs Abs: 826 cells/uL — ABNORMAL LOW (ref 850–3900)
MCH: 29.1 pg (ref 27.0–33.0)
MCHC: 32.1 g/dL (ref 32.0–36.0)
MCV: 90.7 fL (ref 80.0–100.0)
MPV: 8.4 fL (ref 7.5–12.5)
Monocytes Relative: 9.6 %
Neutro Abs: 2989 cells/uL (ref 1500–7800)
Neutrophils Relative %: 69.5 %
Platelets: 238 10*3/uL (ref 140–400)
RBC: 4.71 10*6/uL (ref 3.80–5.10)
RDW: 12.1 % (ref 11.0–15.0)
Total Lymphocyte: 19.2 %
WBC: 4.3 10*3/uL (ref 3.8–10.8)

## 2020-11-10 LAB — COMPLETE METABOLIC PANEL WITH GFR
AG Ratio: 1.9 (calc) (ref 1.0–2.5)
ALT: 16 U/L (ref 6–29)
AST: 21 U/L (ref 10–35)
Albumin: 4.3 g/dL (ref 3.6–5.1)
Alkaline phosphatase (APISO): 49 U/L (ref 37–153)
BUN/Creatinine Ratio: 31 (calc) — ABNORMAL HIGH (ref 6–22)
BUN: 14 mg/dL (ref 7–25)
CO2: 32 mmol/L (ref 20–32)
Calcium: 9.6 mg/dL (ref 8.6–10.4)
Chloride: 93 mmol/L — ABNORMAL LOW (ref 98–110)
Creat: 0.45 mg/dL — ABNORMAL LOW (ref 0.60–0.95)
Globulin: 2.3 g/dL (calc) (ref 1.9–3.7)
Glucose, Bld: 72 mg/dL (ref 65–99)
Potassium: 4.7 mmol/L (ref 3.5–5.3)
Sodium: 131 mmol/L — ABNORMAL LOW (ref 135–146)
Total Bilirubin: 0.8 mg/dL (ref 0.2–1.2)
Total Protein: 6.6 g/dL (ref 6.1–8.1)
eGFR: 96 mL/min/{1.73_m2} (ref >=60)

## 2020-11-10 LAB — VITAMIN D 25 HYDROXY (VIT D DEFICIENCY, FRACTURES): Vit D, 25-Hydroxy: 41 ng/mL (ref 30–100)

## 2020-11-10 NOTE — Progress Notes (Signed)
Absolute lymphocytes are slightly low. WBC count is WNL.  Rest of CBC WNL.   Sodium and chloride remain low.  Please forward lab work to PCP as requested.  Vitamin D is WNL.   Ok to schedule prolia once she is eligible for her next injection.

## 2020-11-10 NOTE — Telephone Encounter (Signed)
Submitted a Prior Authorization request to [facility] for [facility] via CoverMyMeds. Will update once we receive a response.   ([id number]) - [id number]

## 2020-11-10 NOTE — Telephone Encounter (Signed)
Please place prolia orders pending lab results, per Hazel Sams, PA-C. Thanks!

## 2020-11-13 ENCOUNTER — Other Ambulatory Visit: Payer: Self-pay | Admitting: Pharmacist

## 2020-11-13 DIAGNOSIS — M810 Age-related osteoporosis without current pathological fracture: Secondary | ICD-10-CM

## 2020-11-13 NOTE — Telephone Encounter (Signed)
Part B drugs are covered at 80%. Patient will be responsible for the 20%

## 2020-11-13 NOTE — Telephone Encounter (Signed)
Received notification from [facility] regarding a prior authorization for [facility] through medical [phone]). Authorization has been APPROVED from 11/22/19 to 03/31/21.   Authorization # [id number]  Knox Saliva, PharmD, MPH, BCPS Clinical Pharmacist (Rheumatology and Pulmonology)

## 2020-11-13 NOTE — Telephone Encounter (Signed)
Prolia orders placed today. Called patient to provide Medical Day phone number but unable to reach. Left VM with Medical Day phone number and advised that she can schedule though she is due on or after 11/26/20  Knox Saliva, PharmD, MPH, BCPS Clinical Pharmacist (Rheumatology and Pulmonology)

## 2020-11-13 NOTE — Progress Notes (Addendum)
Next infusion not yet scheduled for Prolia SQ and due for updated orders. She is due for Prolia on or after 11/26/20 Diagnosis: age-related osteoporosis  Dose: 60 mg SQ every 6 months  Last Clinic Visit: 11/09/20 Next Clinic Visit: 04/11/21  Last infusion: 05/29/20  Labs: 11/09/20, vitamin D wnl, absolute lymphocytes slightly low and WBC wnl. CMP showed low sodium and chloride and results sent to PCP  Orders placed for Prolia SQ x 1 dose. No premeds required  Left VM with patient and provided with phone number to schedule Prolia at  [facility] [phone]) [place]  Will follow-up to ensured scheduled for on or after 11/26/20 and completed  Knox Saliva, PharmD, MPH, BCPS Clinical Pharmacist (Rheumatology and Pulmonology)

## 2020-11-21 NOTE — Progress Notes (Signed)
Called patient and left VM to schedule Prolia.

## 2020-11-22 ENCOUNTER — Telehealth: Payer: Self-pay | Admitting: Rheumatology

## 2020-11-22 NOTE — Telephone Encounter (Signed)
Patient calling to thank you for reminding her to schedule her Prolia injection. It has been scheduled.

## 2020-11-23 NOTE — Telephone Encounter (Signed)
Prolia scheduled for 12/11/20. Will f/u to ensure completed  Knox Saliva, PharmD, MPH, BCPS Clinical Pharmacist (Rheumatology and Pulmonology)

## 2020-11-27 NOTE — Progress Notes (Signed)
Patient scheduled to receive Prolia at [facility] on 12/11/20  Knox Saliva, PharmD, MPH, BCPS Clinical Pharmacist (Rheumatology and Pulmonology)

## 2020-12-11 ENCOUNTER — Other Ambulatory Visit: Payer: Self-pay

## 2020-12-11 ENCOUNTER — Ambulatory Visit (HOSPITAL_COMMUNITY)
Admission: RE | Admit: 2020-12-11 | Discharge: 2020-12-11 | Disposition: A | Discharge status: Home / Self Care | Payer: Medicare PPO | Source: Ambulatory Visit | Attending: Rheumatology | Admitting: Rheumatology

## 2020-12-11 DIAGNOSIS — M810 Age-related osteoporosis without current pathological fracture: Secondary | ICD-10-CM | POA: Diagnosis not present

## 2020-12-11 MED ORDER — DENOSUMAB 60 MG/ML ~~LOC~~ SOSY
60.0000 mg | PREFILLED_SYRINGE | Freq: Once | SUBCUTANEOUS | Status: AC
  Administered 2020-12-11: 60 mg via SUBCUTANEOUS

## 2020-12-11 MED ORDER — DENOSUMAB 60 MG/ML ~~LOC~~ SOSY
PREFILLED_SYRINGE | SUBCUTANEOUS | Status: AC
  Filled 2020-12-11: qty 1

## 2020-12-27 ENCOUNTER — Other Ambulatory Visit: Payer: Self-pay | Admitting: *Deleted

## 2020-12-27 DIAGNOSIS — M353 Polymyalgia rheumatica: Secondary | ICD-10-CM

## 2020-12-27 MED ORDER — HYDROXYCHLOROQUINE SULFATE 200 MG PO TABS: ORAL_TABLET | ORAL | 0 refills | Status: DC

## 2020-12-27 NOTE — Telephone Encounter (Signed)
Refill request received via fax  Next Visit: 04/11/2021  Last Visit: 11/09/2020  Labs: 11/09/2020 Absolute lymphocytes are slightly low. WBC count is WNL.  Rest of CBC WNL.  Sodium and chloride remain low.  Eye exam: 11/10/2019 WNL   Current Dose per office note 11/09/2020: Plaquenil 200 mg 1 tablet by mouth every other day.   [id number] rheumatica   Last Fill: 09/25/2020  Patient advised she is due to update her PLQ eye exam. Patient states she has it schedule for October 2022.   Okay to refill Plaquenil?

## 2021-03-27 ENCOUNTER — Telehealth: Payer: Self-pay | Admitting: Rheumatology

## 2021-03-27 ENCOUNTER — Other Ambulatory Visit: Payer: Self-pay | Admitting: Physician Assistant

## 2021-03-27 DIAGNOSIS — M353 Polymyalgia rheumatica: Secondary | ICD-10-CM

## 2021-03-27 NOTE — Telephone Encounter (Signed)
Prescription sent to the pharmacy today.  See rx note for details.

## 2021-03-27 NOTE — Telephone Encounter (Signed)
Next Visit: 04/11/2021   Last Visit: 11/09/2020   Labs: 11/09/2020 Absolute lymphocytes are slightly low. WBC count is WNL.  Rest of CBC WNL.  Sodium and chloride remain low.   Eye exam:  02/12/2021 WNL    Current Dose per office note 11/09/2020: Plaquenil 200 mg 1 tablet by mouth every other day.    [id number] rheumatica    Last Fill: 12/27/2020  Okay to refill PLQ?

## 2021-03-27 NOTE — Telephone Encounter (Signed)
Patient called the office requesting a refill of Hydroxychloroquine 200 mg to be sent to [facility] on [address].

## 2021-03-28 NOTE — Progress Notes (Signed)
Office Visit Note  Patient: Ellen Thomas             Date of Birth: 03-Aug-1935           MRN: [id number]             PCP: Bernerd Limbo, MD Referring: Bernerd Limbo, MD Visit Date: 04/11/2021 Occupation: @GUAROCC @  Subjective:  Medical management   History of Present Illness: Ellen Thomas is a 85 y.o. female with a history of polymyalgia rheumatica, osteoarthritis, degenerative disc disease and osteoporosis.  She has been taking Prolia injections which she has been tolerating well.  She states recently she has been experiencing sensation of compression on trachea especially in certain positions of her neck.  She experiences that when she goes to the dentist.  She continues to have some stiffness and discomfort in her hands and feet but no swelling.  She has mild Raynauds symptoms which are manageable with warm clothing and keeping core temperature warm.  Activities of Daily Living:  Patient reports morning stiffness for 0 minutes.   Patient Denies nocturnal pain.  Difficulty dressing/grooming: Denies Difficulty climbing stairs: Denies Difficulty getting out of chair: Denies Difficulty using hands for taps, buttons, cutlery, and/or writing: Reports  Review of Systems  Constitutional:  Negative for fatigue.  HENT:  Negative for mouth sores, mouth dryness and nose dryness.   Eyes:  Positive for itching and dryness. Negative for pain.  Respiratory:  Negative for shortness of breath and difficulty breathing.   Cardiovascular:  Negative for chest pain and palpitations.  Gastrointestinal:  Positive for constipation. Negative for blood in stool and diarrhea.  Endocrine: Negative for increased urination.  Genitourinary:  Negative for difficulty urinating.  Musculoskeletal:  Positive for joint pain and joint pain. Negative for joint swelling, myalgias, morning stiffness, muscle tenderness and myalgias.  Skin:  Positive for color change. Negative for rash and redness.   Allergic/Immunologic: Negative for susceptible to infections.  Neurological:  Positive for numbness. Negative for dizziness, headaches, memory loss and weakness.  Hematological:  Negative for bruising/bleeding tendency.  Psychiatric/Behavioral:  Negative for confusion.    PMFS History:  Patient Active Problem List   Diagnosis Date Noted   Dehydration 01/25/2019   Diverticulitis 01/25/2019   Hyponatremia 01/25/2019   History of humerus fracture 04/23/2016   History of gastroesophageal reflux (GERD) 04/23/2016   Positive ANA (antinuclear antibody) 04/23/2016   History of poliomyelitis 04/23/2016   Vitiligo 04/23/2016   Scoliosis 04/23/2016   Foreign body granuloma of skin and subcutaneous tissue 11/16/2015   At risk for falling 03/24/2015   Bronchitis 03/10/2014   Encounter for general adult medical examination without abnormal findings 02/13/2014   BMI (body mass index), pediatric, 5% to less than 85% for age 38/07/2013   Hypercholesterolemia 04/05/2013   Benign hypertension 04/05/2013   Polymyalgia rheumatica ([place]) 04/05/2013   Other specified health status 04/05/2013   Disease of skin and subcutaneous tissue 03/11/2013   OP (osteoporosis) 03/11/2013   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   Esophagitis, reflux 03/11/2013   Neuralgia neuritis, sciatic nerve 03/11/2013   Encounter for screening for eye and ear disorders 03/11/2013   MGUS (monoclonal gammopathy of unknown significance) 11/01/2011    Past Medical History:  Diagnosis Date   High cholesterol    Hypertension    Kyphosis    PMR (polymyalgia rheumatica) (HCC)    Polio    Scoliosis    Vitiligo     Family History  Problem Relation Age  of Onset   Diabetes Mother    Cancer Brother    Past Surgical History:  Procedure Laterality Date   ABDOMINAL HYSTERECTOMY  1993   SHOULDER FUSION SURGERY Left    took a piece of hip and fused it into left shoulder   TOTAL SHOULDER ARTHROPLASTY      Social History   Social History Narrative   Not on file   Immunization History  Administered Date(s) Administered   Influenza,inj,Quad PF,6+ Mos 12/15/2006, 02/13/2009, 02/14/2010, 02/18/2011, 02/10/2015, 02/14/2016, 01/31/2017, 02/03/2018   PFIZER(Purple Top)SARS-COV-2 Vaccination 05/07/2019, 06/01/2019, 12/29/2019   Pneumococcal Conjugate-13 02/14/2012   Pneumococcal Polysaccharide-23 08/12/2014     Objective: Vital Signs: BP (!) 154/72 (BP Location: Right Arm, Patient Position: Sitting, Cuff Size: Normal)    Pulse 80    Ht 5\' 1"  (1.549 m)    Wt 97 lb (44 kg)    BMI 18.33 kg/m    Physical Exam Vitals and nursing note reviewed.  Constitutional:      Appearance: She is well-developed.  HENT:     Head: Normocephalic and atraumatic.  Eyes:     Conjunctiva/sclera: Conjunctivae normal.  Cardiovascular:     Rate and Rhythm: Normal rate and regular rhythm.     Heart sounds: Normal heart sounds.  Pulmonary:     Effort: Pulmonary effort is normal.     Breath sounds: Normal breath sounds.  Abdominal:     General: Bowel sounds are normal.     Palpations: Abdomen is soft.  Musculoskeletal:     Cervical back: Normal range of motion.  Lymphadenopathy:     Cervical: No cervical adenopathy.  Skin:    General: Skin is warm and dry.     Capillary Refill: Capillary refill takes less than 2 seconds.  Neurological:     Mental Status: She is alert and oriented to person, place, and time.     Comments: Left-sided weakness due to history of polio.  Psychiatric:        Behavior: Behavior normal.     Musculoskeletal Exam: She had limited range of motion of her cervical spine.  She hyperextends her neck.  She had thoracolumbar scoliosis.  She has not limited range of motion of bilateral shoulder joints.  Left elbow contracture without synovitis was noted.  She had good range of motion of her wrist joints.  PIP and DIP thickening with no synovitis was noted.  Hip joints and knee joints with  good range of motion.  No warmth swelling or effusion was noted.  There was no tenderness over ankles or MTPs.  CDAI Exam: CDAI Score: -- Patient Global: --; Provider Global: -- Swollen: --; Tender: -- Joint Exam 04/11/2021   No joint exam has been documented for this visit   There is currently no information documented on the homunculus. Go to the Rheumatology activity and complete the homunculus joint exam.  Investigation: No additional findings.  Imaging: No results found.  Recent Labs: Lab Results  Component Value Date   WBC 4.3 11/09/2020   HGB 13.7 11/09/2020   PLT 238 11/09/2020   NA 131 (L) 11/09/2020   K 4.7 11/09/2020   CL 93 (L) 11/09/2020   CO2 32 11/09/2020   GLUCOSE 72 11/09/2020   BUN 14 11/09/2020   CREATININE 0.45 (L) 11/09/2020   BILITOT 0.8 11/09/2020   ALKPHOS 55 01/26/2019   AST 21 11/09/2020   ALT 16 11/09/2020   PROT 6.6 11/09/2020   ALBUMIN 3.9 01/26/2019   CALCIUM 9.6 11/09/2020  GFRAA 104 05/12/2020    Speciality Comments: PLQ eye exam: 02/12/2021 WNL @ [facility] Follow up in 1 year  Prolia: 06/20/16, 08/11/17, 02/06/18, 08/17/18, 02/17/19, 05/17/19, 11/22/19, 05/29/20  Procedures:  No procedures performed Allergies: Other and Tetracyclines & related   Assessment / Plan:     Visit Diagnoses: Polymyalgia rheumatica ([place]) - PMR is in remission.  She denies any any increased muscle weakness or tenderness.  She had good baseline strength in her extremities.  High risk medication use - Plaquenil 200 mg 1 tablet by mouth every other day. PLQ eye exam: 02/12/2021 - Plan: CBC with Differential/Platelet, COMPLETE METABOLIC PANEL WITH GFR today and then every 5 months.  Immunization is up-to-date.  Primary osteoarthritis of both hands-she has PIP and DIP thickening.  Joint protection muscle strengthening was discussed.  Primary osteoarthritis of both feet-she is currently not having much discomfort in her feet.  Proper fitting shoes were  discussed.  DDD (degenerative disc disease), cervical -I reviewed CT scan of her cervical spine from 2010 which showed some degenerative changes and spinal stenosis.  She states she is been experiencing increased sensation of tracheal compression.  She states that it happens in certain positions.  I will refer her to ENT and also will obtain x-rays of her cervical spine.  Plan: XR Cervical Spine Complete, Ambulatory referral to ENT  Age-related osteoporosis without current pathological fracture - DEXA: July 13, 2019 T score -2.2 left femur overall BMD improved. last Prolia injection: 12/11/2020.  Calcium rich diet and vitamin D was discussed.  Vitamin D deficiency-she has been taking vitamin D supplement and her vitamin D has been normal.  History of humerus fracture  Scoliosis - Thoracolumbar scoliosis noted.  She is currently not having any discomfort in the thoracic or lumbar region.  Positive ANA (antinuclear antibody)-she has positive ANA and Raynauds.  Her symptoms are stable.  There was no evidence of nailbed capillary changes, sclerodactyly or telangiectasias.  Other fatigue-improved.  Other medical problems are listed as follows:  Vitiligo  History of diverticulitis  History of gastroesophageal reflux (GERD)  History of poliomyelitis  Dyslipidemia  History of hypertension  MGUS (monoclonal gammopathy of unknown significance)  Orders: Orders Placed This Encounter  Procedures   XR Cervical Spine Complete   CBC with Differential/Platelet   COMPLETE METABOLIC PANEL WITH GFR   Ambulatory referral to ENT   No orders of the defined types were placed in this encounter.    Follow-Up Instructions: Return in about 5 months (around 09/09/2021) for Osteoarthritis, Osteoporosis.   Bo Merino, MD  Note - This record has been created using Editor, commissioning.  Chart creation errors have been sought, but may not always  have been located. Such creation errors do not  reflect on  the standard of medical care.

## 2021-04-11 ENCOUNTER — Ambulatory Visit: Payer: Medicare PPO | Admitting: Rheumatology

## 2021-04-11 ENCOUNTER — Other Ambulatory Visit: Payer: Self-pay

## 2021-04-11 ENCOUNTER — Encounter: Payer: Self-pay | Admitting: Rheumatology

## 2021-04-11 VITALS — BP 154/72 | HR 80 | Ht 61.0 in | Wt 97.0 lb

## 2021-04-11 DIAGNOSIS — M810 Age-related osteoporosis without current pathological fracture: Secondary | ICD-10-CM

## 2021-04-11 DIAGNOSIS — E785 Hyperlipidemia, unspecified: Secondary | ICD-10-CM

## 2021-04-11 DIAGNOSIS — M19071 Primary osteoarthritis, right ankle and foot: Secondary | ICD-10-CM

## 2021-04-11 DIAGNOSIS — R5383 Other fatigue: Secondary | ICD-10-CM

## 2021-04-11 DIAGNOSIS — Z8612 Personal history of poliomyelitis: Secondary | ICD-10-CM

## 2021-04-11 DIAGNOSIS — Z8719 Personal history of other diseases of the digestive system: Secondary | ICD-10-CM

## 2021-04-11 DIAGNOSIS — D472 Monoclonal gammopathy: Secondary | ICD-10-CM

## 2021-04-11 DIAGNOSIS — M41125 Adolescent idiopathic scoliosis, thoracolumbar region: Secondary | ICD-10-CM

## 2021-04-11 DIAGNOSIS — M353 Polymyalgia rheumatica: Secondary | ICD-10-CM | POA: Diagnosis not present

## 2021-04-11 DIAGNOSIS — Z79899 Other long term (current) drug therapy: Secondary | ICD-10-CM | POA: Diagnosis not present

## 2021-04-11 DIAGNOSIS — M19041 Primary osteoarthritis, right hand: Secondary | ICD-10-CM

## 2021-04-11 DIAGNOSIS — E559 Vitamin D deficiency, unspecified: Secondary | ICD-10-CM

## 2021-04-11 DIAGNOSIS — M19072 Primary osteoarthritis, left ankle and foot: Secondary | ICD-10-CM

## 2021-04-11 DIAGNOSIS — L80 Vitiligo: Secondary | ICD-10-CM

## 2021-04-11 DIAGNOSIS — M19042 Primary osteoarthritis, left hand: Secondary | ICD-10-CM

## 2021-04-11 DIAGNOSIS — Z8679 Personal history of other diseases of the circulatory system: Secondary | ICD-10-CM

## 2021-04-11 DIAGNOSIS — Z8781 Personal history of (healed) traumatic fracture: Secondary | ICD-10-CM

## 2021-04-11 DIAGNOSIS — R768 Other specified abnormal immunological findings in serum: Secondary | ICD-10-CM

## 2021-04-11 DIAGNOSIS — M5030 Other cervical disc degeneration, unspecified cervical region: Secondary | ICD-10-CM

## 2021-04-11 LAB — CBC WITH DIFFERENTIAL/PLATELET
Absolute Monocytes: 339 cells/uL (ref 200–950)
Basophils Absolute: 22 cells/uL (ref 0–200)
Basophils Relative: 0.5 %
Eosinophils Absolute: 48 cells/uL (ref 15–500)
Eosinophils Relative: 1.1 %
HCT: 42.2 % (ref 35.0–45.0)
Hemoglobin: 14.2 g/dL (ref 11.7–15.5)
Lymphs Abs: 752 cells/uL — ABNORMAL LOW (ref 850–3900)
MCH: 30.8 pg (ref 27.0–33.0)
MCHC: 33.6 g/dL (ref 32.0–36.0)
MCV: 91.5 fL (ref 80.0–100.0)
MPV: 9 fL (ref 7.5–12.5)
Monocytes Relative: 7.7 %
Neutro Abs: 3238 cells/uL (ref 1500–7800)
Neutrophils Relative %: 73.6 %
Platelets: 194 10*3/uL (ref 140–400)
RBC: 4.61 10*6/uL (ref 3.80–5.10)
RDW: 11.8 % (ref 11.0–15.0)
Total Lymphocyte: 17.1 %
WBC: 4.4 10*3/uL (ref 3.8–10.8)

## 2021-04-11 LAB — COMPLETE METABOLIC PANEL WITH GFR
AG Ratio: 2 (calc) (ref 1.0–2.5)
ALT: 14 U/L (ref 6–29)
AST: 22 U/L (ref 10–35)
Albumin: 4.5 g/dL (ref 3.6–5.1)
Alkaline phosphatase (APISO): 55 U/L (ref 37–153)
BUN/Creatinine Ratio: 29 (calc) — ABNORMAL HIGH (ref 6–22)
BUN: 16 mg/dL (ref 7–25)
CO2: 34 mmol/L — ABNORMAL HIGH (ref 20–32)
Calcium: 9.9 mg/dL (ref 8.6–10.4)
Chloride: 94 mmol/L — ABNORMAL LOW (ref 98–110)
Creat: 0.55 mg/dL — ABNORMAL LOW (ref 0.60–0.95)
Globulin: 2.3 g/dL (calc) (ref 1.9–3.7)
Glucose, Bld: 88 mg/dL (ref 65–99)
Potassium: 4.9 mmol/L (ref 3.5–5.3)
Sodium: 133 mmol/L — ABNORMAL LOW (ref 135–146)
Total Bilirubin: 0.7 mg/dL (ref 0.2–1.2)
Total Protein: 6.8 g/dL (ref 6.1–8.1)
eGFR: 91 mL/min/{1.73_m2} (ref >=60)

## 2021-04-12 ENCOUNTER — Encounter: Payer: Self-pay | Admitting: Rheumatology

## 2021-04-12 NOTE — Progress Notes (Signed)
Sodium and chloride are low and stable.  Lymphocyte count is low due to Plaquenil use and stable.

## 2021-04-27 ENCOUNTER — Telehealth: Payer: Self-pay | Admitting: Rheumatology

## 2021-04-27 NOTE — Telephone Encounter (Signed)
Patient is now scheduled for 05/21/2021 at 9:30am with Dr. Bunnie Philips. I called patient and left appointment details on answering machine.

## 2021-04-27 NOTE — Telephone Encounter (Signed)
Patient left a voicemail stating she had received a voicemail from [place] but was unable to hear her in the message. Patient requests a call back. [phone]

## 2021-06-07 ENCOUNTER — Telehealth: Payer: Self-pay | Admitting: Pharmacist

## 2021-06-07 ENCOUNTER — Telehealth: Payer: Self-pay | Admitting: Rheumatology

## 2021-06-07 DIAGNOSIS — E559 Vitamin D deficiency, unspecified: Secondary | ICD-10-CM

## 2021-06-07 DIAGNOSIS — M810 Age-related osteoporosis without current pathological fracture: Secondary | ICD-10-CM

## 2021-06-07 DIAGNOSIS — Z79899 Other long term (current) drug therapy: Secondary | ICD-10-CM

## 2021-06-07 NOTE — Telephone Encounter (Signed)
Patient left a voicemail requesting a call back from [facility]. Patient states she missed a call this morning. ?

## 2021-06-07 NOTE — Telephone Encounter (Signed)
Patient due for Prolia on or after 06/09/21. Patient requires prior authorization through medical benefit. Submitted via CMM to [facility] ? ?Key: [id number] ? ?ATC patient to review labs needed. Left VM requesting return call. Future lab orders for CBC, CMP, and Vitamin D placed today ? ?Knox Saliva, PharmD, MPH, BCPS ?Clinical Pharmacist (Rheumatology and Pulmonology) ?

## 2021-06-08 NOTE — Telephone Encounter (Signed)
Received notification from [facility] regarding a prior authorization for [place]. Authorization has been APPROVED from 11/22/19 to 03/31/22 through medical benefit ([id number]) ? ?Authorization # [id number] ?Phone # [phone] ? ?Knox Saliva, PharmD, MPH, BCPS ?Clinical Pharmacist (Rheumatology and Pulmonology) ? ?

## 2021-06-11 NOTE — Telephone Encounter (Signed)
Returned call to patient regarding Prolia. Unable to reach. Left detailed VM regarding labs and lab hours ? ?Knox Saliva, PharmD, MPH, BCPS ?Clinical Pharmacist (Rheumatology and Pulmonology) ?

## 2021-06-11 NOTE — Telephone Encounter (Signed)
Patient returned cal. She will try to stop by tomorrow, 06/12/21 to have Prolia labs completed ? ?Knox Saliva, PharmD, MPH, BCPS ?Clinical Pharmacist (Rheumatology and Pulmonology) ?

## 2021-06-14 ENCOUNTER — Other Ambulatory Visit: Payer: Self-pay | Admitting: *Deleted

## 2021-06-14 DIAGNOSIS — E559 Vitamin D deficiency, unspecified: Secondary | ICD-10-CM

## 2021-06-14 DIAGNOSIS — Z79899 Other long term (current) drug therapy: Secondary | ICD-10-CM

## 2021-06-15 LAB — CBC WITH DIFFERENTIAL/PLATELET
Absolute Monocytes: 462 cells/uL (ref 200–950)
Basophils Absolute: 21 cells/uL (ref 0–200)
Basophils Relative: 0.5 %
Eosinophils Absolute: 59 cells/uL (ref 15–500)
Eosinophils Relative: 1.4 %
HCT: 39.6 % (ref 35.0–45.0)
Hemoglobin: 13.2 g/dL (ref 11.7–15.5)
Lymphs Abs: 987 cells/uL (ref 850–3900)
MCH: 30.3 pg (ref 27.0–33.0)
MCHC: 33.3 g/dL (ref 32.0–36.0)
MCV: 91 fL (ref 80.0–100.0)
MPV: 9.1 fL (ref 7.5–12.5)
Monocytes Relative: 11 %
Neutro Abs: 2671 cells/uL (ref 1500–7800)
Neutrophils Relative %: 63.6 %
Platelets: 190 10*3/uL (ref 140–400)
RBC: 4.35 10*6/uL (ref 3.80–5.10)
RDW: 11.6 % (ref 11.0–15.0)
Total Lymphocyte: 23.5 %
WBC: 4.2 10*3/uL (ref 3.8–10.8)

## 2021-06-15 LAB — COMPREHENSIVE METABOLIC PANEL
AG Ratio: 2 (calc) (ref 1.0–2.5)
ALT: 15 U/L (ref 6–29)
AST: 22 U/L (ref 10–35)
Albumin: 4.3 g/dL (ref 3.6–5.1)
Alkaline phosphatase (APISO): 50 U/L (ref 37–153)
BUN/Creatinine Ratio: 31 (calc) — ABNORMAL HIGH (ref 6–22)
BUN: 15 mg/dL (ref 7–25)
CO2: 30 mmol/L (ref 20–32)
Calcium: 9.7 mg/dL (ref 8.6–10.4)
Chloride: 94 mmol/L — ABNORMAL LOW (ref 98–110)
Creat: 0.48 mg/dL — ABNORMAL LOW (ref 0.60–0.95)
Globulin: 2.2 g/dL (calc) (ref 1.9–3.7)
Glucose, Bld: 108 mg/dL — ABNORMAL HIGH (ref 65–99)
Potassium: 4.4 mmol/L (ref 3.5–5.3)
Sodium: 133 mmol/L — ABNORMAL LOW (ref 135–146)
Total Bilirubin: 0.5 mg/dL (ref 0.2–1.2)
Total Protein: 6.5 g/dL (ref 6.1–8.1)

## 2021-06-15 LAB — VITAMIN D 25 HYDROXY (VIT D DEFICIENCY, FRACTURES): Vit D, 25-Hydroxy: 43 ng/mL (ref 30–100)

## 2021-06-15 NOTE — Progress Notes (Signed)
Sodium and potassium are low and stable.  Glucose is mildly elevated, probably not a fasting sample.  Vitamin D is 12 which is in desirable range.  CBC is normal.

## 2021-06-18 ENCOUNTER — Other Ambulatory Visit (HOSPITAL_COMMUNITY): Payer: Self-pay

## 2021-06-18 MED ORDER — PROLIA 60 MG/ML ~~LOC~~ SOSY
60.0000 mg | PREFILLED_SYRINGE | Freq: Once | SUBCUTANEOUS | 0 refills | Status: DC
  Filled 2021-06-18: qty 1, 1d supply, fill #0
  Filled 2021-06-20: qty 1, 168d supply, fill #0

## 2021-06-18 NOTE — Telephone Encounter (Signed)
Patient returned call - she said she's comfortable with copay. Rx sent to [facility] to be couriered to clinic before appt on 06/27/21. ? ?I advised patient that Barrett Shell will be reaching out to collect copay information and that his number will not be rheumatology clinic number. She verbalized understanding. ? ?Prolia appt scheduled for 06/27/21. ? ?Knox Saliva, PharmD, MPH, BCPS ?Clinical Pharmacist (Rheumatology and Pulmonology) ?

## 2021-06-18 NOTE — Telephone Encounter (Addendum)
Copay for Prolia through pharmacy benefit is $50. ? ?ATC patient to determine if she'd like to go through pharmacy or medical benefit. Unable to reach - left VM requesting return call ? ?Knox Saliva, PharmD, MPH, BCPS ?Clinical Pharmacist (Rheumatology and Pulmonology) ?

## 2021-06-20 ENCOUNTER — Other Ambulatory Visit (HOSPITAL_COMMUNITY): Payer: Self-pay

## 2021-06-21 ENCOUNTER — Other Ambulatory Visit (HOSPITAL_COMMUNITY): Payer: Self-pay

## 2021-06-21 NOTE — Telephone Encounter (Signed)
ATC pt and LVM. Verbally provided office number and also referenced MyChart message sent yesterday. Will await return call. ?

## 2021-06-22 NOTE — Telephone Encounter (Signed)
Prolia received from [facility]. Placed in refrigerator. ? ?Knox Saliva, PharmD, MPH, BCPS ?Clinical Pharmacist (Rheumatology and Pulmonology) ?

## 2021-06-22 NOTE — Telephone Encounter (Signed)
Delivery instructions have been updated in [place], medication will be couriered to [facility] prior to 06/27/21. ? ?Rx has been processed in [facility] and there is a copay of $50. Payment information has been collected and forwarded to the pharmacy. Due to late response, it is likely that copay was already charged to [facility] account. ?

## 2021-06-23 ENCOUNTER — Other Ambulatory Visit: Payer: Self-pay | Admitting: Physician Assistant

## 2021-06-23 DIAGNOSIS — M353 Polymyalgia rheumatica: Secondary | ICD-10-CM

## 2021-06-25 NOTE — Telephone Encounter (Signed)
Next Visit: 06/27/2021 ? ?Last Visit: 04/11/2021 ? ?Labs: 06/14/2021 Sodium and potassium are low and stable.  Glucose is mildly elevated, probably not a fasting sample. CBC is normal. ? ?Eye exam: 02/12/2021   ? ?Current Dose per office note 04/11/2021: Plaquenil 200 mg 1 tablet by mouth every other day ? ?[id number] rheumatica ? ?Last Fill: 03/27/2021 ? ?Okay to refill Plaquenil?  ?

## 2021-06-25 NOTE — Progress Notes (Signed)
Pharmacy Note ? ?Subjective:   ?Patient presents to clinic today to receive bi-annual dose of Prolia. Her last Prolia dose was on 12/11/20. ? ?Patient running a fever or have signs/symptoms of infection? No ? ?Patient currently on antibiotics for the treatment of infection? No ? ?Patient had fall in the last 6 months?  No    ? ?Patient taking calcium 1200 mg daily through diet or supplement and at least 800 units vitamin D? Yes ? ?Objective: ?CMP  ?   ?Component Value Date/Time  ? NA 133 (L) 06/14/2021 1450  ? NA 133 (L) 09/13/2013 1307  ? K 4.4 06/14/2021 1450  ? K 4.6 09/13/2013 1307  ? CL 94 (L) 06/14/2021 1450  ? CO2 30 06/14/2021 1450  ? CO2 28 09/13/2013 1307  ? GLUCOSE 108 (H) 06/14/2021 1450  ? GLUCOSE 74 09/13/2013 1307  ? BUN 15 06/14/2021 1450  ? BUN 13.3 09/13/2013 1307  ? CREATININE 0.48 (L) 06/14/2021 1450  ? CREATININE 0.7 09/13/2013 1307  ? CALCIUM 9.7 06/14/2021 1450  ? CALCIUM 9.4 09/13/2013 1307  ? PROT 6.5 06/14/2021 1450  ? PROT 7.4 09/13/2013 1307  ? ALBUMIN 3.9 01/26/2019 0546  ? ALBUMIN 3.9 09/13/2013 1307  ? AST 22 06/14/2021 1450  ? AST 17 09/13/2013 1307  ? ALT 15 06/14/2021 1450  ? ALT 12 09/13/2013 1307  ? ALKPHOS 55 01/26/2019 0546  ? ALKPHOS 69 09/13/2013 1307  ? BILITOT 0.5 06/14/2021 1450  ? BILITOT 0.58 09/13/2013 1307  ? GFRNONAA 90 05/12/2020 1216  ? GFRAA 104 05/12/2020 1216  ? ? ?CBC ?   ?Component Value Date/Time  ? WBC 4.2 06/14/2021 1450  ? RBC 4.35 06/14/2021 1450  ? HGB 13.2 06/14/2021 1450  ? HGB 13.7 09/13/2013 1307  ? HCT 39.6 06/14/2021 1450  ? HCT 42.9 09/13/2013 1307  ? PLT 190 06/14/2021 1450  ? PLT 239 09/13/2013 1307  ? MCV 91.0 06/14/2021 1450  ? MCV 91.0 09/13/2013 1307  ? MCH 30.3 06/14/2021 1450  ? MCHC 33.3 06/14/2021 1450  ? RDW 11.6 06/14/2021 1450  ? RDW 13.0 09/13/2013 1307  ? LYMPHSABS 987 06/14/2021 1450  ? LYMPHSABS 1.2 09/13/2013 1307  ? MONOABS 423 06/18/2016 1151  ? MONOABS 0.5 09/13/2013 1307  ? EOSABS 59 06/14/2021 1450  ? EOSABS 0.1 09/13/2013 1307   ? BASOSABS 21 06/14/2021 1450  ? BASOSABS 0.0 09/13/2013 1307  ? ?Lab Results  ?Component Value Date  ? VD25OH 43 06/14/2021  ? ?T-score: DEXA: July 13, 2019 T score -2.2 left femur overall BMD improved. Patient due for DEXA in April 2023. ? ?Assessment/Plan:  ? ?Patient tolerated injection without issue.  ? ?Administrations This Visit   ? ? denosumab (PROLIA) injection 60 mg   ? ? Admin Date ?06/27/2021 Action ?Given Dose ?60 mg Route ?Subcutaneous Administered By ?Cassandria Anger, RPH  ? ?  ?  ? ?  ?  ?Her next Prolia is due on 12/24/21. She is due for updated DEXA on 07/12/21. Order placed today for patient to have it scheduled with [facility] as before. ? ?F/u with provider in 6 months - appt scheduled for 10/24/21 ? ?All questions encouraged and answered.  Instructed patient to call with any further questions or concerns. ? ?Knox Saliva, PharmD, MPH, BCPS ?Clinical Pharmacist (Rheumatology and Pulmonology) ?

## 2021-06-27 ENCOUNTER — Ambulatory Visit (INDEPENDENT_AMBULATORY_CARE_PROVIDER_SITE_OTHER): Payer: Medicare PPO | Admitting: Pharmacist

## 2021-06-27 ENCOUNTER — Other Ambulatory Visit: Payer: Self-pay

## 2021-06-27 VITALS — BP 165/66 | HR 71

## 2021-06-27 DIAGNOSIS — Z7689 Persons encountering health services in other specified circumstances: Secondary | ICD-10-CM | POA: Diagnosis not present

## 2021-06-27 DIAGNOSIS — Z79899 Other long term (current) drug therapy: Secondary | ICD-10-CM | POA: Diagnosis not present

## 2021-06-27 DIAGNOSIS — M810 Age-related osteoporosis without current pathological fracture: Secondary | ICD-10-CM | POA: Diagnosis not present

## 2021-06-27 DIAGNOSIS — Z8781 Personal history of (healed) traumatic fracture: Secondary | ICD-10-CM | POA: Diagnosis not present

## 2021-06-27 MED ORDER — DENOSUMAB 60 MG/ML ~~LOC~~ SOSY
60.0000 mg | PREFILLED_SYRINGE | Freq: Once | SUBCUTANEOUS | Status: AC
  Administered 2021-06-27: 60 mg via SUBCUTANEOUS
  Filled 2021-06-27: qty 1

## 2021-06-27 NOTE — Patient Instructions (Signed)
Next Prolia is due 12/24/21. Continue calcium and vitamin D ? ?Will need labs completed within a month before the next due date. ? ?We have open lab daily: ?Monday through Thursday from 1:30-4:30 PM and Friday from 1:30-4:00 PM ?at the office of Dr. Bo Merino, [facility].   ?Please be advised, all patients with office appointments requiring lab work will take precedent over walk-in lab work.  ?If possible, please come for your lab work on Monday and Friday afternoons, as you may experience shorter wait times. ?The office is located at [address] [place], [place], [place] [address] ?No appointment is necessary.   ?Labs are drawn by [facility]. Please bring your co-pay at the time of your lab draw.  You may receive a bill from [facility] for your lab work. ? ?If you wish to have your labs drawn at another location, please call the office 24 hours in advance to send orders. ? ?If you have any questions regarding directions or hours of operation,  ?please call [phone].   ?As a reminder, please drink plenty of water prior to coming for your lab work. Thanks! ?

## 2021-08-25 IMAGING — CT CT ABD-PELV W/ CM
2 of 5 series · 14 of 46 positions shown, 16 images · IV contrast (omnipaque)
Comparison: None.

CLINICAL DATA: 82-year-old who is currently on day 5 of treatment
for diverticulitis, presenting with persistent generalized abdominal
pain, nausea and constipation.

EXAM:
CT ABDOMEN AND PELVIS WITH CONTRAST
TECHNIQUE: Multidetector CT imaging of the abdomen and pelvis was performed
using the standard protocol following bolus administration of
intravenous contrast.
CONTRAST:  100mL OMNIPAQUE IOHEXOL 300 MG/ML IV.

[Series 2: axial st · axial · 0.60mm/px · z∈[-612,-262]mm · 11 of 84 slices shown, 13 images]
[im 7/84  soft-tissue]
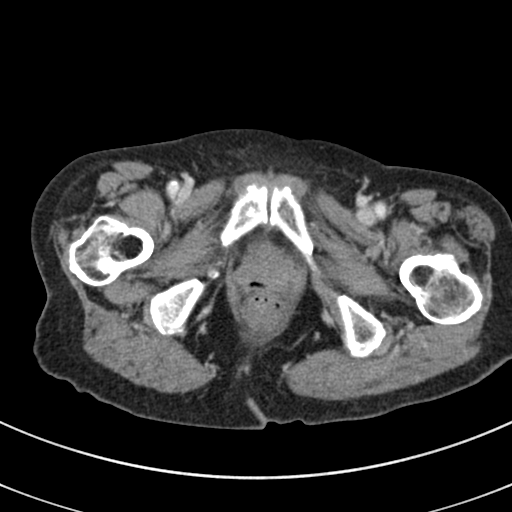
[im 7/84  bone]
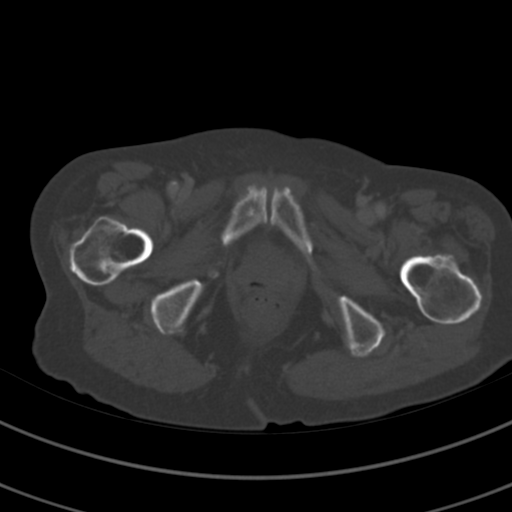
[im 14/84  soft-tissue]
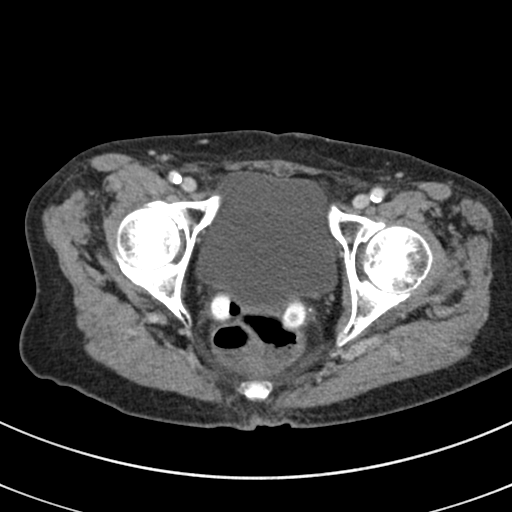
[im 21/84  soft-tissue]
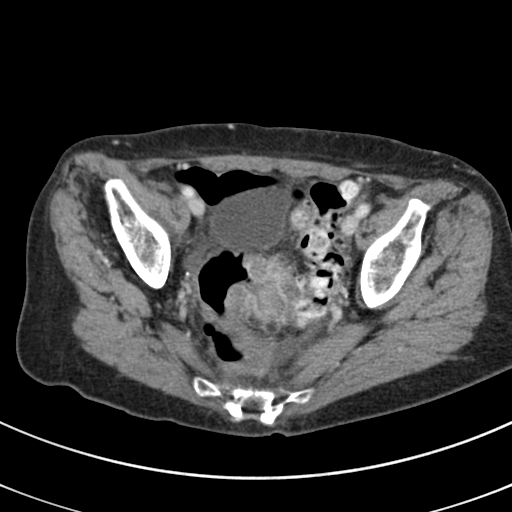
[im 28/84  soft-tissue]
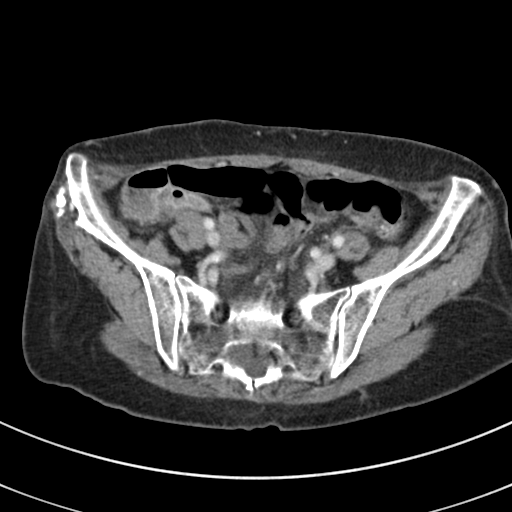
[im 35/84  soft-tissue]
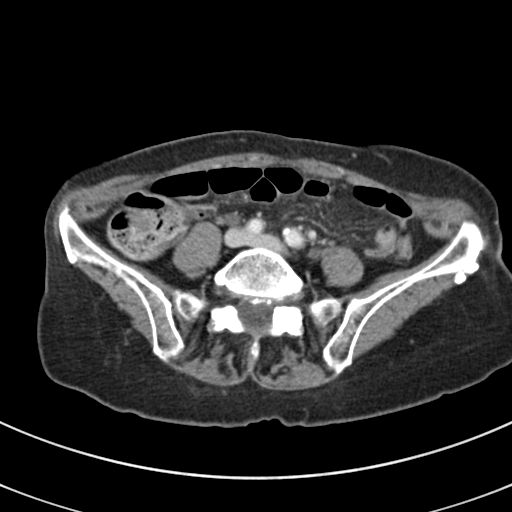
[im 42/84  soft-tissue]
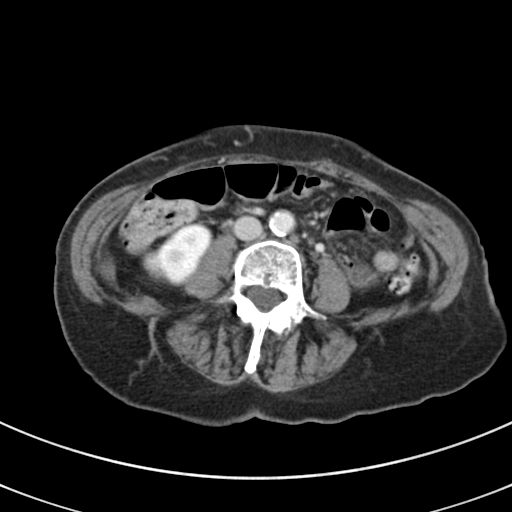
[im 49/84  soft-tissue]
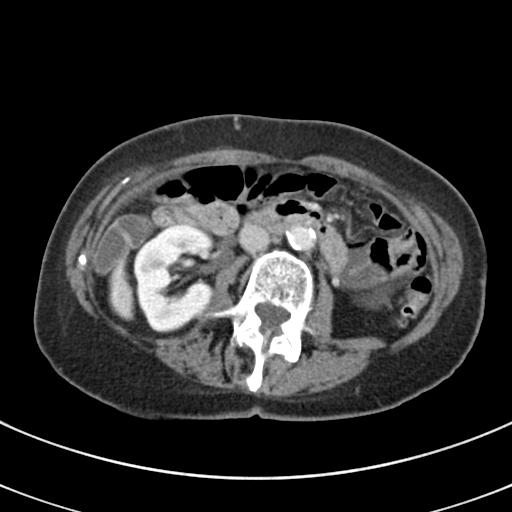
[im 56/84  soft-tissue]
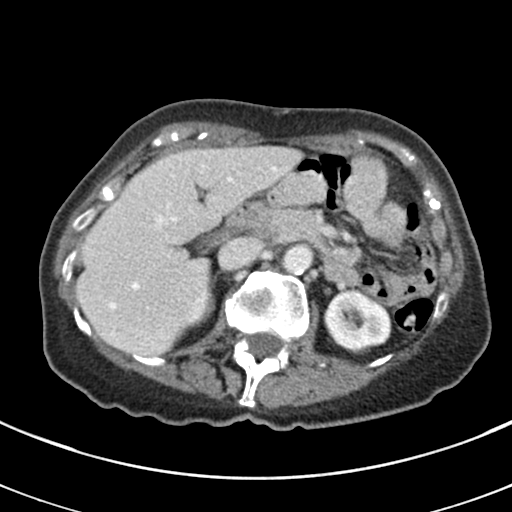
[im 63/84  soft-tissue]
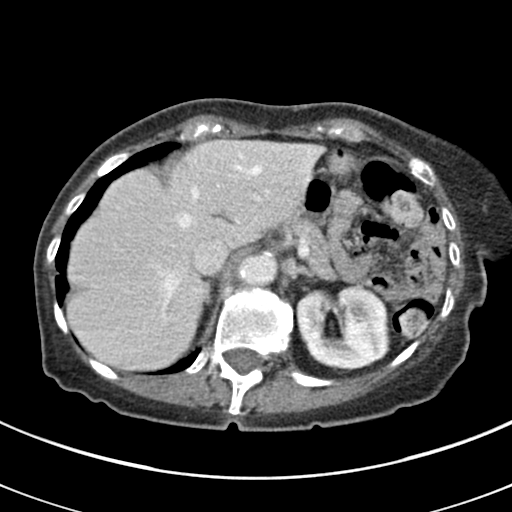
[im 63/84  bone]
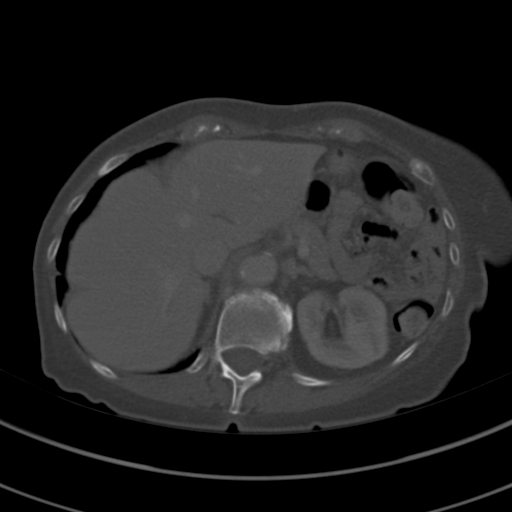
[im 70/84  soft-tissue]
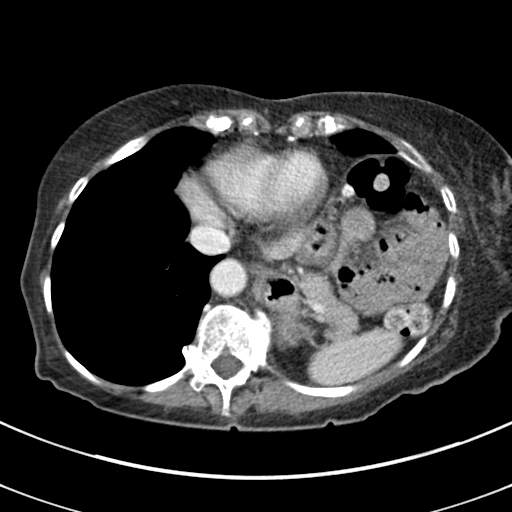
[im 77/84  soft-tissue]
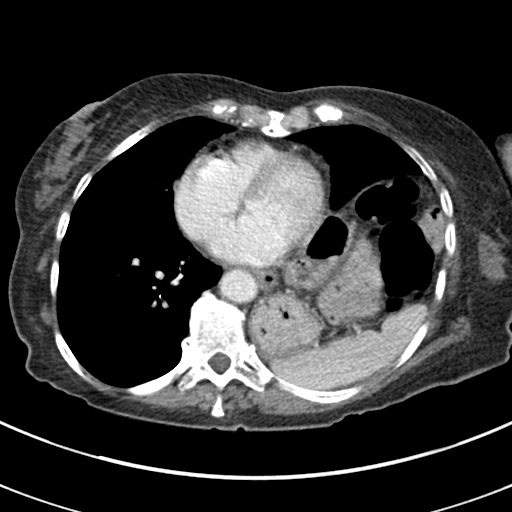

[Series 5: coronal st · coronal · 0.67mm/px · 3 of 107 slices shown]
[im 36/107  soft-tissue]
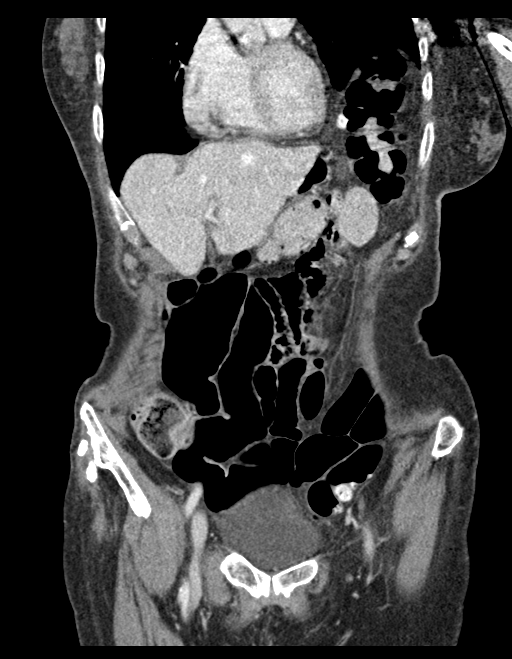
[im 48/107  soft-tissue]
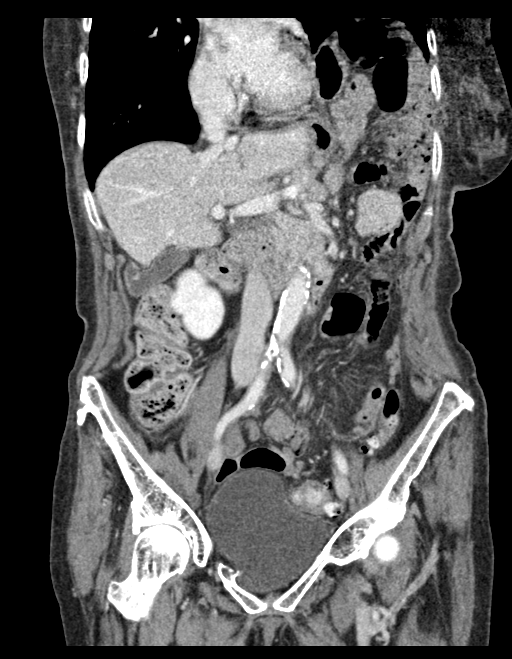
[im 59/107  soft-tissue]
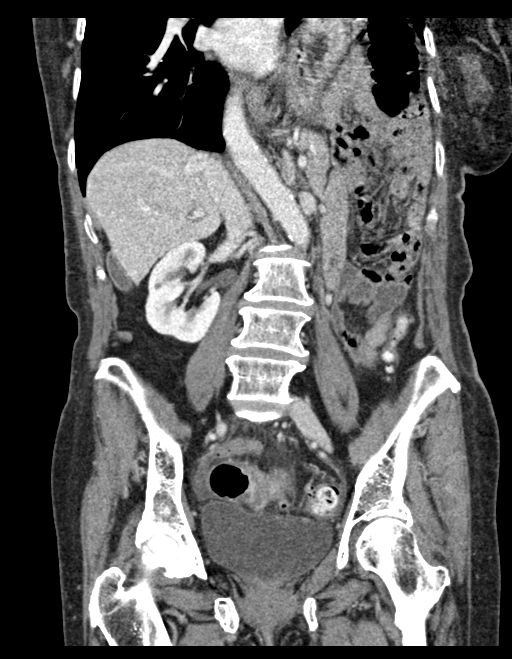

[14 of 46 positions shown; findings below may reference images not displayed]

FINDINGS: Lower chest: Elevation of the LEFT hemidiaphragm. Visualized lung
bases clear. Heart size normal. Mild LAD and RIGHT coronary
atherosclerosis.

Hepatobiliary: Benign approximate 1.3 cm cyst involving the anterior
segment RIGHT lobe of liver. No significant focal hepatic
parenchymal abnormality. Gallbladder normal in appearance without
calcified gallstones. No biliary ductal dilation.

Pancreas: Atrophic pancreatic head. Normal appearing pancreatic body
and tail. No peripancreatic edema.

Spleen: Normal in size and appearance.

Adrenals/Urinary Tract: Normal appearing adrenal glands. Benign
cortical cysts involving the RIGHT kidney. No significant
parenchymal abnormality involving either kidney. No hydronephrosis.
No urinary tract calculi. Normal appearing urinary bladder.

Stomach/Bowel: Stomach normal in appearance for the degree of
distention. Normal-appearing small bowel. Descending and sigmoid
colon diverticulosis. Diverticulitis involving the distal sigmoid
colon at the rectosigmoid junction, associated with marked wall
thickening and luminal narrowing. Liquid stool in the rectum.
Moderate to large stool burden in the colon. Appendix not
conspicuous, but no pericecal inflammation.

Vascular/Lymphatic: Severe aortoiliofemoral atherosclerosis without
evidence of aneurysm. Normal-appearing portal venous and systemic
venous systems.

No pathologic lymphadenopathy.

Reproductive: Surgically absent uterus. No adnexal masses.

Other: Pessary device in the low pelvis.

Musculoskeletal: Thoracic dextroscoliosis and compensatory lumbar
levoscoliosis. Mild compression fracture of the upper endplate of L2
which does not appear acute.
IMPRESSION: 1. Acute diverticulitis involving the distal sigmoid colon at the
rectosigmoid junction, associated with marked wall thickening and
marked luminal narrowing. No evidence of abscess or perforation.
2. Moderate to large stool burden in the colon, query functional
obstruction due to the narrowed sigmoid colon lumen.
3. Descending and sigmoid colon diverticulosis.

Aortic Atherosclerosis (NS40K-3Y9.9).

## 2021-09-21 ENCOUNTER — Other Ambulatory Visit: Payer: Self-pay | Admitting: Physician Assistant

## 2021-09-21 DIAGNOSIS — M353 Polymyalgia rheumatica: Secondary | ICD-10-CM

## 2021-10-10 NOTE — Progress Notes (Unsigned)
Office Visit Note  Patient: Ellen Thomas             Date of Birth: 01-14-1936           MRN: [id number]             PCP: Bernerd Limbo, MD Referring: Bernerd Limbo, MD Visit Date: 10/24/2021 Occupation: '@GUAROCC'$ @  Subjective:  Discuss medication options   History of Present Illness: Ellen Thomas is a 86 y.o. female with history of PMR, osteoporosis, and osteoarthritis.  Patient remains on Plaquenil 200 mg 1 tablet by mouth every other day.  She was evaluated by her ophthalmologist Dr. Carolyn Stare on 08/22/2021.  The patient is known to have macular degeneration bilaterally which has made it difficult to spot Plaquenil toxicity in early stages.  She presented today to discuss other treatment options.  She states that in June she underwent bilateral cataract surgery which was successful.  She has continued to use steroid drops and is tapering accordingly. She denies any signs or symptoms of a polymyalgia rheumatica flare.  She has not had any increased difficulty raising her arms above her head or rising from a seated position.  She has been off of long-term prednisone since 2019 with no recurrence of symptoms.  She has occasional pain in both hands and both feet but denies any joint swelling.  She denies any other new medical conditions. Patient reports that she had an updated bone density this spring and will have results forwarded to our office.  Her last Prolia injection was administered on 06/27/2021.  She denies any recent falls or fractures.  She has been exercising about 6 hours a week in the pool.   Activities of Daily Living:  Patient reports morning stiffness for 0 minutes.   Patient Denies nocturnal pain.  Difficulty dressing/grooming: Denies Difficulty climbing stairs: Denies Difficulty getting out of chair: Denies Difficulty using hands for taps, buttons, cutlery, and/or writing: Denies  Review of Systems  Constitutional:  Negative for fatigue.  HENT:  Positive for  mouth dryness. Negative for mouth sores.   Eyes:  Negative for dryness.  Respiratory:  Negative for shortness of breath.   Cardiovascular:  Negative for chest pain and palpitations.  Gastrointestinal:  Negative for blood in stool, constipation and diarrhea.  Endocrine: Negative for increased urination.  Genitourinary:  Negative for involuntary urination.  Musculoskeletal:  Positive for joint pain, joint pain and joint swelling. Negative for myalgias, muscle weakness, morning stiffness, muscle tenderness and myalgias.  Skin:  Positive for color change and sensitivity to sunlight. Negative for rash and hair loss.  Allergic/Immunologic: Negative for susceptible to infections.  Neurological:  Positive for numbness. Negative for dizziness and headaches.  Hematological:  Negative for swollen glands.  Psychiatric/Behavioral:  Positive for sleep disturbance. Negative for depressed mood. The patient is not nervous/anxious.     PMFS History:  Patient Active Problem List   Diagnosis Date Noted   Dehydration 01/25/2019   Diverticulitis 01/25/2019   Hyponatremia 01/25/2019   History of humerus fracture 04/23/2016   History of gastroesophageal reflux (GERD) 04/23/2016   Positive ANA (antinuclear antibody) 04/23/2016   History of poliomyelitis 04/23/2016   Vitiligo 04/23/2016   Scoliosis 04/23/2016   Foreign body granuloma of skin and subcutaneous tissue 11/16/2015   At risk for falling 03/24/2015   Bronchitis 03/10/2014   Encounter for general adult medical examination without abnormal findings 02/13/2014   BMI (body mass index), pediatric, 5% to less than 85% for age 93/07/2013  Hypercholesterolemia 04/05/2013   Benign hypertension 04/05/2013   Polymyalgia rheumatica ([place]) 04/05/2013   Other specified health status 04/05/2013   Disease of skin and subcutaneous tissue 03/11/2013   OP (osteoporosis) 03/11/2013   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   Esophagitis, reflux  03/11/2013   Neuralgia neuritis, sciatic nerve 03/11/2013   Encounter for screening for eye and ear disorders 03/11/2013   MGUS (monoclonal gammopathy of unknown significance) 11/01/2011    Past Medical History:  Diagnosis Date   High cholesterol    Hypertension    Kyphosis    PMR (polymyalgia rheumatica) (HCC)    Polio    Scoliosis    Vitiligo     Family History  Problem Relation Age of Onset   Diabetes Mother    Cancer Brother    Past Surgical History:  Procedure Laterality Date   ABDOMINAL HYSTERECTOMY  1993   CATARACT EXTRACTION, BILATERAL Bilateral    09/13/2021, 09/27/2021   SHOULDER FUSION SURGERY Left    took a piece of hip and fused it into left shoulder   TOTAL SHOULDER ARTHROPLASTY     Social History   Social History Narrative   Not on file   Immunization History  Administered Date(s) Administered   Influenza,inj,Quad PF,6+ Mos 12/15/2006, 02/13/2009, 02/14/2010, 02/18/2011, 02/10/2015, 02/14/2016, 01/31/2017, 02/03/2018   PFIZER(Purple Top)SARS-COV-2 Vaccination 05/07/2019, 06/01/2019, 12/29/2019   Pneumococcal Conjugate-13 02/14/2012   Pneumococcal Polysaccharide-23 08/12/2014     Objective: Vital Signs: BP 132/64 (BP Location: Right Arm, Patient Position: Sitting, Cuff Size: Normal)   Pulse 69   Ht '5\' 1"'$  (1.549 m)   Wt 98 lb 3.2 oz (44.5 kg)   BMI 18.55 kg/m    Physical Exam Vitals and nursing note reviewed.  Constitutional:      Appearance: She is well-developed.  HENT:     Head: Normocephalic and atraumatic.  Eyes:     Conjunctiva/sclera: Conjunctivae normal.  Cardiovascular:     Rate and Rhythm: Normal rate and regular rhythm.     Heart sounds: Normal heart sounds.  Pulmonary:     Effort: Pulmonary effort is normal.     Breath sounds: Normal breath sounds.  Abdominal:     General: Bowel sounds are normal.     Palpations: Abdomen is soft.  Musculoskeletal:     Cervical back: Normal range of motion.  Skin:    General: Skin is warm and  dry.     Capillary Refill: Capillary refill takes less than 2 seconds.  Neurological:     Mental Status: She is alert and oriented to person, place, and time.  Psychiatric:        Behavior: Behavior normal.      Musculoskeletal Exam: C-spine has limited range of motion especially with lateral rotation.  Thoracolumbar scoliosis noted.  Limited range of motion of both shoulder joints noted.  Left elbow joint contracture noted.  Wrist joints have good range of motion with no tenderness or synovitis.  She has PIP and DIP thickening consistent with osteoarthritis of both hands.  Some tenderness over the right second, third, and fourth PIP joints.  Complete fist formation bilaterally.  Hip joints have good range of motion with no groin pain.  Knee joints have good range of motion with no warmth or effusion.  Ankle joints have good range of motion with no joint tenderness or synovitis.   CDAI Exam: CDAI Score: -- Patient Global: --; Provider Global: -- Swollen: --; Tender: -- Joint Exam 10/24/2021   No joint exam  has been documented for this visit   There is currently no information documented on the homunculus. Go to the Rheumatology activity and complete the homunculus joint exam.  Investigation: No additional findings.  Imaging: No results found.  Recent Labs: Lab Results  Component Value Date   WBC 4.2 06/14/2021   HGB 13.2 06/14/2021   PLT 190 06/14/2021   NA 133 (L) 06/14/2021   K 4.4 06/14/2021   CL 94 (L) 06/14/2021   CO2 30 06/14/2021   GLUCOSE 108 (H) 06/14/2021   BUN 15 06/14/2021   CREATININE 0.48 (L) 06/14/2021   BILITOT 0.5 06/14/2021   ALKPHOS 55 01/26/2019   AST 22 06/14/2021   ALT 15 06/14/2021   PROT 6.5 06/14/2021   ALBUMIN 3.9 01/26/2019   CALCIUM 9.7 06/14/2021   GFRAA 104 05/12/2020    Speciality Comments: PLQ eye exam: 02/12/2021 WNL @ [facility] Follow up in 1 year  Prolia: 06/20/16, 08/11/17, 02/06/18, 08/17/18, 02/17/19, 05/17/19, 11/22/19,  05/29/20  Procedures:  No procedures performed Allergies: Other and Tetracyclines & related   Assessment / Plan:     Visit Diagnoses: Polymyalgia rheumatica ([place]) - In remission.  Patient has been off of long-term prednisone since 2019 with no recurrence of symptoms.  She is not experiencing any signs or symptoms of a flare at this time.  She has been taking Plaquenil 200 mg 1 tablet by mouth every other day consistently since 2019.  She was previously on Plaquenil from 2013-2015.  She was evaluated by Dr. Ellie Lunch on 08/22/21 is sent to [place] correspondence stating that it might be difficult to spot proximal toxicity in the early stages due to the patient's known macular degeneration bilaterally.  Advised the patient to discontinue Plaquenil at this time.  Different treatment options were discussed including methotrexate.  She is apprehensive to start on methotrexate due to possible side effects.  She would like to monitor her symptoms closely off of Plaquenil for now.  She was advised to notify us if she develops signs or symptoms of a flare.  If she develops symptoms of a flare she was advised to have her sed rate checked upon symptom onset.  She will follow-up in the office in 3 months but will notify us if she needs to be seen sooner.  High risk medication use - Advised to discontinue Plaquenil 200 mg 1 tablet by mouth every other day.  Evaluated by Dr. Ellie Lunch at [facility] on 08/22/2021 patient has known macular degeneration bilaterally and bilateral vitreomacular traction makes it difficult to spot early stages of Plaquenil toxicity. Patient has declined starting methotrexate due to being apprehensive of potential side effects.  CBC and CMP were drawn on 06/14/2021.  Age-related osteoporosis without current pathological fracture -  DEXA: July 13, 2019 T score -2.2 left femur overall BMD improved. Last Prolia injection administered on-06/27/21.  Patient has had an updated bone  density and will have results forwarded to our office to review. No falls or fractures recently.   Vitamin D deficiency: She is taking vitamin D 1000 units daily.   History of humerus fracture  Primary osteoarthritis of both hands: She has PIP and DIP thickening consistent with osteoarthritis of both hands.  No signs of inflammatory arthritis at this time.  Tenderness over the right second, third, and fourth PIP joints but no synovitis was noted.  Complete fist formation bilaterally.  Discussed the importance of joint protection and muscle strengthening.  Primary osteoarthritis of both feet: She experiences intermittent  discomfort in her feet due to underlying osteoarthritis.  She has good range of motion of both ankle joints with no tenderness or joint swelling at this time.  DDD (degenerative disc disease), cervical: C-spine has limited range of motion especially with lateral rotation.  No symptoms of radiculopathy at this time.  Scoliosis: Thoracolumbar scoliosis noted.  Positive ANA (antinuclear antibody): No signs or symptoms of systemic lupus at this time.  Other fatigue: Stable.  She has been exercising in the pool 6 hours/week.  Other medical conditions are listed as follows:   Vitiligo  History of diverticulitis  History of gastroesophageal reflux (GERD)  History of poliomyelitis  Dyslipidemia  History of hypertension: BP was 132/64 today in the office.   MGUS (monoclonal gammopathy of unknown significance)  Orders: No orders of the defined types were placed in this encounter.  No orders of the defined types were placed in this encounter.     Follow-Up Instructions: Return in about 3 months (around 01/24/2022) for PMR, Osteoporosis, Osteoarthritis.   Ofilia Neas, PA-C  Note - This record has been created using Dragon software.  Chart creation errors have been sought, but may not always  have been located. Such creation errors do not reflect on  the  standard of medical care.

## 2021-10-24 ENCOUNTER — Ambulatory Visit: Payer: Medicare PPO | Admitting: Physician Assistant

## 2021-10-24 ENCOUNTER — Encounter: Payer: Self-pay | Admitting: Physician Assistant

## 2021-10-24 VITALS — BP 132/64 | HR 69 | Ht 61.0 in | Wt 98.2 lb

## 2021-10-24 DIAGNOSIS — M353 Polymyalgia rheumatica: Secondary | ICD-10-CM | POA: Diagnosis not present

## 2021-10-24 DIAGNOSIS — E785 Hyperlipidemia, unspecified: Secondary | ICD-10-CM

## 2021-10-24 DIAGNOSIS — R768 Other specified abnormal immunological findings in serum: Secondary | ICD-10-CM

## 2021-10-24 DIAGNOSIS — M810 Age-related osteoporosis without current pathological fracture: Secondary | ICD-10-CM | POA: Diagnosis not present

## 2021-10-24 DIAGNOSIS — M41125 Adolescent idiopathic scoliosis, thoracolumbar region: Secondary | ICD-10-CM

## 2021-10-24 DIAGNOSIS — Z8781 Personal history of (healed) traumatic fracture: Secondary | ICD-10-CM

## 2021-10-24 DIAGNOSIS — L80 Vitiligo: Secondary | ICD-10-CM

## 2021-10-24 DIAGNOSIS — Z8612 Personal history of poliomyelitis: Secondary | ICD-10-CM

## 2021-10-24 DIAGNOSIS — E559 Vitamin D deficiency, unspecified: Secondary | ICD-10-CM

## 2021-10-24 DIAGNOSIS — Z8719 Personal history of other diseases of the digestive system: Secondary | ICD-10-CM

## 2021-10-24 DIAGNOSIS — M19042 Primary osteoarthritis, left hand: Secondary | ICD-10-CM

## 2021-10-24 DIAGNOSIS — M5030 Other cervical disc degeneration, unspecified cervical region: Secondary | ICD-10-CM

## 2021-10-24 DIAGNOSIS — M19071 Primary osteoarthritis, right ankle and foot: Secondary | ICD-10-CM

## 2021-10-24 DIAGNOSIS — M19072 Primary osteoarthritis, left ankle and foot: Secondary | ICD-10-CM

## 2021-10-24 DIAGNOSIS — Z79899 Other long term (current) drug therapy: Secondary | ICD-10-CM

## 2021-10-24 DIAGNOSIS — M19041 Primary osteoarthritis, right hand: Secondary | ICD-10-CM

## 2021-10-24 DIAGNOSIS — R7689 Other specified abnormal immunological findings in serum: Secondary | ICD-10-CM

## 2021-10-24 DIAGNOSIS — Z8679 Personal history of other diseases of the circulatory system: Secondary | ICD-10-CM

## 2021-10-24 DIAGNOSIS — D472 Monoclonal gammopathy: Secondary | ICD-10-CM

## 2021-10-24 DIAGNOSIS — R5383 Other fatigue: Secondary | ICD-10-CM

## 2021-11-01 ENCOUNTER — Telehealth: Payer: Self-pay | Admitting: *Deleted

## 2021-11-01 NOTE — Telephone Encounter (Signed)
Patient advised continue calcium Vitamin D and resistive exercises.

## 2021-11-01 NOTE — Telephone Encounter (Signed)
Received DEXA results from [facility].  Date of Scan: 08/20/2021  Lowest T-score:-2.0  ZPH:1.505  Lowest site measured:Left Femoral Neck  DX: Osteopenia  Significant changes in BMD and site measured (5% and above):n/a  Current Regimen:Prolia (06/27/2021, last injection), Calcium, Vitamin D  Recommendation:Continue calcium Vitamin D and resistive exercises.    Reviewed by:Hazel Sams, PA-C  Next Appointment:  01/25/2022  Attempted to contact the patient and left message for patient to call the office.

## 2021-11-30 ENCOUNTER — Other Ambulatory Visit (HOSPITAL_COMMUNITY): Payer: Self-pay

## 2021-12-04 ENCOUNTER — Other Ambulatory Visit (HOSPITAL_COMMUNITY): Payer: Self-pay

## 2021-12-05 ENCOUNTER — Other Ambulatory Visit (HOSPITAL_COMMUNITY): Payer: Self-pay

## 2021-12-05 ENCOUNTER — Telehealth: Payer: Self-pay | Admitting: Pharmacist

## 2021-12-05 DIAGNOSIS — Z79899 Other long term (current) drug therapy: Secondary | ICD-10-CM

## 2021-12-05 DIAGNOSIS — M810 Age-related osteoporosis without current pathological fracture: Secondary | ICD-10-CM

## 2021-12-05 NOTE — Telephone Encounter (Signed)
Patient due for next Prolia on 12/24/21.  Copay through pharmacy benefit is $50.  ATC patient to review labwork needed. Left VM for pt with open lab hours. Future lab order for CBC and CMP placed today.  Knox Saliva, PharmD, MPH, BCPS, CPP Clinical Pharmacist (Rheumatology and Pulmonology)

## 2021-12-12 ENCOUNTER — Other Ambulatory Visit (HOSPITAL_COMMUNITY): Payer: Self-pay

## 2021-12-20 ENCOUNTER — Other Ambulatory Visit: Payer: Self-pay | Admitting: Rheumatology

## 2021-12-20 DIAGNOSIS — M353 Polymyalgia rheumatica: Secondary | ICD-10-CM

## 2021-12-20 NOTE — Telephone Encounter (Signed)
Per office note on 10/24/2021: Advised to discontinue Plaquenil 200 mg 1 tablet by mouth every other day.  Evaluated by Dr. Ellie Lunch at [facility] on 08/22/2021 patient has known macular degeneration bilaterally and bilateral vitreomacular traction makes it difficult to spot early stages of Plaquenil toxicity. Patient has declined starting methotrexate due to being apprehensive of potential side effects.

## 2021-12-21 ENCOUNTER — Other Ambulatory Visit: Payer: Self-pay | Admitting: *Deleted

## 2021-12-21 DIAGNOSIS — M810 Age-related osteoporosis without current pathological fracture: Secondary | ICD-10-CM

## 2021-12-21 DIAGNOSIS — Z79899 Other long term (current) drug therapy: Secondary | ICD-10-CM

## 2021-12-22 LAB — COMPREHENSIVE METABOLIC PANEL
AG Ratio: 2 (calc) (ref 1.0–2.5)
ALT: 16 U/L (ref 6–29)
AST: 25 U/L (ref 10–35)
Albumin: 4.6 g/dL (ref 3.6–5.1)
Alkaline phosphatase (APISO): 55 U/L (ref 37–153)
BUN/Creatinine Ratio: 44 (calc) — ABNORMAL HIGH (ref 6–22)
BUN: 21 mg/dL (ref 7–25)
CO2: 30 mmol/L (ref 20–32)
Calcium: 9.7 mg/dL (ref 8.6–10.4)
Chloride: 95 mmol/L — ABNORMAL LOW (ref 98–110)
Creat: 0.48 mg/dL — ABNORMAL LOW (ref 0.60–0.95)
Globulin: 2.3 g/dL (calc) (ref 1.9–3.7)
Glucose, Bld: 75 mg/dL (ref 65–99)
Potassium: 4.8 mmol/L (ref 3.5–5.3)
Sodium: 134 mmol/L — ABNORMAL LOW (ref 135–146)
Total Bilirubin: 0.7 mg/dL (ref 0.2–1.2)
Total Protein: 6.9 g/dL (ref 6.1–8.1)

## 2021-12-22 LAB — CBC WITH DIFFERENTIAL/PLATELET
Absolute Monocytes: 508 cells/uL (ref 200–950)
Basophils Absolute: 22 cells/uL (ref 0–200)
Basophils Relative: 0.4 %
Eosinophils Absolute: 38 cells/uL (ref 15–500)
Eosinophils Relative: 0.7 %
HCT: 42 % (ref 35.0–45.0)
Hemoglobin: 14.3 g/dL (ref 11.7–15.5)
Lymphs Abs: 1042 cells/uL (ref 850–3900)
MCH: 30.5 pg (ref 27.0–33.0)
MCHC: 34 g/dL (ref 32.0–36.0)
MCV: 89.6 fL (ref 80.0–100.0)
MPV: 9 fL (ref 7.5–12.5)
Monocytes Relative: 9.4 %
Neutro Abs: 3791 cells/uL (ref 1500–7800)
Neutrophils Relative %: 70.2 %
Platelets: 201 10*3/uL (ref 140–400)
RBC: 4.69 10*6/uL (ref 3.80–5.10)
RDW: 12.2 % (ref 11.0–15.0)
Total Lymphocyte: 19.3 %
WBC: 5.4 10*3/uL (ref 3.8–10.8)

## 2021-12-24 ENCOUNTER — Other Ambulatory Visit (HOSPITAL_COMMUNITY): Payer: Self-pay

## 2021-12-24 NOTE — Progress Notes (Signed)
Sodium and chloride are low and stable.  CBC is normal.

## 2021-12-24 NOTE — Telephone Encounter (Addendum)
Labs on 12/21/21 wnl. ATC patient to schedule Prolia visit. Unable to reach. Left VM requesting return call  Knox Saliva, PharmD, MPH, BCPS, CPP Clinical Pharmacist (Rheumatology and Pulmonology)

## 2021-12-31 ENCOUNTER — Other Ambulatory Visit (HOSPITAL_COMMUNITY): Payer: Self-pay

## 2021-12-31 MED ORDER — PROLIA 60 MG/ML ~~LOC~~ SOSY
60.0000 mg | PREFILLED_SYRINGE | SUBCUTANEOUS | 0 refills | Status: DC
  Filled 2021-12-31: qty 1, 180d supply, fill #0

## 2021-12-31 NOTE — Telephone Encounter (Signed)
Spoke with patient. She is comfortable with Prolia copay. Rx for Prolia sent to [facility] to be couriered to clinic by 01/07/22  Appt scheduled for 01/07/22  Knox Saliva, PharmD, MPH, BCPS, CPP Clinical Pharmacist (Rheumatology and Pulmonology)

## 2022-01-02 ENCOUNTER — Other Ambulatory Visit (HOSPITAL_COMMUNITY): Payer: Self-pay

## 2022-01-02 NOTE — Progress Notes (Signed)
Pharmacy Note  Subjective:   Patient presents to clinic today to receive bi-annual dose of Prolia. Patient's last dose of Prolia was on 06/27/21.  Patient running a fever or have signs/symptoms of infection? No  Patient currently on antibiotics for the treatment of infection? No  Patient had fall in the last 6 months? Yes. She fell twice but neither required medical attention. She fell stepping outside her home going down a step. A neighbor helped her up. She also fell going up the first step in her basement. She states that her right leg gave out during both events. She spoke with her orthopedist who has recommended additional strength training exercises focused on hamstrings and glutes. Her last fill since this was 2 years ago she recalls.  Patient taking calcium 1200 mg daily through diet or supplement and at least 800 units vitamin D? Yes  Objective: CMP     Component Value Date/Time   NA 134 (L) 12/21/2021 1054   NA 133 (L) 09/13/2013 1307   K 4.8 12/21/2021 1054   K 4.6 09/13/2013 1307   CL 95 (L) 12/21/2021 1054   CO2 30 12/21/2021 1054   CO2 28 09/13/2013 1307   GLUCOSE 75 12/21/2021 1054   GLUCOSE 74 09/13/2013 1307   BUN 21 12/21/2021 1054   BUN 13.3 09/13/2013 1307   CREATININE 0.48 (L) 12/21/2021 1054   CREATININE 0.7 09/13/2013 1307   CALCIUM 9.7 12/21/2021 1054   CALCIUM 9.4 09/13/2013 1307   PROT 6.9 12/21/2021 1054   PROT 7.4 09/13/2013 1307   ALBUMIN 3.9 01/26/2019 0546   ALBUMIN 3.9 09/13/2013 1307   AST 25 12/21/2021 1054   AST 17 09/13/2013 1307   ALT 16 12/21/2021 1054   ALT 12 09/13/2013 1307   ALKPHOS 55 01/26/2019 0546   ALKPHOS 69 09/13/2013 1307   BILITOT 0.7 12/21/2021 1054   BILITOT 0.58 09/13/2013 1307   GFRNONAA 90 05/12/2020 1216   GFRAA 104 05/12/2020 1216    CBC    Component Value Date/Time   WBC 5.4 12/21/2021 1054   RBC 4.69 12/21/2021 1054   HGB 14.3 12/21/2021 1054   HGB 13.7 09/13/2013 1307   HCT 42.0 12/21/2021 1054   HCT  42.9 09/13/2013 1307   PLT 201 12/21/2021 1054   PLT 239 09/13/2013 1307   MCV 89.6 12/21/2021 1054   MCV 91.0 09/13/2013 1307   MCH 30.5 12/21/2021 1054   MCHC 34.0 12/21/2021 1054   RDW 12.2 12/21/2021 1054   RDW 13.0 09/13/2013 1307   LYMPHSABS 1,042 12/21/2021 1054   LYMPHSABS 1.2 09/13/2013 1307   MONOABS 423 06/18/2016 1151   MONOABS 0.5 09/13/2013 1307   EOSABS 38 12/21/2021 1054   EOSABS 0.1 09/13/2013 1307   BASOSABS 22 12/21/2021 1054   BASOSABS 0.0 09/13/2013 1307    Lab Results  Component Value Date   VD25OH 43 06/14/2021   DEXA history 07/13/19 T score -2.2 left femur overall BMD improved. 08/20/21 T score -2.0 left femur - overall significant changes in BMD  Assessment/Plan:   Reviewed importance of adequate dietary intake of calcium in addition to supplementation due to risk of hypocalcemia with Prolia.   Patient tolerated injection without issue.   Administrations This Visit     denosumab (PROLIA) injection 60 mg     Admin Date 01/07/2022 Action Given Dose 60 mg Route Subcutaneous Administered By Cassandria Anger, RPH-CPP           Patient's next Prolia dose is due on  07/06/22.  Patient is due for updated DEXA in May 2025.   All questions encouraged and answered.  Instructed patient to call with any further questions or concerns.  Knox Saliva, PharmD, MPH, BCPS, CPP Clinical Pharmacist (Rheumatology and Pulmonology)

## 2022-01-03 ENCOUNTER — Other Ambulatory Visit (HOSPITAL_COMMUNITY): Payer: Self-pay

## 2022-01-03 NOTE — Telephone Encounter (Signed)
Prolia received from [facility]. Placed in refrigerator  Ziyah Cordoba, PharmD, MPH, BCPS, CPP Clinical Pharmacist (Rheumatology and Pulmonology) 

## 2022-01-07 ENCOUNTER — Ambulatory Visit: Payer: Medicare PPO | Attending: Rheumatology | Admitting: Pharmacist

## 2022-01-07 DIAGNOSIS — Z7689 Persons encountering health services in other specified circumstances: Secondary | ICD-10-CM

## 2022-01-07 DIAGNOSIS — M810 Age-related osteoporosis without current pathological fracture: Secondary | ICD-10-CM

## 2022-01-07 MED ORDER — DENOSUMAB 60 MG/ML ~~LOC~~ SOSY
60.0000 mg | PREFILLED_SYRINGE | Freq: Once | SUBCUTANEOUS | Status: AC
  Administered 2022-01-07: 60 mg via SUBCUTANEOUS

## 2022-01-11 NOTE — Progress Notes (Signed)
Office Visit Note  Patient: Ellen Thomas             Date of Birth: 04-May-1935           MRN: [id number]             PCP: Bernerd Limbo, MD Referring: Bernerd Limbo, MD Visit Date: 01/25/2022 Occupation: '@GUAROCC'$ @  Subjective:  Recent fall  History of Present Illness: Ellen Thomas is a 86 y.o. female with history of osteoporosis, polymyalgia rheumatica, and osteoarthritis.  Her PMR remains in remission.  She denies any signs or symptoms of a polymyalgia rheumatica flare.  She has not noticed any new or worsening symptoms since discontinuing Plaquenil as advised by her ophthalmologist.  She denies any joint swelling at this time.  Patient reports that at the end of August 2023 she had a fall at which time her knee buckled.  She was evaluated at [facility] and was referred for physical therapy.  She has been going to physical therapy twice a week and will be completing physical therapy by 02/08/2022.  She has noticed improvement in her lower extremity muscle strength.  She states she is also increased her exercise regimen and has been going to water aerobics 4 days a week and sometimes does 2 classes in a row on some days.   She remains on prolia 60 mg sq injections every 6 months for management of osteoporosis.  Her most recent dose of prolia was administered on 01/07/22.  She tolerates Prolia without any side effects.  She continues to take vitamin D 2000 units daily.  She denies any recent or recurrent infections.  She has had the annual flu shot, COVID booster, and RSV vaccine.    Activities of Daily Living:  Patient reports morning stiffness for 0 minutes.   Patient Denies nocturnal pain.  Difficulty dressing/grooming: Denies Difficulty climbing stairs: Denies Difficulty getting out of chair: Denies Difficulty using hands for taps, buttons, cutlery, and/or writing: Denies  Review of Systems  Constitutional:  Negative for fatigue.  HENT:  Negative for mouth  sores and mouth dryness.   Eyes:  Positive for dryness.  Respiratory:  Negative for shortness of breath.   Cardiovascular:  Negative for chest pain and palpitations.  Gastrointestinal:  Positive for constipation. Negative for blood in stool and diarrhea.  Endocrine: Negative for increased urination.  Genitourinary:  Negative for involuntary urination.  Musculoskeletal:  Negative for joint pain, gait problem, joint pain, joint swelling, myalgias, muscle weakness, morning stiffness, muscle tenderness and myalgias.  Skin:  Positive for color change. Negative for rash, hair loss and sensitivity to sunlight.  Allergic/Immunologic: Negative for susceptible to infections.  Neurological:  Positive for numbness. Negative for dizziness and headaches.  Hematological:  Negative for swollen glands.  Psychiatric/Behavioral:  Negative for depressed mood and sleep disturbance. The patient is not nervous/anxious.     PMFS History:  Patient Active Problem List   Diagnosis Date Noted   Dehydration 01/25/2019   Diverticulitis 01/25/2019   Hyponatremia 01/25/2019   History of humerus fracture 04/23/2016   History of gastroesophageal reflux (GERD) 04/23/2016   Positive ANA (antinuclear antibody) 04/23/2016   History of poliomyelitis 04/23/2016   Vitiligo 04/23/2016   Scoliosis 04/23/2016   Foreign body granuloma of skin and subcutaneous tissue 11/16/2015   At risk for falling 03/24/2015   Bronchitis 03/10/2014   Encounter for general adult medical examination without abnormal findings 02/13/2014   BMI (body mass index), pediatric, 5% to less than 85%  for age 26/07/2013   Hypercholesterolemia 04/05/2013   Benign hypertension 04/05/2013   Polymyalgia rheumatica ([place]) 04/05/2013   Other specified health status 04/05/2013   Disease of skin and subcutaneous tissue 03/11/2013   OP (osteoporosis) 03/11/2013   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/2013   Esophagitis, reflux 03/11/2013    Neuralgia neuritis, sciatic nerve 03/11/2013   Encounter for screening for eye and ear disorders 03/11/2013   MGUS (monoclonal gammopathy of unknown significance) 11/01/2011    Past Medical History:  Diagnosis Date   High cholesterol    Hypertension    Kyphosis    PMR (polymyalgia rheumatica) (HCC)    Polio    Scoliosis    Vitiligo     Family History  Problem Relation Age of Onset   Diabetes Mother    Cancer Brother    Past Surgical History:  Procedure Laterality Date   ABDOMINAL HYSTERECTOMY  1993   CATARACT EXTRACTION, BILATERAL Bilateral    09/13/2021, 09/27/2021   SHOULDER FUSION SURGERY Left    took a piece of hip and fused it into left shoulder   TOTAL SHOULDER ARTHROPLASTY     Social History   Social History Narrative   Not on file   Immunization History  Administered Date(s) Administered   Influenza,inj,Quad PF,6+ Mos 12/15/2006, 02/13/2009, 02/14/2010, 02/18/2011, 02/10/2015, 02/14/2016, 01/31/2017, 02/03/2018   PFIZER(Purple Top)SARS-COV-2 Vaccination 05/07/2019, 06/01/2019, 12/29/2019   Pneumococcal Conjugate-13 02/14/2012   Pneumococcal Polysaccharide-23 08/12/2014     Objective: Vital Signs: BP (!) 148/65 (BP Location: Right Arm, Patient Position: Sitting, Cuff Size: Normal)   Pulse 71   Resp 14   Ht 5' (1.524 m)   Wt 99 lb (44.9 kg)   BMI 19.33 kg/m    Physical Exam Vitals and nursing note reviewed.  Constitutional:      Appearance: She is well-developed.  HENT:     Head: Normocephalic and atraumatic.  Eyes:     Conjunctiva/sclera: Conjunctivae normal.  Cardiovascular:     Rate and Rhythm: Normal rate and regular rhythm.     Heart sounds: Normal heart sounds.  Pulmonary:     Effort: Pulmonary effort is normal.     Breath sounds: Normal breath sounds.  Abdominal:     General: Bowel sounds are normal.     Palpations: Abdomen is soft.  Musculoskeletal:     Cervical back: Normal range of motion.  Skin:    General: Skin is warm and dry.      Capillary Refill: Capillary refill takes less than 2 seconds.  Neurological:     Mental Status: She is alert and oriented to person, place, and time.  Psychiatric:        Behavior: Behavior normal.      Musculoskeletal Exam: C-spine has limited range of motion especially with lateral rotation.  Thoracolumbar scoliosis.  Limited range of motion of both shoulders.  Left elbow joint contracture.  Wrist joints have good range of motion with no tenderness or synovitis.  PIP and DIP thickening consistent with osteoarthritis of both hands, right hand more severe than left.  Complete fist formation bilaterally.  Hip joints have good range of motion with no groin pain.  Knee joints have good range of motion with no warmth or effusion.  Ankle joints have good range of motion with no tenderness or joint swelling.  CDAI Exam: CDAI Score: -- Patient Global: --; Provider Global: -- Swollen: --; Tender: -- Joint Exam 01/25/2022   No joint exam has been documented for this visit  There is currently no information documented on the homunculus. Go to the Rheumatology activity and complete the homunculus joint exam.  Investigation: No additional findings.  Imaging: No results found.  Recent Labs: Lab Results  Component Value Date   WBC 5.4 12/21/2021   HGB 14.3 12/21/2021   PLT 201 12/21/2021   NA 134 (L) 12/21/2021   K 4.8 12/21/2021   CL 95 (L) 12/21/2021   CO2 30 12/21/2021   GLUCOSE 75 12/21/2021   BUN 21 12/21/2021   CREATININE 0.48 (L) 12/21/2021   BILITOT 0.7 12/21/2021   ALKPHOS 55 01/26/2019   AST 25 12/21/2021   ALT 16 12/21/2021   PROT 6.9 12/21/2021   ALBUMIN 3.9 01/26/2019   CALCIUM 9.7 12/21/2021   GFRAA 104 05/12/2020    Speciality Comments: PLQ eye exam: 02/12/2021 WNL @  Opthamology Follow up in 1 year  Prolia: 06/20/16, 08/11/17, 02/06/18, 08/17/18, 02/17/19, 05/17/19, 11/22/19, 05/29/20, 12/11/20, 06/27/21, 01/07/22  Procedures:  No procedures  performed Allergies: Other and Tetracyclines & related   Assessment / Plan:     Visit Diagnoses: Age-related osteoporosis without current pathological fracture -Previous DEXA: July 13, 2019 T score -2.2 left femur overall BMD improved.  Updated DEXA 08/20/2021: Left femoral neck BMD 0.622 with T score -2.0.  Statistically significant increase in BMD of the lumbar spine and left hip.  The right hip and third distal right radius are stable.  Discussed DEXA results with the patient today in the office. Plan to remain on Prolia 60 mg biannual injections.  Her most recent injection was administered on 01/07/2022.  She continues to tolerate Prolia without any side effects.  She is taking vitamin D 2000 units daily.   She had a recent fall in August 2023 and is currently going to physical therapy twice a week for lower extremity muscle strengthening and fall prevention.  She is also increased her exercise regimen and has been going to water aerobics at least 4 days a week and sometimes does 2 classes per day. Discussed the importance of performing resistive exercises.  She plans on continuing to use resistive bands for strengthening. She will follow-up in the office in 5 months or sooner if needed.  Vitamin D deficiency: She is taking vitamin D 2000 units daily.  Polymyalgia rheumatica (HCC) - In remission. Patient has been off of long-term prednisone since 2019 with no recurrence of symptoms.  She has not had any signs or symptoms of a polymyalgia rheumatica flare.  She has not noticed any new or worsening symptoms since discontinuing Plaquenil.  High risk medication use - Advised to discontinue Plaquenil 200 mg 1 tablet by mouth every other day.  Evaluated by Dr. Ellie Lunch at [facility] on 08/22/2021 patient has known macular degeneration bilaterally and bilateral vitreomacular traction makes it difficult to spot early stages of Plaquenil toxicity.  Primary osteoarthritis of both  hands: She has PIP and DIP thickening consistent with osteoarthritis of both hands.  Osteoarthritic changes are more severe in the right hand than left.  No tenderness or synovitis noted.  Complete fist formation bilaterally.  Primary osteoarthritis of both feet: Hammertoes.  Good range of motion of both ankle joints with no tenderness or synovitis.  She is wearing proper fitting shoes.  DDD (degenerative disc disease), cervical: C-spine has limited range of motion especially with lateral rotation.  Scoliosis: Thoracolumbar scoliosis.  Positive ANA (antinuclear antibody): No clinical features of systemic lupus at this time.  Other medical conditions are listed as follows:  History of humerus fracture  Other fatigue  Vitiligo  History of diverticulitis  History of gastroesophageal reflux (GERD)  History of poliomyelitis  Dyslipidemia  History of hypertension: Blood pressure was 148/65 today in the office.  MGUS (monoclonal gammopathy of unknown significance)  Orders: No orders of the defined types were placed in this encounter.  No orders of the defined types were placed in this encounter.     Follow-Up Instructions: Return in about 5 months (around 06/26/2022) for Osteoporosis, Polymyalgia Rheumatica, Osteoarthritis.   Ofilia Neas, PA-C  Note - This record has been created using Dragon software.  Chart creation errors have been sought, but may not always  have been located. Such creation errors do not reflect on  the standard of medical care.

## 2022-01-25 ENCOUNTER — Ambulatory Visit: Payer: Medicare PPO | Attending: Physician Assistant | Admitting: Physician Assistant

## 2022-01-25 ENCOUNTER — Encounter: Payer: Self-pay | Admitting: Physician Assistant

## 2022-01-25 VITALS — BP 148/65 | HR 71 | Resp 14 | Ht 60.0 in | Wt 99.0 lb

## 2022-01-25 DIAGNOSIS — M19071 Primary osteoarthritis, right ankle and foot: Secondary | ICD-10-CM

## 2022-01-25 DIAGNOSIS — Z8612 Personal history of poliomyelitis: Secondary | ICD-10-CM

## 2022-01-25 DIAGNOSIS — L80 Vitiligo: Secondary | ICD-10-CM

## 2022-01-25 DIAGNOSIS — M5030 Other cervical disc degeneration, unspecified cervical region: Secondary | ICD-10-CM

## 2022-01-25 DIAGNOSIS — M353 Polymyalgia rheumatica: Secondary | ICD-10-CM

## 2022-01-25 DIAGNOSIS — D472 Monoclonal gammopathy: Secondary | ICD-10-CM

## 2022-01-25 DIAGNOSIS — E559 Vitamin D deficiency, unspecified: Secondary | ICD-10-CM | POA: Diagnosis not present

## 2022-01-25 DIAGNOSIS — Z8719 Personal history of other diseases of the digestive system: Secondary | ICD-10-CM

## 2022-01-25 DIAGNOSIS — M19042 Primary osteoarthritis, left hand: Secondary | ICD-10-CM

## 2022-01-25 DIAGNOSIS — M810 Age-related osteoporosis without current pathological fracture: Secondary | ICD-10-CM

## 2022-01-25 DIAGNOSIS — M41125 Adolescent idiopathic scoliosis, thoracolumbar region: Secondary | ICD-10-CM

## 2022-01-25 DIAGNOSIS — M19041 Primary osteoarthritis, right hand: Secondary | ICD-10-CM

## 2022-01-25 DIAGNOSIS — R5383 Other fatigue: Secondary | ICD-10-CM

## 2022-01-25 DIAGNOSIS — Z8781 Personal history of (healed) traumatic fracture: Secondary | ICD-10-CM

## 2022-01-25 DIAGNOSIS — Z8679 Personal history of other diseases of the circulatory system: Secondary | ICD-10-CM

## 2022-01-25 DIAGNOSIS — Z79899 Other long term (current) drug therapy: Secondary | ICD-10-CM | POA: Diagnosis not present

## 2022-01-25 DIAGNOSIS — M19072 Primary osteoarthritis, left ankle and foot: Secondary | ICD-10-CM

## 2022-01-25 DIAGNOSIS — R768 Other specified abnormal immunological findings in serum: Secondary | ICD-10-CM

## 2022-01-25 DIAGNOSIS — E785 Hyperlipidemia, unspecified: Secondary | ICD-10-CM

## 2022-06-27 ENCOUNTER — Other Ambulatory Visit (HOSPITAL_COMMUNITY): Payer: Self-pay

## 2022-07-02 ENCOUNTER — Telehealth: Payer: Self-pay | Admitting: Pharmacist

## 2022-07-02 ENCOUNTER — Other Ambulatory Visit (HOSPITAL_COMMUNITY): Payer: Self-pay

## 2022-07-02 DIAGNOSIS — M810 Age-related osteoporosis without current pathological fracture: Secondary | ICD-10-CM

## 2022-07-02 DIAGNOSIS — Z79899 Other long term (current) drug therapy: Secondary | ICD-10-CM

## 2022-07-02 DIAGNOSIS — E559 Vitamin D deficiency, unspecified: Secondary | ICD-10-CM

## 2022-07-02 NOTE — Telephone Encounter (Signed)
Patient due for next Prolia on 07/06/22. Per test claim, copay for Prolia is $50.  Future order for CBC, CMP, Vitamin D placed today.  MyChart message sent to patient regarding labs.  Knox Saliva, PharmD, MPH, BCPS, CPP Clinical Pharmacist (Rheumatology and Pulmonology)

## 2022-07-04 ENCOUNTER — Other Ambulatory Visit: Payer: Self-pay | Admitting: *Deleted

## 2022-07-04 DIAGNOSIS — Z79899 Other long term (current) drug therapy: Secondary | ICD-10-CM

## 2022-07-04 DIAGNOSIS — M810 Age-related osteoporosis without current pathological fracture: Secondary | ICD-10-CM

## 2022-07-04 DIAGNOSIS — E559 Vitamin D deficiency, unspecified: Secondary | ICD-10-CM

## 2022-07-05 ENCOUNTER — Other Ambulatory Visit (HOSPITAL_COMMUNITY): Payer: Self-pay

## 2022-07-05 ENCOUNTER — Other Ambulatory Visit: Payer: Self-pay

## 2022-07-05 LAB — CBC WITH DIFFERENTIAL/PLATELET
Absolute Monocytes: 387 cells/uL (ref 200–950)
Basophils Absolute: 18 cells/uL (ref 0–200)
Basophils Relative: 0.4 %
Eosinophils Absolute: 41 cells/uL (ref 15–500)
Eosinophils Relative: 0.9 %
HCT: 41.6 % (ref 35.0–45.0)
Hemoglobin: 13.8 g/dL (ref 11.7–15.5)
Lymphs Abs: 742.5 cells/uL — ABNORMAL LOW (ref 850–3900)
MCH: 30.1 pg (ref 27.0–33.0)
MCHC: 33.2 g/dL (ref 32.0–36.0)
MCV: 90.8 fL (ref 80.0–100.0)
MPV: 9.1 fL (ref 7.5–12.5)
Monocytes Relative: 8.6 %
Neutro Abs: 3312 cells/uL (ref 1500–7800)
Neutrophils Relative %: 73.6 %
Platelets: 193 10*3/uL (ref 140–400)
RBC: 4.58 10*6/uL (ref 3.80–5.10)
RDW: 11.5 % (ref 11.0–15.0)
Total Lymphocyte: 16.5 %
WBC: 4.5 10*3/uL (ref 3.8–10.8)

## 2022-07-05 LAB — COMPREHENSIVE METABOLIC PANEL
AG Ratio: 1.8 (calc) (ref 1.0–2.5)
ALT: 13 U/L (ref 6–29)
AST: 19 U/L (ref 10–35)
Albumin: 4.3 g/dL (ref 3.6–5.1)
Alkaline phosphatase (APISO): 53 U/L (ref 37–153)
BUN/Creatinine Ratio: 43 (calc) — ABNORMAL HIGH (ref 6–22)
BUN: 20 mg/dL (ref 7–25)
CO2: 31 mmol/L (ref 20–32)
Calcium: 9.7 mg/dL (ref 8.6–10.4)
Chloride: 94 mmol/L — ABNORMAL LOW (ref 98–110)
Creat: 0.46 mg/dL — ABNORMAL LOW (ref 0.60–0.95)
Globulin: 2.4 g/dL (calc) (ref 1.9–3.7)
Glucose, Bld: 142 mg/dL — ABNORMAL HIGH (ref 65–99)
Potassium: 4.8 mmol/L (ref 3.5–5.3)
Sodium: 132 mmol/L — ABNORMAL LOW (ref 135–146)
Total Bilirubin: 0.5 mg/dL (ref 0.2–1.2)
Total Protein: 6.7 g/dL (ref 6.1–8.1)

## 2022-07-05 LAB — VITAMIN D 25 HYDROXY (VIT D DEFICIENCY, FRACTURES): Vit D, 25-Hydroxy: 47 ng/mL (ref 30–100)

## 2022-07-05 MED ORDER — PROLIA 60 MG/ML ~~LOC~~ SOSY
60.0000 mg | PREFILLED_SYRINGE | SUBCUTANEOUS | 0 refills | Status: DC
  Filled 2022-07-05: qty 1, 180d supply, fill #0

## 2022-07-05 NOTE — Progress Notes (Signed)
Sodium and chloride are low and stable.  Glucose is elevated, probably not a fasting sample.  CBC is normal.  Vitamin D is 47 which is in the desirable range.

## 2022-07-05 NOTE — Telephone Encounter (Signed)
Rx for Prolia sent to [facility] ATC patient to schedule Prolia appt. Unable to reach.   Chesley Mires, PharmD, MPH, BCPS, CPP Clinical Pharmacist (Rheumatology and Pulmonology)

## 2022-07-08 NOTE — Telephone Encounter (Signed)
ATC patient to schedule Prolia appt. Unable to reach. Left VM requesting return call. If pt returns call, can scheduled for on or after 07/11/22  Chesley Mires, PharmD, MPH, BCPS, CPP Clinical Pharmacist (Rheumatology and Pulmonology)

## 2022-07-09 NOTE — Telephone Encounter (Signed)
Prolia received from [facility]. Placed in fridge  Prolia appt scheduled for 07/10/22  Chesley Mires, PharmD, MPH, BCPS, CPP Clinical Pharmacist (Rheumatology and Pulmonology)

## 2022-07-10 ENCOUNTER — Ambulatory Visit: Payer: Medicare PPO | Attending: Rheumatology | Admitting: Pharmacist

## 2022-07-10 VITALS — BP 161/70 | HR 65

## 2022-07-10 DIAGNOSIS — Z7689 Persons encountering health services in other specified circumstances: Secondary | ICD-10-CM

## 2022-07-10 DIAGNOSIS — M810 Age-related osteoporosis without current pathological fracture: Secondary | ICD-10-CM | POA: Diagnosis not present

## 2022-07-10 MED ORDER — DENOSUMAB 60 MG/ML ~~LOC~~ SOSY
60.0000 mg | PREFILLED_SYRINGE | Freq: Once | SUBCUTANEOUS | Status: AC
Start: 2022-07-10 — End: 2022-07-10
  Administered 2022-07-10: 60 mg via SUBCUTANEOUS

## 2022-07-10 NOTE — Progress Notes (Signed)
Pharmacy Note  Subjective:   Patient presents to clinic today to receive bi-annual dose of Prolia. Patient's last dose of Prolia was on 01/07/2022.   Patient running a fever or have signs/symptoms of infection? No  Patient currently on antibiotics for the treatment of infection? No  Patient had fall in the last 6 months?  No. She does water aerobics almost every morning at [facility]  Patient taking calcium 1200 mg daily through diet or supplement and at least 800 units vitamin D? Yes  Objective: CMP     Component Value Date/Time   NA 132 (L) 07/04/2022 1544   NA 133 (L) 09/13/2013 1307   K 4.8 07/04/2022 1544   K 4.6 09/13/2013 1307   CL 94 (L) 07/04/2022 1544   CO2 31 07/04/2022 1544   CO2 28 09/13/2013 1307   GLUCOSE 142 (H) 07/04/2022 1544   GLUCOSE 74 09/13/2013 1307   BUN 20 07/04/2022 1544   BUN 13.3 09/13/2013 1307   CREATININE 0.46 (L) 07/04/2022 1544   CREATININE 0.7 09/13/2013 1307   CALCIUM 9.7 07/04/2022 1544   CALCIUM 9.4 09/13/2013 1307   PROT 6.7 07/04/2022 1544   PROT 7.4 09/13/2013 1307   ALBUMIN 3.9 01/26/2019 0546   ALBUMIN 3.9 09/13/2013 1307   AST 19 07/04/2022 1544   AST 17 09/13/2013 1307   ALT 13 07/04/2022 1544   ALT 12 09/13/2013 1307   ALKPHOS 55 01/26/2019 0546   ALKPHOS 69 09/13/2013 1307   BILITOT 0.5 07/04/2022 1544   BILITOT 0.58 09/13/2013 1307   GFRNONAA 90 05/12/2020 1216   GFRAA 104 05/12/2020 1216    CBC    Component Value Date/Time   WBC 4.5 07/04/2022 1544   RBC 4.58 07/04/2022 1544   HGB 13.8 07/04/2022 1544   HGB 13.7 09/13/2013 1307   HCT 41.6 07/04/2022 1544   HCT 42.9 09/13/2013 1307   PLT 193 07/04/2022 1544   PLT 239 09/13/2013 1307   MCV 90.8 07/04/2022 1544   MCV 91.0 09/13/2013 1307   MCH 30.1 07/04/2022 1544   MCHC 33.2 07/04/2022 1544   RDW 11.5 07/04/2022 1544   RDW 13.0 09/13/2013 1307   LYMPHSABS 742.5 (L) 07/04/2022 1544   LYMPHSABS 1.2 09/13/2013 1307   MONOABS 423 06/18/2016 1151   MONOABS 0.5  09/13/2013 1307   EOSABS 41 07/04/2022 1544   EOSABS 0.1 09/13/2013 1307   BASOSABS 18 07/04/2022 1544   BASOSABS 0.0 09/13/2013 1307    Lab Results  Component Value Date   VD25OH 47 07/04/2022   Previous DEXA: July 13, 2019 T score -2.2 left femur overall BMD improved.  Updated DEXA 08/20/2021: Left femoral neck BMD 0.622 with T score -2.0.  Statistically significant increase in BMD of the lumbar spine and left hip.  The right hip and third distal right radius are stable.   Assessment/Plan:   Reviewed importance of adequate dietary intake of calcium in addition to supplementation due to risk of hypocalcemia with Prolia.   Patient tolerated injection without issue.   Administrations This Visit     denosumab (PROLIA) injection 60 mg     Admin Date 07/10/2022 Action Given Dose 60 mg Route Subcutaneous Administered By Murrell Redden, RPH-CPP           Patient's next Prolia dose is due on 01/06/2023.  Patient is due for updated DEXA in May 2025.   All questions encouraged and answered.  Instructed patient to call with any further questions or concerns.  Chesley Mires,  PharmD, MPH, BCPS, CPP Clinical Pharmacist (Rheumatology and Pulmonology)

## 2022-07-18 NOTE — Progress Notes (Deleted)
Office Visit Note  Patient: Ellen Thomas             Date of Birth: [birth year]           MRN: [id number]             PCP: Tracey Harries, MD Referring: Tracey Harries, MD Visit Date: 08/01/2022 Occupation: @  Subjective:  No chief complaint on file.   History of Present Illness: Ellen Thomas is a 87 y.o. female ***     Activities of Daily Living:  Patient reports morning stiffness for *** {minute/hour:19697}.   Patient {ACTIONS;DENIES/REPORTS:21021675::"Denies"} nocturnal pain.  Difficulty dressing/grooming: {ACTIONS;DENIES/REPORTS:21021675::"Denies"} Difficulty climbing stairs: {ACTIONS;DENIES/REPORTS:21021675::"Denies"} Difficulty getting out of chair: {ACTIONS;DENIES/REPORTS:21021675::"Denies"} Difficulty using hands for taps, buttons, cutlery, and/or writing: {ACTIONS;DENIES/REPORTS:21021675::"Denies"}  No Rheumatology ROS completed.   PMFS History:  Patient Active Problem List   Diagnosis Date Noted   Dehydration 01/25/2019   Diverticulitis 01/25/2019   Hyponatremia 01/25/2019   History of humerus fracture 04/23/2016   History of gastroesophageal reflux (GERD) 04/23/2016   Positive ANA (antinuclear antibody) 04/23/2016   History of poliomyelitis 04/23/2016   Vitiligo 04/23/2016   Scoliosis 04/23/2016   Foreign body granuloma of skin and subcutaneous tissue 11/16/2015   At risk for falling 03/24/2015   Bronchitis 03/10/2014   Encounter for general adult medical examination without abnormal findings 02/13/2014   BMI (body mass index), pediatric, 5% to less than 85% for age 76/07/2013   Hypercholesterolemia 04/05/2013   Benign hypertension 04/05/2013   Polymyalgia rheumatica 04/05/2013   Other specified health status 04/05/2013   Disease of skin and subcutaneous tissue 03/11/2013   OP (osteoporosis) 03/11/2013   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   Esophagitis, reflux 03/11/2013   Neuralgia neuritis, sciatic nerve 03/11/2013    Encounter for screening for eye and ear disorders 03/11/2013   MGUS (monoclonal gammopathy of unknown significance) 11/01/2011    Past Medical History:  Diagnosis Date   High cholesterol    Hypertension    Kyphosis    PMR (polymyalgia rheumatica) (HCC)    Polio    Scoliosis    Vitiligo     Family History  Problem Relation Age of Onset   Diabetes Mother    Cancer Brother    Past Surgical History:  Procedure Laterality Date   ABDOMINAL HYSTERECTOMY  1993   CATARACT EXTRACTION, BILATERAL Bilateral    09/13/2021, 09/27/2021   SHOULDER FUSION SURGERY Left    took a piece of hip and fused it into left shoulder   TOTAL SHOULDER ARTHROPLASTY     Social History   Social History Narrative   Not on file   Immunization History  Administered Date(s) Administered   COVID-19, mRNA, vaccine(Comirnaty)12 years and older 01/11/2022   Influenza,inj,Quad PF,6+ Mos 12/15/2006, 02/13/2009, 02/14/2010, 02/18/2011, 02/10/2015, 02/14/2016, 01/31/2017, 02/03/2018   PFIZER(Purple Top)SARS-COV-2 Vaccination 05/07/2019, 06/01/2019, 12/29/2019   Pneumococcal Conjugate-13 02/14/2012   Pneumococcal Polysaccharide-23 08/12/2014     Objective: Vital Signs: There were no vitals taken for this visit.   Physical Exam   Musculoskeletal Exam: ***  CDAI Exam: CDAI Score: -- Patient Global: --; Provider Global: -- Swollen: --; Tender: -- Joint Exam 08/01/2022   No joint exam has been documented for this visit   There is currently no information documented on the homunculus. Go to the Rheumatology activity and complete the homunculus joint exam.  Investigation: No additional findings.  Imaging: No results found.  Recent Labs: Lab Results  Component Value Date   WBC 4.5 07/04/2022  HGB 13.8 07/04/2022   PLT 193 07/04/2022   NA 132 (L) 07/04/2022   K 4.8 07/04/2022   CL 94 (L) 07/04/2022   CO2 31 07/04/2022   GLUCOSE 142 (H) 07/04/2022   BUN 20 07/04/2022   CREATININE 0.46 (L)  07/04/2022   BILITOT 0.5 07/04/2022   ALKPHOS 55 01/26/2019   AST 19 07/04/2022   ALT 13 07/04/2022   PROT 6.7 07/04/2022   ALBUMIN 3.9 01/26/2019   CALCIUM 9.7 07/04/2022   GFRAA 104 05/12/2020    Speciality Comments: PLQ eye exam: 02/12/2021 WNL @  Opthamology Follow up in 1 year  Prolia: 06/20/16, 08/11/17, 02/06/18, 08/17/18, 02/17/19, 05/17/19, 11/22/19, 05/29/20, 12/11/20, 06/27/21, 01/07/22  Procedures:  No procedures performed Allergies: Other and Tetracyclines & related   Assessment / Plan:     Visit Diagnoses: No diagnosis found.  Orders: No orders of the defined types were placed in this encounter.  No orders of the defined types were placed in this encounter.   Face-to-face time spent with patient was *** minutes. Greater than 50% of time was spent in counseling and coordination of care.  Follow-Up Instructions: No follow-ups on file.   Ellen Henri, CMA  Note - This record has been created using Animal nutritionist.  Chart creation errors have been sought, but may not always  have been located. Such creation errors do not reflect on  the standard of medical care.

## 2022-07-23 ENCOUNTER — Ambulatory Visit: Payer: Medicare PPO | Admitting: Rheumatology

## 2022-07-24 NOTE — Progress Notes (Unsigned)
Office Visit Note  Patient: Ellen Thomas             Date of Birth: [birth year]           MRN: [id number]             PCP: Tracey Harries, MD Referring: Tracey Harries, MD Visit Date: 07/25/2022 Occupation: @  Subjective:  No chief complaint on file.   History of Present Illness: Ellen Thomas is a 87 y.o. female ***     Activities of Daily Living:  Patient reports morning stiffness for *** {minute/hour:19697}.   Patient {ACTIONS;DENIES/REPORTS:21021675::"Denies"} nocturnal pain.  Difficulty dressing/grooming: {ACTIONS;DENIES/REPORTS:21021675::"Denies"} Difficulty climbing stairs: {ACTIONS;DENIES/REPORTS:21021675::"Denies"} Difficulty getting out of chair: {ACTIONS;DENIES/REPORTS:21021675::"Denies"} Difficulty using hands for taps, buttons, cutlery, and/or writing: {ACTIONS;DENIES/REPORTS:21021675::"Denies"}  No Rheumatology ROS completed.   PMFS History:  Patient Active Problem List   Diagnosis Date Noted   Dehydration 01/25/2019   Diverticulitis 01/25/2019   Hyponatremia 01/25/2019   History of humerus fracture 04/23/2016   History of gastroesophageal reflux (GERD) 04/23/2016   Positive ANA (antinuclear antibody) 04/23/2016   History of poliomyelitis 04/23/2016   Vitiligo 04/23/2016   Scoliosis 04/23/2016   Foreign body granuloma of skin and subcutaneous tissue 11/16/2015   At risk for falling 03/24/2015   Bronchitis 03/10/2014   Encounter for general adult medical examination without abnormal findings 02/13/2014   BMI (body mass index), pediatric, 5% to less than 85% for age 66/07/2013   Hypercholesterolemia 04/05/2013   Benign hypertension 04/05/2013   Polymyalgia rheumatica 04/05/2013   Other specified health status 04/05/2013   Disease of skin and subcutaneous tissue 03/11/2013   OP (osteoporosis) 03/11/2013   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   Esophagitis, reflux 03/11/2013   Neuralgia neuritis, sciatic nerve 03/11/2013    Encounter for screening for eye and ear disorders 03/11/2013   MGUS (monoclonal gammopathy of unknown significance) 11/01/2011    Past Medical History:  Diagnosis Date   High cholesterol    Hypertension    Kyphosis    PMR (polymyalgia rheumatica) (HCC)    Polio    Scoliosis    Vitiligo     Family History  Problem Relation Age of Onset   Diabetes Mother    Cancer Brother    Past Surgical History:  Procedure Laterality Date   ABDOMINAL HYSTERECTOMY  1993   CATARACT EXTRACTION, BILATERAL Bilateral    09/13/2021, 09/27/2021   SHOULDER FUSION SURGERY Left    took a piece of hip and fused it into left shoulder   TOTAL SHOULDER ARTHROPLASTY     Social History   Social History Narrative   Not on file   Immunization History  Administered Date(s) Administered   COVID-19, mRNA, vaccine(Comirnaty)12 years and older 01/11/2022   Influenza,inj,Quad PF,6+ Mos 12/15/2006, 02/13/2009, 02/14/2010, 02/18/2011, 02/10/2015, 02/14/2016, 01/31/2017, 02/03/2018   PFIZER(Purple Top)SARS-COV-2 Vaccination 05/07/2019, 06/01/2019, 12/29/2019   Pneumococcal Conjugate-13 02/14/2012   Pneumococcal Polysaccharide-23 08/12/2014     Objective: Vital Signs: There were no vitals taken for this visit.   Physical Exam   Musculoskeletal Exam: ***  CDAI Exam: CDAI Score: -- Patient Global: --; Provider Global: -- Swollen: --; Tender: -- Joint Exam 07/25/2022   No joint exam has been documented for this visit   There is currently no information documented on the homunculus. Go to the Rheumatology activity and complete the homunculus joint exam.  Investigation: No additional findings.  Imaging: No results found.  Recent Labs: Lab Results  Component Value Date   WBC 4.5 07/04/2022  HGB 13.8 07/04/2022   PLT 193 07/04/2022   NA 132 (L) 07/04/2022   K 4.8 07/04/2022   CL 94 (L) 07/04/2022   CO2 31 07/04/2022   GLUCOSE 142 (H) 07/04/2022   BUN 20 07/04/2022   CREATININE 0.46 (L)  07/04/2022   BILITOT 0.5 07/04/2022   ALKPHOS 55 01/26/2019   AST 19 07/04/2022   ALT 13 07/04/2022   PROT 6.7 07/04/2022   ALBUMIN 3.9 01/26/2019   CALCIUM 9.7 07/04/2022   GFRAA 104 05/12/2020    Speciality Comments: PLQ eye exam: 02/12/2021 WNL @ [facility] Follow up in 1 year  Prolia: 06/20/16, 08/11/17, 02/06/18, 08/17/18, 02/17/19, 05/17/19, 11/22/19, 05/29/20, 12/11/20, 06/27/21, 01/07/22  Procedures:  No procedures performed Allergies: Other and Tetracyclines & related   Assessment / Plan:     Visit Diagnoses: No diagnosis found.  Orders: No orders of the defined types were placed in this encounter.  No orders of the defined types were placed in this encounter.   Face-to-face time spent with patient was *** minutes. Greater than 50% of time was spent in counseling and coordination of care.  Follow-Up Instructions: No follow-ups on file.   Ellen Henri, CMA  Note - This record has been created using Animal nutritionist.  Chart creation errors have been sought, but may not always  have been located. Such creation errors do not reflect on  the standard of medical care.

## 2022-07-25 ENCOUNTER — Encounter: Payer: Self-pay | Admitting: Rheumatology

## 2022-07-25 ENCOUNTER — Ambulatory Visit: Payer: Medicare PPO | Attending: Rheumatology | Admitting: Rheumatology

## 2022-07-25 VITALS — BP 174/75 | HR 82 | Resp 13 | Ht 61.0 in | Wt 99.2 lb

## 2022-07-25 DIAGNOSIS — M19041 Primary osteoarthritis, right hand: Secondary | ICD-10-CM | POA: Diagnosis not present

## 2022-07-25 DIAGNOSIS — E559 Vitamin D deficiency, unspecified: Secondary | ICD-10-CM | POA: Diagnosis not present

## 2022-07-25 DIAGNOSIS — M19071 Primary osteoarthritis, right ankle and foot: Secondary | ICD-10-CM

## 2022-07-25 DIAGNOSIS — M810 Age-related osteoporosis without current pathological fracture: Secondary | ICD-10-CM | POA: Diagnosis not present

## 2022-07-25 DIAGNOSIS — L80 Vitiligo: Secondary | ICD-10-CM

## 2022-07-25 DIAGNOSIS — M5030 Other cervical disc degeneration, unspecified cervical region: Secondary | ICD-10-CM

## 2022-07-25 DIAGNOSIS — R7689 Other specified abnormal immunological findings in serum: Secondary | ICD-10-CM

## 2022-07-25 DIAGNOSIS — M353 Polymyalgia rheumatica: Secondary | ICD-10-CM | POA: Diagnosis not present

## 2022-07-25 DIAGNOSIS — Z8612 Personal history of poliomyelitis: Secondary | ICD-10-CM

## 2022-07-25 DIAGNOSIS — Z8719 Personal history of other diseases of the digestive system: Secondary | ICD-10-CM

## 2022-07-25 DIAGNOSIS — Z79899 Other long term (current) drug therapy: Secondary | ICD-10-CM

## 2022-07-25 DIAGNOSIS — E785 Hyperlipidemia, unspecified: Secondary | ICD-10-CM

## 2022-07-25 DIAGNOSIS — M19042 Primary osteoarthritis, left hand: Secondary | ICD-10-CM

## 2022-07-25 DIAGNOSIS — M41125 Adolescent idiopathic scoliosis, thoracolumbar region: Secondary | ICD-10-CM

## 2022-07-25 DIAGNOSIS — M19072 Primary osteoarthritis, left ankle and foot: Secondary | ICD-10-CM

## 2022-07-25 DIAGNOSIS — Z8781 Personal history of (healed) traumatic fracture: Secondary | ICD-10-CM

## 2022-07-25 DIAGNOSIS — D472 Monoclonal gammopathy: Secondary | ICD-10-CM

## 2022-07-25 DIAGNOSIS — Z8679 Personal history of other diseases of the circulatory system: Secondary | ICD-10-CM

## 2022-07-25 DIAGNOSIS — R5383 Other fatigue: Secondary | ICD-10-CM

## 2022-07-25 DIAGNOSIS — R768 Other specified abnormal immunological findings in serum: Secondary | ICD-10-CM

## 2022-08-01 ENCOUNTER — Ambulatory Visit: Payer: Medicare PPO | Admitting: Rheumatology

## 2022-08-01 DIAGNOSIS — Z8781 Personal history of (healed) traumatic fracture: Secondary | ICD-10-CM

## 2022-08-01 DIAGNOSIS — R5383 Other fatigue: Secondary | ICD-10-CM

## 2022-08-01 DIAGNOSIS — M353 Polymyalgia rheumatica: Secondary | ICD-10-CM

## 2022-08-01 DIAGNOSIS — Z8612 Personal history of poliomyelitis: Secondary | ICD-10-CM

## 2022-08-01 DIAGNOSIS — L80 Vitiligo: Secondary | ICD-10-CM

## 2022-08-01 DIAGNOSIS — E559 Vitamin D deficiency, unspecified: Secondary | ICD-10-CM

## 2022-08-01 DIAGNOSIS — M19041 Primary osteoarthritis, right hand: Secondary | ICD-10-CM

## 2022-08-01 DIAGNOSIS — Z8679 Personal history of other diseases of the circulatory system: Secondary | ICD-10-CM

## 2022-08-01 DIAGNOSIS — D472 Monoclonal gammopathy: Secondary | ICD-10-CM

## 2022-08-01 DIAGNOSIS — R768 Other specified abnormal immunological findings in serum: Secondary | ICD-10-CM

## 2022-08-01 DIAGNOSIS — Z79899 Other long term (current) drug therapy: Secondary | ICD-10-CM

## 2022-08-01 DIAGNOSIS — E785 Hyperlipidemia, unspecified: Secondary | ICD-10-CM

## 2022-08-01 DIAGNOSIS — M810 Age-related osteoporosis without current pathological fracture: Secondary | ICD-10-CM

## 2022-08-01 DIAGNOSIS — M19072 Primary osteoarthritis, left ankle and foot: Secondary | ICD-10-CM

## 2022-08-01 DIAGNOSIS — M5030 Other cervical disc degeneration, unspecified cervical region: Secondary | ICD-10-CM

## 2022-08-01 DIAGNOSIS — M41125 Adolescent idiopathic scoliosis, thoracolumbar region: Secondary | ICD-10-CM

## 2022-08-01 DIAGNOSIS — Z8719 Personal history of other diseases of the digestive system: Secondary | ICD-10-CM

## 2022-08-19 ENCOUNTER — Inpatient Hospital Stay: Payer: Medicare PPO

## 2022-08-19 ENCOUNTER — Inpatient Hospital Stay: Payer: Medicare PPO | Attending: Hematology and Oncology | Admitting: Hematology and Oncology

## 2022-08-19 ENCOUNTER — Encounter: Payer: Self-pay | Admitting: Hematology and Oncology

## 2022-08-19 VITALS — BP 171/59 | HR 66 | Temp 97.4°F | Resp 18 | Ht 61.0 in | Wt 99.0 lb

## 2022-08-19 DIAGNOSIS — M6284 Sarcopenia: Secondary | ICD-10-CM | POA: Diagnosis not present

## 2022-08-19 DIAGNOSIS — K5909 Other constipation: Secondary | ICD-10-CM | POA: Diagnosis not present

## 2022-08-19 DIAGNOSIS — M353 Polymyalgia rheumatica: Secondary | ICD-10-CM | POA: Diagnosis not present

## 2022-08-19 DIAGNOSIS — D472 Monoclonal gammopathy: Secondary | ICD-10-CM | POA: Diagnosis present

## 2022-08-19 LAB — CMP (CANCER CENTER ONLY)
ALT: 14 U/L (ref 0–44)
AST: 22 U/L (ref 15–41)
Albumin: 4.6 g/dL (ref 3.5–5.0)
Alkaline Phosphatase: 55 U/L (ref 38–126)
Anion gap: 6 (ref 5–15)
BUN: 19 mg/dL (ref 8–23)
CO2: 31 mmol/L (ref 22–32)
Calcium: 9.5 mg/dL (ref 8.9–10.3)
Chloride: 95 mmol/L — ABNORMAL LOW (ref 98–111)
Creatinine: 0.53 mg/dL (ref 0.44–1.00)
GFR, Estimated: >60 mL/min (ref >60)
Glucose, Bld: 72 mg/dL (ref 70–99)
Potassium: 4.3 mmol/L (ref 3.5–5.1)
Sodium: 132 mmol/L — ABNORMAL LOW (ref 135–145)
Total Bilirubin: 0.5 mg/dL (ref 0.3–1.2)
Total Protein: 7.4 g/dL (ref 6.5–8.1)

## 2022-08-19 LAB — CBC WITH DIFFERENTIAL (CANCER CENTER ONLY)
Abs Immature Granulocytes: 0.01 10*3/uL (ref 0.00–0.07)
Basophils Absolute: 0 10*3/uL (ref 0.0–0.1)
Basophils Relative: 1 %
Eosinophils Absolute: 0.1 10*3/uL (ref 0.0–0.5)
Eosinophils Relative: 1 %
HCT: 42 % (ref 36.0–46.0)
Hemoglobin: 14 g/dL (ref 12.0–15.0)
Immature Granulocytes: 0 %
Lymphocytes Relative: 30 %
Lymphs Abs: 1.6 10*3/uL (ref 0.7–4.0)
MCH: 30.5 pg (ref 26.0–34.0)
MCHC: 33.3 g/dL (ref 30.0–36.0)
MCV: 91.5 fL (ref 80.0–100.0)
Monocytes Absolute: 0.7 10*3/uL (ref 0.1–1.0)
Monocytes Relative: 13 %
Neutro Abs: 2.8 10*3/uL (ref 1.7–7.7)
Neutrophils Relative %: 55 %
Platelet Count: 175 10*3/uL (ref 150–400)
RBC: 4.59 MIL/uL (ref 3.87–5.11)
RDW: 11.9 % (ref 11.5–15.5)
WBC Count: 5.2 10*3/uL (ref 4.0–10.5)
nRBC: 0 % (ref 0.0–0.2)

## 2022-08-19 LAB — URIC ACID: Uric Acid, Serum: 4.4 mg/dL (ref 2.5–7.1)

## 2022-08-19 LAB — LACTATE DEHYDROGENASE: LDH: 162 U/L (ref 98–192)

## 2022-08-19 NOTE — Assessment & Plan Note (Signed)
We discussed briefly the natural history of IgM MGUS She has no signs of anemia It is not likely she has disease progression over the years despite lost to follow-up I reviewed her CT imaging from 2020 which show no evidence of abnormal findings I will order blood work today and I will see her next week to discuss test results

## 2022-08-19 NOTE — Assessment & Plan Note (Signed)
She has signs of sarcopenia We discussed importance of dietary protein We discussed weightbearing exercises as tolerated

## 2022-08-19 NOTE — Progress Notes (Signed)
[facility] FOLLOW-UP progress notes  Patient Care Team: Tracey Harries, MD as PCP - General (Family Medicine)   ASSESSMENT & PLAN:  MGUS (monoclonal gammopathy of unknown significance) We discussed briefly the natural history of IgM MGUS She has no signs of anemia It is not likely she has disease progression over the years despite lost to follow-up I reviewed her CT imaging from 2020 which show no evidence of abnormal findings I will order blood work today and I will see her next week to discuss test results  Polymyalgia rheumatica (HCC) She has been on prednisone before but none over the last 5 years I would defer to to new rheumatologist for further management MGUS has been associated with certain type of autoimmune disorders and I have seen in patients with diagnosis of polymyalgia rheumatica  Sarcopenia She has signs of sarcopenia We discussed importance of dietary protein We discussed weightbearing exercises as tolerated  Other constipation I recommend the patient to increase oral fluid intake and regular use of laxatives  Orders Placed This Encounter  Procedures   CBC with Differential (Cancer Center Only)    Standing Status:   Future    Number of Occurrences:   1    Standing Expiration Date:   08/19/2023   CMP (Cancer Center only)    Standing Status:   Future    Number of Occurrences:   1    Standing Expiration Date:   08/19/2023   Lactate dehydrogenase    Standing Status:   Future    Number of Occurrences:   1    Standing Expiration Date:   08/19/2023   Uric acid    Standing Status:   Future    Number of Occurrences:   1    Standing Expiration Date:   08/19/2023   Kappa/lambda light chains    Standing Status:   Future    Number of Occurrences:   1    Standing Expiration Date:   08/19/2023   Multiple Myeloma Panel (SPEP&IFE w/QIG)    Standing Status:   Future    Number of Occurrences:   1    Standing Expiration Date:   08/19/2023      All  questions were answered. The patient knows to call the clinic with any problems, questions or concerns. The total time spent in the appointment was 55 minutes encounter with patients including review of chart and various tests results, discussions about plan of care and coordination of care plan   Artis Delay, MD 08/19/2022 12:07 PM  CHIEF COMPLAINTS/PURPOSE OF VISIT:  MGUS  HISTORY OF PRESENTING ILLNESS:  Ellen Thomas 87 y.o. female was transferred to my care after her prior physician has left.  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  She was seen by 2 different hematologist and have since left.  She was lost to follow-up The patient suffered from poliomyelitis as a child and had left-sided weakness.  She also have diagnosis of polymyalgia rheumatica and has been on intermittent doses of prednisone for many years but not since 2019.  The patient have diagnosis of osteoporosis.  She have lost some weight gradually over the last few years. She denies bone pain.  She does have musculoskeletal problems related to degenerative spine disease.  The patient remains very active.  [facility] and exercises regularly with water related aerobics.  She has history of falls  She has chronic constipation; her appetite is great but she eats only very small meals throughout the years She denies abnormal lymphadenopathy.  MEDICAL HISTORY:  Past Medical History:  Diagnosis Date   High cholesterol    Hypertension    Kyphosis    PMR (polymyalgia rheumatica) (HCC)    Polio    Scoliosis    Vitiligo     SURGICAL HISTORY: Past Surgical History:  Procedure Laterality Date   ABDOMINAL HYSTERECTOMY  1993   CATARACT EXTRACTION, BILATERAL Bilateral    09/13/2021, 09/27/2021   SHOULDER FUSION SURGERY Left    took a piece of hip and fused it into left shoulder   TOTAL SHOULDER ARTHROPLASTY      SOCIAL HISTORY: Social History    Socioeconomic History   Marital status: Married    Spouse name: Not on file   Number of children: Not on file   Years of education: Not on file   Highest education level: Not on file  Occupational History   Not on file  Tobacco Use   Smoking status: Never    Passive exposure: Never   Smokeless tobacco: Never  Vaping Use   Vaping Use: Never used  Substance and Sexual Activity   Alcohol use: Yes    Alcohol/week: 7.0 standard drinks of alcohol    Types: 7 Glasses of wine per week    Comment: 3 or 4 oz daily (wine)   Drug use: Never   Sexual activity: Not on file  Other Topics Concern   Not on file  Social History Narrative   Not on file   Social Determinants of Health   Financial Resource Strain: Not on file  Food Insecurity: Not on file  Transportation Needs: Not on file  Physical Activity: Not on file  Stress: Not on file  Social Connections: Not on file  Intimate Partner Violence: Not on file    FAMILY HISTORY: Family History  Problem Relation Age of Onset   Diabetes Mother    Cancer Brother     ALLERGIES:  is allergic to other and tetracyclines & related.  MEDICATIONS:  Current Outpatient Medications  Medication Sig Dispense Refill   atorvastatin (LIPITOR) 20 MG tablet Take 20 mg by mouth daily.  0   Biotin 1000 MCG tablet Take 1,000 mcg by mouth daily.     calcium carbonate (TUMS - DOSED IN MG ELEMENTAL CALCIUM) 500 MG chewable tablet Chew 1 tablet by mouth daily as needed for indigestion or heartburn.     cholecalciferol (VITAMIN D) 1000 UNITS tablet Take 1,000 Units by mouth daily.     conjugated estrogens (PREMARIN) vaginal cream Place 1 Applicatorful vaginally every 7 (seven) days.      denosumab (PROLIA) 60 MG/ML SOSY injection Inject 60 mg into the skin every 6 (six) months. Courier to rheum: [address], [place] [address]. Appt on 07/11/22 1 mL 0   metoprolol succinate (TOPROL-XL) 25 MG 24 hr tablet Take 25 mg by mouth daily.   0    Multiple Vitamin (MULTIVITAMIN) capsule Take 1 capsule by mouth as needed.      Polyethyl Glycol-Propyl Glycol (SYSTANE OP) Apply 1 drop to eye daily as needed (dry eyes).     polyethylene glycol (MIRALAX / GLYCOLAX) 17 g packet Take 17 g by mouth daily as needed for mild constipation. 30 each 0   telmisartan (MICARDIS) 20 MG tablet Take 20 mg by mouth 2 (two) times daily.      No current  facility-administered medications for this visit.    REVIEW OF SYSTEMS:   Constitutional: Denies fevers, chills or abnormal night sweats Eyes: Denies blurriness of vision, double vision or watery eyes Ears, nose, mouth, throat, and face: Denies mucositis or sore throat Respiratory: Denies cough, dyspnea or wheezes Cardiovascular: Denies palpitation, chest discomfort or lower extremity swelling Skin: Denies abnormal skin rashes Lymphatics: Denies new lymphadenopathy or easy bruising Neurological:Denies numbness, tingling or new weaknesses Behavioral/Psych: Mood is stable, no new changes  All other systems were reviewed with the patient and are negative.  PHYSICAL EXAMINATION: ECOG PERFORMANCE STATUS: 1 - Symptomatic but completely ambulatory  Vitals:   08/19/22 1217  BP: (!) 171/59  Pulse: 66  Resp: 18  Temp: (!) 97.4 F (36.3 C)  SpO2: 100%   Filed Weights   08/19/22 1217  Weight: 99 lb (44.9 kg)    GENERAL:alert, no distress and comfortable.  She has some asymmetry on her head and neck region due to atrophy on the left side of her body SKIN: skin color, texture, turgor are normal, no rashes or significant lesions EYES: normal, conjunctiva are pink and non-injected, sclera clear OROPHARYNX:no exudate, normal lips, buccal mucosa, and tongue  NECK: supple, thyroid normal size, non-tender, without nodularity LYMPH:  no palpable lymphadenopathy in the cervical, axillary or inguinal LUNGS: clear to auscultation and percussion with normal breathing effort HEART: regular rate & rhythm and no  murmurs without lower extremity edema ABDOMEN:abdomen soft, non-tender and normal bowel sounds Musculoskeletal:no cyanosis of digits and no clubbing.  Scoliosis is noted.  Evidence of sarcopenia PSYCH: alert & oriented x 3 with fluent speech NEURO: no focal motor/sensory deficits  LABORATORY DATA:  I have reviewed the data as listed Lab Results  Component Value Date   WBC 5.2 08/19/2022   HGB 14.0 08/19/2022   HCT 42.0 08/19/2022   MCV 91.5 08/19/2022   PLT 175 08/19/2022   Recent Labs    12/21/21 1054 07/04/22 1544  NA 134* 132*  K 4.8 4.8  CL 95* 94*  CO2 30 31  GLUCOSE 75 142*  BUN 21 20  CREATININE 0.48* 0.46*  CALCIUM 9.7 9.7  PROT 6.9 6.7  AST 25 19  ALT 16 13  BILITOT 0.7 0.5    RADIOGRAPHIC STUDIES:I reviewed her prior CT imaging from 2020 I have personally reviewed the radiological images as listed and agreed with the findings in the report.

## 2022-08-19 NOTE — Assessment & Plan Note (Signed)
I recommend the patient to increase oral fluid intake and regular use of laxatives

## 2022-08-19 NOTE — Assessment & Plan Note (Signed)
She has been on prednisone before but none over the last 5 years I would defer to to new rheumatologist for further management MGUS has been associated with certain type of autoimmune disorders and I have seen in patients with diagnosis of polymyalgia rheumatica

## 2022-08-20 LAB — KAPPA/LAMBDA LIGHT CHAINS
Kappa free light chain: 11.4 mg/L (ref 3.3–19.4)
Kappa, lambda light chain ratio: 0.75 (ref 0.26–1.65)
Lambda free light chains: 15.1 mg/L (ref 5.7–26.3)

## 2022-08-27 LAB — MULTIPLE MYELOMA PANEL, SERUM
Albumin SerPl Elph-Mcnc: 4 g/dL (ref 2.9–4.4)
Albumin/Glob SerPl: 1.3 (ref 0.7–1.7)
Alpha 1: 0.3 g/dL (ref 0.0–0.4)
Alpha2 Glob SerPl Elph-Mcnc: 0.8 g/dL (ref 0.4–1.0)
B-Globulin SerPl Elph-Mcnc: 0.9 g/dL (ref 0.7–1.3)
Gamma Glob SerPl Elph-Mcnc: 1.1 g/dL (ref 0.4–1.8)
Globulin, Total: 3.1 g/dL (ref 2.2–3.9)
IgA: 84 mg/dL (ref 64–422)
IgG (Immunoglobin G), Serum: 752 mg/dL (ref 586–1602)
IgM (Immunoglobulin M), Srm: 457 mg/dL — ABNORMAL HIGH (ref 26–217)
M Protein SerPl Elph-Mcnc: 0.5 g/dL — ABNORMAL HIGH
Total Protein ELP: 7.1 g/dL (ref 6.0–8.5)

## 2022-08-30 ENCOUNTER — Encounter: Payer: Self-pay | Admitting: Hematology and Oncology

## 2022-08-30 ENCOUNTER — Inpatient Hospital Stay (HOSPITAL_BASED_OUTPATIENT_CLINIC_OR_DEPARTMENT_OTHER): Payer: Medicare PPO | Admitting: Hematology and Oncology

## 2022-08-30 VITALS — BP 161/50 | HR 70 | Temp 96.1°F | Resp 18 | Ht 61.0 in | Wt 97.5 lb

## 2022-08-30 DIAGNOSIS — D472 Monoclonal gammopathy: Secondary | ICD-10-CM

## 2022-08-30 NOTE — Assessment & Plan Note (Signed)
We discussed briefly the natural history of IgM MGUS She has no signs of end organ damage In comparison with her labs from 2015, the IgM level has gone up.  M protein was never done in the past Her lambda light chain is within normal limits I recommend annual follow-up

## 2022-08-30 NOTE — Progress Notes (Signed)
[facility] OFFICE PROGRESS NOTE  Patient Care Team: Tracey Harries, MD as PCP - General (Family Medicine)  ASSESSMENT & PLAN:  MGUS (monoclonal gammopathy of unknown significance) We discussed briefly the natural history of IgM MGUS She has no signs of end organ damage In comparison with her labs from 2015, the IgM level has gone up.  M protein was never done in the past Her lambda light chain is within normal limits I recommend annual follow-up  No orders of the defined types were placed in this encounter.   All questions were answered. The patient knows to call the clinic with any problems, questions or concerns. The total time spent in the appointment was 20 minutes encounter with patients including review of chart and various tests results, discussions about plan of care and coordination of care plan   Artis Delay, MD 08/30/2022 1:53 PM  INTERVAL HISTORY: Please see below for problem oriented charting. she returns for review of test results Since her last visit, she attempted to drink more fluids She remains very active with physical activity and exercise  REVIEW OF SYSTEMS:   Constitutional: Denies fevers, chills or abnormal weight loss Eyes: Denies blurriness of vision Ears, nose, mouth, throat, and face: Denies mucositis or sore throat Respiratory: Denies cough, dyspnea or wheezes Cardiovascular: Denies palpitation, chest discomfort or lower extremity swelling Gastrointestinal:  Denies nausea, heartburn or change in bowel habits Skin: Denies abnormal skin rashes Lymphatics: Denies new lymphadenopathy or easy bruising Neurological:Denies numbness, tingling or new weaknesses Behavioral/Psych: Mood is stable, no new changes  All other systems were reviewed with the patient and are negative.  I have reviewed the past medical history, past surgical history, social history and family history with the patient and they are unchanged from previous  note.  ALLERGIES:  is allergic to other and tetracyclines & related.  MEDICATIONS:  Current Outpatient Medications  Medication Sig Dispense Refill   atorvastatin (LIPITOR) 20 MG tablet Take 20 mg by mouth daily.  0   Biotin 1000 MCG tablet Take 1,000 mcg by mouth daily.     calcium carbonate (TUMS - DOSED IN MG ELEMENTAL CALCIUM) 500 MG chewable tablet Chew 1 tablet by mouth daily as needed for indigestion or heartburn.     cholecalciferol (VITAMIN D) 1000 UNITS tablet Take 1,000 Units by mouth daily.     conjugated estrogens (PREMARIN) vaginal cream Place 1 Applicatorful vaginally every 7 (seven) days.      denosumab (PROLIA) 60 MG/ML SOSY injection Inject 60 mg into the skin every 6 (six) months. Courier to rheum: [address], [address], [place] [address]. Appt on 07/11/22 1 mL 0   metoprolol succinate (TOPROL-XL) 25 MG 24 hr tablet Take 25 mg by mouth daily.   0   Multiple Vitamin (MULTIVITAMIN) capsule Take 1 capsule by mouth as needed.      Polyethyl Glycol-Propyl Glycol (SYSTANE OP) Apply 1 drop to eye daily as needed (dry eyes).     polyethylene glycol (MIRALAX / GLYCOLAX) 17 g packet Take 17 g by mouth daily as needed for mild constipation. 30 each 0   telmisartan (MICARDIS) 20 MG tablet Take 20 mg by mouth 2 (two) times daily.      No current facility-administered medications for this visit.    SUMMARY OF ONCOLOGIC HISTORY:  HEIRESS MATSUSHITA 87 y.o. female was transferred to my care after her prior physician has left.  The patient is noted to have IgM lambda MGUS dated back more  than 10 years ago.  She was seen by 2 different hematologist and have since left.  She was lost to follow-up The patient suffered from poliomyelitis as a child and had left-sided weakness.  She also have diagnosis of polymyalgia rheumatica and has been on intermittent doses of prednisone for many years but not since 2019.  The patient have diagnosis of osteoporosis.  She have lost some weight  gradually over the last few years. She denies bone pain.  She does have musculoskeletal problems related to degenerative spine disease.  The patient remains very active.  [facility] and exercises regularly with water related aerobics.  She has history of falls She has chronic constipation; her appetite is great but she eats only very small meals throughout the years She denies abnormal lymphadenopathy.  PHYSICAL EXAMINATION: ECOG PERFORMANCE STATUS: 0 - Asymptomatic  Vitals:   08/30/22 1344  BP: (!) 161/50  Pulse: 70  Resp: 18  Temp: (!) 96.1 F (35.6 C)  SpO2: 100%   Filed Weights   08/30/22 1344  Weight: 97 lb 8 oz (44.2 kg)    GENERAL:alert, no distress and comfortable NEURO: alert & oriented x 3 with fluent speech, no focal motor/sensory deficits  LABORATORY DATA:  I have reviewed the data as listed    Component Value Date/Time   NA 132 (L) 08/19/2022 1255   NA 133 (L) 09/13/2013 1307   K 4.3 08/19/2022 1255   K 4.6 09/13/2013 1307   CL 95 (L) 08/19/2022 1255   CO2 31 08/19/2022 1255   CO2 28 09/13/2013 1307   GLUCOSE 72 08/19/2022 1255   GLUCOSE 74 09/13/2013 1307   BUN 19 08/19/2022 1255   BUN 13.3 09/13/2013 1307   CREATININE 0.53 08/19/2022 1255   CREATININE 0.46 (L) 07/04/2022 1544   CREATININE 0.7 09/13/2013 1307   CALCIUM 9.5 08/19/2022 1255   CALCIUM 9.4 09/13/2013 1307   PROT 7.4 08/19/2022 1255   PROT 7.4 09/13/2013 1307   ALBUMIN 4.6 08/19/2022 1255   ALBUMIN 3.9 09/13/2013 1307   AST 22 08/19/2022 1255   AST 17 09/13/2013 1307   ALT 14 08/19/2022 1255   ALT 12 09/13/2013 1307   ALKPHOS 55 08/19/2022 1255   ALKPHOS 69 09/13/2013 1307   BILITOT 0.5 08/19/2022 1255   BILITOT 0.58 09/13/2013 1307   GFRNONAA >60 08/19/2022 1255   GFRNONAA 90 05/12/2020 1216   GFRAA 104 05/12/2020 1216    No results found for: "SPEP", "UPEP"  Lab Results  Component Value Date   WBC 5.2 08/19/2022   NEUTROABS 2.8 08/19/2022    HGB 14.0 08/19/2022   HCT 42.0 08/19/2022   MCV 91.5 08/19/2022   PLT 175 08/19/2022      Chemistry      Component Value Date/Time   NA 132 (L) 08/19/2022 1255   NA 133 (L) 09/13/2013 1307   K 4.3 08/19/2022 1255   K 4.6 09/13/2013 1307   CL 95 (L) 08/19/2022 1255   CO2 31 08/19/2022 1255   CO2 28 09/13/2013 1307   BUN 19 08/19/2022 1255   BUN 13.3 09/13/2013 1307   CREATININE 0.53 08/19/2022 1255   CREATININE 0.46 (L) 07/04/2022 1544   CREATININE 0.7 09/13/2013 1307      Component Value Date/Time   CALCIUM 9.5 08/19/2022 1255   CALCIUM 9.4 09/13/2013 1307   ALKPHOS 55 08/19/2022 1255   ALKPHOS 69 09/13/2013 1307   AST 22 08/19/2022 1255  AST 17 09/13/2013 1307   ALT 14 08/19/2022 1255   ALT 12 09/13/2013 1307   BILITOT 0.5 08/19/2022 1255   BILITOT 0.58 09/13/2013 1307

## 2022-12-11 ENCOUNTER — Other Ambulatory Visit (HOSPITAL_COMMUNITY): Payer: Self-pay

## 2022-12-27 ENCOUNTER — Other Ambulatory Visit: Payer: Self-pay

## 2023-01-02 ENCOUNTER — Other Ambulatory Visit (HOSPITAL_COMMUNITY): Payer: Self-pay

## 2023-01-02 ENCOUNTER — Other Ambulatory Visit: Payer: Self-pay

## 2023-01-02 ENCOUNTER — Telehealth: Payer: Self-pay | Admitting: Pharmacist

## 2023-01-02 DIAGNOSIS — Z79899 Other long term (current) drug therapy: Secondary | ICD-10-CM

## 2023-01-02 DIAGNOSIS — M810 Age-related osteoporosis without current pathological fracture: Secondary | ICD-10-CM

## 2023-01-02 MED ORDER — PROLIA 60 MG/ML ~~LOC~~ SOSY
60.0000 mg | PREFILLED_SYRINGE | SUBCUTANEOUS | 0 refills | Status: DC
Start: 2023-01-02 — End: 2023-07-14
  Filled 2023-01-02: qty 1, 180d supply, fill #0

## 2023-01-02 NOTE — Telephone Encounter (Signed)
Pt due for Prolia on 01/06/2023  Copay is $50 per test claim. Patient aware of copay. Rx sent to [facility] to be couriered to clinic by the date of the appt.  CBC, CMP, Vitamin D completed on 12/18/22 (in [facility])  Patient scheduled for Prolia appt on 01/08/2023 @ 11am.  Chesley Mires, PharmD, MPH, BCPS, CPP Clinical Pharmacist (Rheumatology and Pulmonology)

## 2023-01-02 NOTE — Progress Notes (Signed)
Specialty Pharmacy Refill Coordination Note  Ellen Thomas is a 87 y.o. female contacted today by the Rheumatology clinic regarding refills of specialty medication(s) Denosumab   Clinic requested Courier to Provider Office   Delivery date: 01/06/23   Verified address: [address], [place] [address]   Medication will be filled on 01/03/23.

## 2023-01-06 NOTE — Progress Notes (Unsigned)
Pharmacy Note  Subjective:   Patient presents to clinic today to receive bi-annual dose of Prolia. Patient's last dose of Prolia was on 07/10/2022. She has been helping to take care of husband after his hip replacement surgery, but she has no personal medical changes  Patient running a fever or have signs/symptoms of infection? No  Patient currently on antibiotics for the treatment of infection? No  Patient had fall in the last 6 months?  No   Patient taking calcium 1200 mg daily through diet or supplement and at least 800 units vitamin D? Yes  Objective:  CMP Component Ref Range & Units 12/18/2022  Glucose 70 - 99 mg/dL 97  BUN 8 - 27 mg/dL 19  Creatinine 9.56 - 2.13 mg/dL 0.86 Low   eGFR >57 QI/ONG/2.95 91  BUN/Creatinine Ratio 12 - 28 35 High   Sodium 134 - 144 mmol/L 132 Low   Potassium 3.5 - 5.2 mmol/L 3.9  Chloride 96 - 106 mmol/L 91 Low   CO2 20 - 29 mmol/L 23  CALCIUM 8.7 - 10.3 mg/dL 9.1  Total Protein 6.0 - 8.5 g/dL 6.9  Albumin, Serum 3.7 - 4.7 g/dL 4.5  Globulin, Total 1.5 - 4.5 g/dL 2.4  Total Bilirubin 0.0 - 1.2 mg/dL 0.6  Alkaline Phosphatase 44 - 121 IU/L 64  AST 0 - 40 IU/L 25  ALT (SGPT) 0 - 32 IU/L 15   CBC Component Ref Range & Units 12/18/2022  WBC 3.7 - 11.0 thou/mcL 4.4  RBC 4.01 - 4.90 million/mcL 4.41  HGB 12.2 - 14.9 gm/dL 28.4  HCT 13.2 - 44.0 % 39.9  MCV 82.0 - 98.0 fL 90.4  MCH 27.0 - 33.0 pg 30.3  MCHC 31.0 - 37.0 gm/dL 10.2  Plt Ct 725 - 366 thou/mcL 173  RDW CV 11.8 - 14.9 % 13.3  NEUTROPHIL % % 80.8  LYMPHOCYTE % % 14.6  MID% % 4.6  ABSOLUTE NEUTROPHIL COUNT 1.50 - 7.50 thou/mcL 3.60  ABSOLUTE LYMPHOCYTE COUNT 1.00 - 4.50 thou/mcL 0.60 Low   ABSOLUTE MID 0.1 - 1.5 thou/mcL <0.5   Component Ref Range & Units 12/18/2022  Vit D, 25-Hydroxy 30.0 - 100.0 ng/mL 55.1   Previous DEXA: July 13, 2019 T score -2.2 left femur overall BMD improved.  Updated DEXA 08/20/2021: Left femoral neck BMD 0.622 with T  score -2.0. Showed statistically significant increase in BMD of the lumbar spine and left hip.  The right hip and third distal right radius are stable  Assessment/Plan:   Reviewed importance of adequate dietary intake of calcium in addition to supplementation due to risk of hypocalcemia with Prolia.   Patient tolerated injection  well.   Administration details as below: Administrations This Visit     denosumab (PROLIA) injection 60 mg     Admin Date 01/08/2023 Action Given Dose 60 mg Route Subcutaneous Documented By Murrell Redden, RPH-CPP           Patient's next Prolia dose is due on 07/07/2023.  Patient is due for updated DEXA in May 2025.   All questions encouraged and answered.  Instructed patient to call with any further questions or concerns.

## 2023-01-06 NOTE — Telephone Encounter (Signed)
Prolia received from [facility]. Placed in fridge 

## 2023-01-08 ENCOUNTER — Ambulatory Visit: Payer: Medicare PPO | Attending: Rheumatology | Admitting: Pharmacist

## 2023-01-08 DIAGNOSIS — Z7689 Persons encountering health services in other specified circumstances: Secondary | ICD-10-CM

## 2023-01-08 DIAGNOSIS — M810 Age-related osteoporosis without current pathological fracture: Secondary | ICD-10-CM

## 2023-01-08 MED ORDER — DENOSUMAB 60 MG/ML ~~LOC~~ SOSY
60.0000 mg | PREFILLED_SYRINGE | Freq: Once | SUBCUTANEOUS | Status: AC
  Administered 2023-01-08: 60 mg via SUBCUTANEOUS

## 2023-01-10 NOTE — Progress Notes (Signed)
Office Visit Note  Patient: Ellen Thomas             Date of Birth: 1935-12-27           MRN: [id number]             PCP: Tracey Harries, MD Referring: Tracey Harries, MD Visit Date: 01/24/2023 Occupation: @GUAROCC @  Subjective:  Medication management  History of Present Illness: Ellen Thomas is a 87 y.o. female with polymyalgia rheumatica, osteoarthritis and osteoporosis.  Patient denies any increased muscular weakness or tenderness.  She does not have much discomfort in her joints.  She has been taking Prolia injections every 6 months.  Last Prolia injection was on January 08, 2023.    Activities of Daily Living:  Patient reports morning stiffness for 0 minutes.   Patient Denies nocturnal pain.  Difficulty dressing/grooming: Denies Difficulty climbing stairs: Reports Difficulty getting out of chair: Denies Difficulty using hands for taps, buttons, cutlery, and/or writing: Denies  Review of Systems  Constitutional:  Negative for fatigue.  HENT:  Positive for mouth dryness. Negative for mouth sores.   Eyes:  Positive for dryness.  Respiratory:  Negative for shortness of breath.   Cardiovascular:  Negative for chest pain and palpitations.  Gastrointestinal:  Positive for constipation. Negative for blood in stool and diarrhea.  Endocrine: Negative for increased urination.  Genitourinary:  Negative for involuntary urination.  Musculoskeletal:  Positive for muscle weakness. Negative for joint pain, gait problem, joint pain, joint swelling, myalgias, morning stiffness, muscle tenderness and myalgias.  Skin:  Negative for color change, rash, hair loss and sensitivity to sunlight.  Allergic/Immunologic: Negative for susceptible to infections.  Neurological:  Negative for dizziness and headaches.  Hematological:  Negative for swollen glands.  Psychiatric/Behavioral:  Negative for depressed mood and sleep disturbance. The patient is not nervous/anxious.     PMFS History:   Patient Active Problem List   Diagnosis Date Noted   Sarcopenia 08/19/2022   Other constipation 08/19/2022   Dehydration 01/25/2019   Diverticulitis 01/25/2019   Hyponatremia 01/25/2019   History of humerus fracture 04/23/2016   History of gastroesophageal reflux (GERD) 04/23/2016   Positive ANA (antinuclear antibody) 04/23/2016   History of poliomyelitis 04/23/2016   Vitiligo 04/23/2016   Scoliosis 04/23/2016   Foreign body granuloma of skin and subcutaneous tissue 11/16/2015   At risk for falling 03/24/2015   Bronchitis 03/10/2014   Encounter for general adult medical examination without abnormal findings 02/13/2014   BMI (body mass index), pediatric, 5% to less than 85% for age 73/07/2013   Hypercholesterolemia 04/05/2013   Benign hypertension 04/05/2013   Polymyalgia rheumatica (HCC) 04/05/2013   Other specified health status 04/05/2013   Disease of skin and subcutaneous tissue 03/11/2013   OP (osteoporosis) 03/11/2013   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   Esophagitis, reflux 03/11/2013   Neuralgia neuritis, sciatic nerve 03/11/2013   Encounter for screening for eye and ear disorders 03/11/2013   MGUS (monoclonal gammopathy of unknown significance) 11/01/2011    Past Medical History:  Diagnosis Date   High cholesterol    Hypertension    Kyphosis    PMR (polymyalgia rheumatica) (HCC)    Polio    Scoliosis    Vitiligo     Family History  Problem Relation Age of Onset   Diabetes Mother    Headache Sister    Cancer Brother    Barrett's esophagus Brother    Past Surgical History:  Procedure Laterality Date   ABDOMINAL  HYSTERECTOMY  1993   CATARACT EXTRACTION, BILATERAL Bilateral    09/13/2021, 09/27/2021   SHOULDER FUSION SURGERY Left    took a piece of hip and fused it into left shoulder   TOTAL SHOULDER ARTHROPLASTY     Social History   Social History Narrative   Not on file   Immunization History  Administered Date(s) Administered    Influenza,inj,Quad PF,6+ Mos 12/15/2006, 02/13/2009, 02/14/2010, 02/18/2011, 02/10/2015, 02/14/2016, 01/31/2017, 02/03/2018   PFIZER(Purple Top)SARS-COV-2 Vaccination 05/07/2019, 06/01/2019, 12/29/2019   Pfizer Covid-19 Vaccine Bivalent Booster 53yrs & up 12/14/2020   Pfizer(Comirnaty)Fall Seasonal Vaccine 12 years and older 01/11/2022   Pneumococcal Conjugate-13 02/14/2012   Pneumococcal Polysaccharide-23 08/12/2014     Objective: Vital Signs: BP 139/62 (BP Location: Right Arm, Patient Position: Sitting, Cuff Size: Normal)   Pulse 73   Resp 13   Ht 5\' 1"  (1.549 m)   Wt 97 lb 3.2 oz (44.1 kg)   BMI 18.37 kg/m    Physical Exam Vitals and nursing note reviewed.  Constitutional:      Appearance: She is well-developed.  HENT:     Head: Normocephalic and atraumatic.  Eyes:     Conjunctiva/sclera: Conjunctivae normal.  Cardiovascular:     Rate and Rhythm: Normal rate and regular rhythm.     Heart sounds: Normal heart sounds.  Pulmonary:     Effort: Pulmonary effort is normal.     Breath sounds: Normal breath sounds.  Abdominal:     General: Bowel sounds are normal.     Palpations: Abdomen is soft.  Musculoskeletal:     Cervical back: Normal range of motion.  Lymphadenopathy:     Cervical: No cervical adenopathy.  Skin:    General: Skin is warm and dry.     Capillary Refill: Capillary refill takes less than 2 seconds.  Neurological:     Mental Status: She is alert and oriented to person, place, and time.  Psychiatric:        Behavior: Behavior normal.      Musculoskeletal Exam: She had very limited flexion extension and lateral rotation of the cervical spine.  Thoracolumbar scoliosis was noted.  She had limited range of motion of her shoulders.  Left elbow joint contractures unchanged.  Her wrist joints were in good range of motion.  She had bilateral PIP and DIP thickening with no synovitis.  Hip joints and knee joints were in good range of motion.  There was no  tenderness over ankles or MTPs.  CDAI Exam: CDAI Score: -- Patient Global: --; Provider Global: -- Swollen: --; Tender: -- Joint Exam 01/24/2023   No joint exam has been documented for this visit   There is currently no information documented on the homunculus. Go to the Rheumatology activity and complete the homunculus joint exam.  Investigation: No additional findings.  Imaging: No results found.  Recent Labs: Lab Results  Component Value Date   WBC 5.2 08/19/2022   HGB 14.0 08/19/2022   PLT 175 08/19/2022   NA 132 (L) 08/19/2022   K 4.3 08/19/2022   CL 95 (L) 08/19/2022   CO2 31 08/19/2022   GLUCOSE 72 08/19/2022   BUN 19 08/19/2022   CREATININE 0.53 08/19/2022   BILITOT 0.5 08/19/2022   ALKPHOS 55 08/19/2022   AST 22 08/19/2022   ALT 14 08/19/2022   PROT 7.4 08/19/2022   ALBUMIN 4.6 08/19/2022   CALCIUM 9.5 08/19/2022   GFRAA 104 05/12/2020   December 18, 2022 CMP normal, CBC normal, vitamin D55.1  Speciality Comments: PLQ eye exam: 02/12/2021 WNL @ [facility] Follow up in 1 year  Prolia: 06/20/16, 08/11/17, 02/06/18, 08/17/18, 02/17/19, 05/17/19, 11/22/19, 05/29/20, 12/11/20, 06/27/21, 01/07/22,07/10/22, 01/08/23  Procedures:  No procedures performed Allergies: Other and Tetracyclines & related   Assessment / Plan:     Visit Diagnoses: Age-related osteoporosis without current pathological fracture - DEXA 08/20/2021: Left femoral neck BMD 0.622 with T score -2.0. Last prolia injection: 01/08/2023. She has been on Prolia injections since June 20, 2016.  Patient has been tolerating Prolia without any side effects.  Labs on December 18, 2022 CBC and CMP were stable and vitamin D was 55.1.  Vitamin D deficiency-vitamin D was 55.1 on December 18, 2022  Polymyalgia rheumatica [facility]) -patient has no muscular weakness or tenderness.  She is in remission. Patient has been off of long-term prednisone since 2019 with no recurrence of symptoms. Plaquenil d/c May 2023  after concern raised by Dr. Charlotte Thomas.  Primary osteoarthritis of both hands-PIP and DIP thickening was noted.  No synovitis was noted.  Primary osteoarthritis of both feet-she has been using orthotics which has been helpful.  DDD (degenerative disc disease), cervical-she can use to have limited range of motion of the cervical spine.  Scoliosis  Positive ANA (antinuclear antibody) - No clinical features of systemic lupus at this time.  History of humerus fracture  Other fatigue  History of diverticulitis  Vitiligo  History of poliomyelitis  History of gastroesophageal reflux (GERD)  Dyslipidemia  History of hypertension  MGUS (monoclonal gammopathy of unknown significance) - she was followed by oncology for MGUS.  Orders: No orders of the defined types were placed in this encounter.  No orders of the defined types were placed in this encounter.    Follow-Up Instructions: Return in about 6 months (around 07/25/2023) for Osteoporosis, Osteoarthritis.   Pollyann Savoy, MD  Note - This record has been created using Animal nutritionist.  Chart creation errors have been sought, but may not always  have been located. Such creation errors do not reflect on  the standard of medical care.

## 2023-01-24 ENCOUNTER — Ambulatory Visit: Payer: Medicare PPO | Attending: Rheumatology | Admitting: Rheumatology

## 2023-01-24 ENCOUNTER — Encounter: Payer: Self-pay | Admitting: Rheumatology

## 2023-01-24 VITALS — BP 139/62 | HR 73 | Resp 13 | Ht 61.0 in | Wt 97.2 lb

## 2023-01-24 DIAGNOSIS — Z8612 Personal history of poliomyelitis: Secondary | ICD-10-CM

## 2023-01-24 DIAGNOSIS — M41125 Adolescent idiopathic scoliosis, thoracolumbar region: Secondary | ICD-10-CM

## 2023-01-24 DIAGNOSIS — E559 Vitamin D deficiency, unspecified: Secondary | ICD-10-CM

## 2023-01-24 DIAGNOSIS — M19041 Primary osteoarthritis, right hand: Secondary | ICD-10-CM | POA: Diagnosis not present

## 2023-01-24 DIAGNOSIS — Z8781 Personal history of (healed) traumatic fracture: Secondary | ICD-10-CM

## 2023-01-24 DIAGNOSIS — Z8679 Personal history of other diseases of the circulatory system: Secondary | ICD-10-CM

## 2023-01-24 DIAGNOSIS — Z8719 Personal history of other diseases of the digestive system: Secondary | ICD-10-CM

## 2023-01-24 DIAGNOSIS — L80 Vitiligo: Secondary | ICD-10-CM

## 2023-01-24 DIAGNOSIS — R768 Other specified abnormal immunological findings in serum: Secondary | ICD-10-CM

## 2023-01-24 DIAGNOSIS — M19042 Primary osteoarthritis, left hand: Secondary | ICD-10-CM

## 2023-01-24 DIAGNOSIS — R5383 Other fatigue: Secondary | ICD-10-CM

## 2023-01-24 DIAGNOSIS — M810 Age-related osteoporosis without current pathological fracture: Secondary | ICD-10-CM

## 2023-01-24 DIAGNOSIS — M353 Polymyalgia rheumatica: Secondary | ICD-10-CM | POA: Diagnosis not present

## 2023-01-24 DIAGNOSIS — M5030 Other cervical disc degeneration, unspecified cervical region: Secondary | ICD-10-CM

## 2023-01-24 DIAGNOSIS — D472 Monoclonal gammopathy: Secondary | ICD-10-CM

## 2023-01-24 DIAGNOSIS — M19071 Primary osteoarthritis, right ankle and foot: Secondary | ICD-10-CM

## 2023-01-24 DIAGNOSIS — M19072 Primary osteoarthritis, left ankle and foot: Secondary | ICD-10-CM

## 2023-01-24 DIAGNOSIS — E785 Hyperlipidemia, unspecified: Secondary | ICD-10-CM

## 2023-02-06 ENCOUNTER — Other Ambulatory Visit: Payer: Self-pay

## 2023-06-19 ENCOUNTER — Other Ambulatory Visit: Payer: Self-pay

## 2023-07-14 ENCOUNTER — Telehealth: Payer: Self-pay | Admitting: Pharmacist

## 2023-07-14 ENCOUNTER — Other Ambulatory Visit: Payer: Self-pay | Admitting: *Deleted

## 2023-07-14 ENCOUNTER — Other Ambulatory Visit: Payer: Self-pay | Admitting: Pharmacy Technician

## 2023-07-14 ENCOUNTER — Other Ambulatory Visit (HOSPITAL_COMMUNITY): Payer: Self-pay

## 2023-07-14 ENCOUNTER — Other Ambulatory Visit: Payer: Self-pay

## 2023-07-14 DIAGNOSIS — M810 Age-related osteoporosis without current pathological fracture: Secondary | ICD-10-CM

## 2023-07-14 DIAGNOSIS — E559 Vitamin D deficiency, unspecified: Secondary | ICD-10-CM

## 2023-07-14 DIAGNOSIS — Z79899 Other long term (current) drug therapy: Secondary | ICD-10-CM

## 2023-07-14 MED ORDER — PROLIA 60 MG/ML ~~LOC~~ SOSY
60.0000 mg | PREFILLED_SYRINGE | SUBCUTANEOUS | 0 refills | Status: DC
  Filled 2023-07-14: qty 1, 180d supply, fill #0

## 2023-07-14 MED ORDER — DENOSUMAB 60 MG/ML ~~LOC~~ SOSY
60.0000 mg | PREFILLED_SYRINGE | Freq: Once | SUBCUTANEOUS | Status: AC
Start: 2023-07-22 — End: 2023-07-22
  Administered 2023-07-22: 60 mg via SUBCUTANEOUS

## 2023-07-14 NOTE — Progress Notes (Signed)
 Specialty Pharmacy Refill Coordination Note  Ellen Thomas is a 88 y.o. female contacted today regarding refills of specialty medication(s) Denosumab (Prolia)   Patient requested No data recorded  Delivery date: 07/15/23   Verified address: [address], [place] [address]   Medication will be filled on 07/15/23.

## 2023-07-14 NOTE — Telephone Encounter (Signed)
 Patient due for Prolia on 07/07/2023. Copay is $50. Rx sent to [facility]  Patient will stop by this afternoon for labs (CBC/CMET/Vitamin D)  Prolia appt scheduled for 07/22/23.  Geraldene Kleine, PharmD, MPH, BCPS, CPP Clinical Pharmacist (Rheumatology and Pulmonology)

## 2023-07-15 LAB — COMPREHENSIVE METABOLIC PANEL WITH GFR
AG Ratio: 2 (calc) (ref 1.0–2.5)
ALT: 10 U/L (ref 6–29)
AST: 17 U/L (ref 10–35)
Albumin: 4.5 g/dL (ref 3.6–5.1)
Alkaline phosphatase (APISO): 51 U/L (ref 37–153)
BUN/Creatinine Ratio: 41 (calc) — ABNORMAL HIGH (ref 6–22)
BUN: 19 mg/dL (ref 7–25)
CO2: 31 mmol/L (ref 20–32)
Calcium: 9.4 mg/dL (ref 8.6–10.4)
Chloride: 97 mmol/L — ABNORMAL LOW (ref 98–110)
Creat: 0.46 mg/dL — ABNORMAL LOW (ref 0.60–0.95)
Globulin: 2.3 g/dL (ref 1.9–3.7)
Glucose, Bld: 131 mg/dL — ABNORMAL HIGH (ref 65–99)
Potassium: 4.8 mmol/L (ref 3.5–5.3)
Sodium: 133 mmol/L — ABNORMAL LOW (ref 135–146)
Total Bilirubin: 0.5 mg/dL (ref 0.2–1.2)
Total Protein: 6.8 g/dL (ref 6.1–8.1)
eGFR: 94 mL/min/{1.73_m2} (ref >=60)

## 2023-07-15 LAB — CBC WITH DIFFERENTIAL/PLATELET
Absolute Lymphocytes: 805 {cells}/uL — ABNORMAL LOW (ref 850–3900)
Absolute Monocytes: 427 {cells}/uL (ref 200–950)
Basophils Absolute: 31 {cells}/uL (ref 0–200)
Basophils Relative: 0.7 %
Eosinophils Absolute: 62 {cells}/uL (ref 15–500)
Eosinophils Relative: 1.4 %
HCT: 41.2 % (ref 35.0–45.0)
Hemoglobin: 13.6 g/dL (ref 11.7–15.5)
MCH: 30 pg (ref 27.0–33.0)
MCHC: 33 g/dL (ref 32.0–36.0)
MCV: 90.9 fL (ref 80.0–100.0)
MPV: 9.1 fL (ref 7.5–12.5)
Monocytes Relative: 9.7 %
Neutro Abs: 3076 {cells}/uL (ref 1500–7800)
Neutrophils Relative %: 69.9 %
Platelets: 184 10*3/uL (ref 140–400)
RBC: 4.53 10*6/uL (ref 3.80–5.10)
RDW: 11.8 % (ref 11.0–15.0)
Total Lymphocyte: 18.3 %
WBC: 4.4 10*3/uL (ref 3.8–10.8)

## 2023-07-15 LAB — VITAMIN D 25 HYDROXY (VIT D DEFICIENCY, FRACTURES): Vit D, 25-Hydroxy: 52 ng/mL (ref 30–100)

## 2023-07-15 NOTE — Progress Notes (Signed)
 Lymphocyte count is mildly decreased.  Glucose is mildly elevated.  Sodium and chloride are low.  Vitamin D is in the desirable range.  Please forward results to her PCP.

## 2023-07-15 NOTE — Patient Instructions (Signed)
 Updated bone density due in May 2025 - order placed for [facility] to have to this completed there

## 2023-07-15 NOTE — Progress Notes (Signed)
 Pharmacy Note  Subjective:   Patient presents to clinic today to receive bi-annual dose of Prolia . Patient's last dose of Prolia  was on 01/08/2023.  She reports she fell on Sunday, 07/20/2023 and was assessed by ortho yesterday with xrays. Likely have rib fracture. Still has evident bruise on back. She has f/u with ortho again in 2 weeks.  Patient running a fever or have signs/symptoms of infection? No  Patient currently on antibiotics for the treatment of infection? No  Patient had fall in the last 6 months?  Yes  If yes, did it require medical attention? Yes   Patient taking calcium  1200 mg daily through diet or supplement and at least 800 units vitamin D ? Yes  Objective: CMP     Component Value Date/Time   NA 133 (L) 07/14/2023 1541   NA 133 (L) 09/13/2013 1307   K 4.8 07/14/2023 1541   K 4.6 09/13/2013 1307   CL 97 (L) 07/14/2023 1541   CO2 31 07/14/2023 1541   CO2 28 09/13/2013 1307   GLUCOSE 131 (H) 07/14/2023 1541   GLUCOSE 74 09/13/2013 1307   BUN 19 07/14/2023 1541   BUN 13.3 09/13/2013 1307   CREATININE 0.46 (L) 07/14/2023 1541   CREATININE 0.7 09/13/2013 1307   CALCIUM  9.4 07/14/2023 1541   CALCIUM  9.4 09/13/2013 1307   PROT 6.8 07/14/2023 1541   PROT 7.4 09/13/2013 1307   ALBUMIN 4.6 08/19/2022 1255   ALBUMIN 3.9 09/13/2013 1307   AST 17 07/14/2023 1541   AST 22 08/19/2022 1255   AST 17 09/13/2013 1307   ALT 10 07/14/2023 1541   ALT 14 08/19/2022 1255   ALT 12 09/13/2013 1307   ALKPHOS 55 08/19/2022 1255   ALKPHOS 69 09/13/2013 1307   BILITOT 0.5 07/14/2023 1541   BILITOT 0.5 08/19/2022 1255   BILITOT 0.58 09/13/2013 1307   GFRNONAA >60 08/19/2022 1255   GFRNONAA 90 05/12/2020 1216   GFRAA 104 05/12/2020 1216    CBC    Component Value Date/Time   WBC 4.4 07/14/2023 1541   RBC 4.53 07/14/2023 1541   HGB 13.6 07/14/2023 1541   HGB 14.0 08/19/2022 1255   HGB 13.7 09/13/2013 1307   HCT 41.2 07/14/2023 1541   HCT 42.9 09/13/2013 1307   PLT 184  07/14/2023 1541   PLT 175 08/19/2022 1255   PLT 239 09/13/2013 1307   MCV 90.9 07/14/2023 1541   MCV 91.0 09/13/2013 1307   MCH 30.0 07/14/2023 1541   MCHC 33.0 07/14/2023 1541   RDW 11.8 07/14/2023 1541   RDW 13.0 09/13/2013 1307   LYMPHSABS 1.6 08/19/2022 1255   LYMPHSABS 1.2 09/13/2013 1307   MONOABS 0.7 08/19/2022 1255   MONOABS 0.5 09/13/2013 1307   EOSABS 62 07/14/2023 1541   EOSABS 0.1 09/13/2013 1307   BASOSABS 31 07/14/2023 1541   BASOSABS 0.0 09/13/2013 1307    Lab Results  Component Value Date   VD25OH 52 07/14/2023   T-score: DEXA 08/20/2021: Left femoral neck BMD 0.622 with T score -2.0   Assessment/Plan:   Reviewed importance of adequate dietary intake of calcium  in addition to supplementation due to risk of hypocalcemia with Prolia .   Patient tolerated injection well.   Administration details as below: Administrations This Visit     denosumab  (PROLIA ) injection 60 mg     Admin Date 07/22/2023 Action Given Dose 60 mg Route Subcutaneous Documented By Hayley Horn S, RPH-CPP           Patient's next Prolia  dose  is due on 01/18/2024.  Patient is due for updated DEXA in May 2025. Order placed for [facility].   All questions encouraged and answered.  Instructed patient to call with any further questions or concerns.   Geraldene Kleine, PharmD, MPH, BCPS, CPP Clinical Pharmacist (Rheumatology and Pulmonology)

## 2023-07-22 ENCOUNTER — Ambulatory Visit: Attending: Rheumatology | Admitting: Pharmacist

## 2023-07-22 DIAGNOSIS — Z7689 Persons encountering health services in other specified circumstances: Secondary | ICD-10-CM

## 2023-07-22 DIAGNOSIS — M810 Age-related osteoporosis without current pathological fracture: Secondary | ICD-10-CM

## 2023-07-22 DIAGNOSIS — Z79899 Other long term (current) drug therapy: Secondary | ICD-10-CM

## 2023-07-22 MED ORDER — DENOSUMAB 60 MG/ML ~~LOC~~ SOSY
60.0000 mg | PREFILLED_SYRINGE | SUBCUTANEOUS | Status: AC
  Administered 2024-01-19: 60 mg via SUBCUTANEOUS

## 2023-08-21 ENCOUNTER — Other Ambulatory Visit: Payer: Self-pay | Admitting: Hematology and Oncology

## 2023-08-21 DIAGNOSIS — D472 Monoclonal gammopathy: Secondary | ICD-10-CM

## 2023-08-22 ENCOUNTER — Inpatient Hospital Stay: Payer: Medicare PPO | Attending: Hematology and Oncology

## 2023-08-22 DIAGNOSIS — D472 Monoclonal gammopathy: Secondary | ICD-10-CM | POA: Insufficient documentation

## 2023-08-22 LAB — CBC WITH DIFFERENTIAL/PLATELET
Abs Immature Granulocytes: 0.01 10*3/uL (ref 0.00–0.07)
Basophils Absolute: 0 10*3/uL (ref 0.0–0.1)
Basophils Relative: 0 %
Eosinophils Absolute: 0 10*3/uL (ref 0.0–0.5)
Eosinophils Relative: 1 %
HCT: 40.4 % (ref 36.0–46.0)
Hemoglobin: 13.7 g/dL (ref 12.0–15.0)
Immature Granulocytes: 0 %
Lymphocytes Relative: 18 %
Lymphs Abs: 0.7 10*3/uL (ref 0.7–4.0)
MCH: 30.3 pg (ref 26.0–34.0)
MCHC: 33.9 g/dL (ref 30.0–36.0)
MCV: 89.4 fL (ref 80.0–100.0)
Monocytes Absolute: 0.3 10*3/uL (ref 0.1–1.0)
Monocytes Relative: 8 %
Neutro Abs: 3.1 10*3/uL (ref 1.7–7.7)
Neutrophils Relative %: 73 %
Platelets: 186 10*3/uL (ref 150–400)
RBC: 4.52 MIL/uL (ref 3.87–5.11)
RDW: 12.7 % (ref 11.5–15.5)
WBC: 4.2 10*3/uL (ref 4.0–10.5)
nRBC: 0 % (ref 0.0–0.2)

## 2023-08-22 LAB — COMPREHENSIVE METABOLIC PANEL WITH GFR
ALT: 15 U/L (ref 0–44)
AST: 24 U/L (ref 15–41)
Albumin: 4.7 g/dL (ref 3.5–5.0)
Alkaline Phosphatase: 56 U/L (ref 38–126)
Anion gap: 7 (ref 5–15)
BUN: 19 mg/dL (ref 8–23)
CO2: 29 mmol/L (ref 22–32)
Calcium: 9.8 mg/dL (ref 8.9–10.3)
Chloride: 96 mmol/L — ABNORMAL LOW (ref 98–111)
Creatinine, Ser: 0.53 mg/dL (ref 0.44–1.00)
GFR, Estimated: >60 mL/min (ref >60)
Glucose, Bld: 109 mg/dL — ABNORMAL HIGH (ref 70–99)
Potassium: 4.2 mmol/L (ref 3.5–5.1)
Sodium: 132 mmol/L — ABNORMAL LOW (ref 135–145)
Total Bilirubin: 0.6 mg/dL (ref 0.0–1.2)
Total Protein: 7.6 g/dL (ref 6.5–8.1)

## 2023-08-25 LAB — MULTIPLE MYELOMA PANEL, SERUM
Albumin SerPl Elph-Mcnc: 4 g/dL (ref 2.9–4.4)
Albumin/Glob SerPl: 1.5 (ref 0.7–1.7)
Alpha 1: 0.3 g/dL (ref 0.0–0.4)
Alpha2 Glob SerPl Elph-Mcnc: 0.7 g/dL (ref 0.4–1.0)
B-Globulin SerPl Elph-Mcnc: 0.8 g/dL (ref 0.7–1.3)
Gamma Glob SerPl Elph-Mcnc: 1 g/dL (ref 0.4–1.8)
Globulin, Total: 2.8 g/dL (ref 2.2–3.9)
IgA: 97 mg/dL (ref 64–422)
IgG (Immunoglobin G), Serum: 789 mg/dL (ref 586–1602)
IgM (Immunoglobulin M), Srm: 418 mg/dL — ABNORMAL HIGH (ref 26–217)
M Protein SerPl Elph-Mcnc: 0.5 g/dL — ABNORMAL HIGH
Total Protein ELP: 6.8 g/dL (ref 6.0–8.5)

## 2023-08-26 LAB — KAPPA/LAMBDA LIGHT CHAINS
Kappa free light chain: 10 mg/L (ref 3.3–19.4)
Kappa, lambda light chain ratio: 0.84 (ref 0.26–1.65)
Lambda free light chains: 11.9 mg/L (ref 5.7–26.3)

## 2023-09-02 ENCOUNTER — Inpatient Hospital Stay: Payer: Medicare PPO | Attending: Hematology and Oncology | Admitting: Hematology and Oncology

## 2023-09-02 ENCOUNTER — Encounter: Payer: Self-pay | Admitting: Hematology and Oncology

## 2023-09-02 VITALS — BP 169/75 | HR 78 | Resp 18 | Ht 61.0 in | Wt 92.8 lb

## 2023-09-02 DIAGNOSIS — D472 Monoclonal gammopathy: Secondary | ICD-10-CM | POA: Diagnosis present

## 2023-09-02 NOTE — Assessment & Plan Note (Addendum)
 We discussed briefly the natural history of IgM MGUS She has no signs of end organ damage In comparison with her labs from last year, her IgM level is stable Her lambda light chain is within normal limits I recommend annual follow-up

## 2023-09-02 NOTE — Progress Notes (Signed)
 [facility] OFFICE PROGRESS NOTE  Patient Care Team: Alfredia Ina, MD as PCP - General (Family Medicine)  ASSESSMENT & PLAN:  Assessment & Plan MGUS (monoclonal gammopathy of unknown significance) We discussed briefly the natural history of IgM MGUS She has no signs of end organ damage In comparison with her labs from last year, her IgM level is stable Her lambda light chain is within normal limits I recommend annual follow-up  No orders of the defined types were placed in this encounter.    INTERVAL HISTORY: she returns for surveillance follow-up for diagnosis of MGUS Patient denies recurrent infection or bone pain We reviewed recent CBC, CMP and myeloma panel results  PHYSICAL EXAMINATION: ECOG PERFORMANCE STATUS: 0 - Asymptomatic  Vitals:   09/02/23 1157  BP: (!) 169/75  Pulse: 78  Resp: 18  SpO2: 99%

## 2023-09-03 LAB — HM DEXA SCAN

## 2023-09-04 ENCOUNTER — Telehealth: Payer: Self-pay

## 2023-09-04 NOTE — Telephone Encounter (Signed)
 Due around 07/25/2023 . Message sent to the front to schedule.  Please advise

## 2023-09-08 ENCOUNTER — Telehealth: Payer: Self-pay

## 2023-09-08 NOTE — Progress Notes (Unsigned)
 Office Visit Note  Patient: Ellen Thomas             Date of Birth: [birth year]           MRN: [id number]             PCP: Alfredia Ina, MD Referring: Alfredia Ina, MD Visit Date: 09/22/2023 Occupation: @GUAROCC @  Subjective:    History of Present Illness: Ellen Thomas is a 88 y.o. female with history of osteoporosis, polymyalgia rheumatica, and osteoarthritis.   She remains on prolia  60 mg sq injections every 6 months. Her last prolia  injection was administered on 07/22/23.   Vitamin D  WNL on 07/14/23.   DEXA updated on 09/03/23: LFN BMD 0.608 with T-score -2.2, Radius 7% improvement in BMD, Right total femur -3% change in BMD, left total femur -2% change in BMD.     Activities of Daily Living:  Patient reports morning stiffness for *** {minute/hour:19697}.   Patient {ACTIONS;DENIES/REPORTS:21021675::Denies} nocturnal pain.  Difficulty dressing/grooming: {ACTIONS;DENIES/REPORTS:21021675::Denies} Difficulty climbing stairs: {ACTIONS;DENIES/REPORTS:21021675::Denies} Difficulty getting out of chair: {ACTIONS;DENIES/REPORTS:21021675::Denies} Difficulty using hands for taps, buttons, cutlery, and/or writing: {ACTIONS;DENIES/REPORTS:21021675::Denies}  No Rheumatology ROS completed.   PMFS History:  Patient Active Problem List   Diagnosis Date Noted   Sarcopenia 08/19/2022   Other constipation 08/19/2022   Dehydration 01/25/2019   Diverticulitis 01/25/2019   Hyponatremia 01/25/2019   History of humerus fracture 04/23/2016   History of gastroesophageal reflux (GERD) 04/23/2016   Positive ANA (antinuclear antibody) 04/23/2016   History of poliomyelitis 04/23/2016   Vitiligo 04/23/2016   Scoliosis 04/23/2016   Foreign body granuloma of skin and subcutaneous tissue 11/16/2015   At risk for falling 03/24/2015   Bronchitis 03/10/2014   Encounter for general adult medical examination without abnormal findings 02/13/2014   BMI (body mass index), pediatric, 5%  to less than 85% for age 84/07/2013   Hypercholesterolemia 04/05/2013   Benign hypertension 04/05/2013   Polymyalgia rheumatica (HCC) 04/05/2013   Other specified health status 04/05/2013   Disease of skin and subcutaneous tissue 03/11/2013   OP (osteoporosis) 03/11/2013   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   Esophagitis, reflux 03/11/2013   Neuralgia neuritis, sciatic nerve 03/11/2013   Encounter for screening for eye and ear disorders 03/11/2013   MGUS (monoclonal gammopathy of unknown significance) 11/01/2011    Past Medical History:  Diagnosis Date   High cholesterol    Hypertension    Kyphosis    PMR (polymyalgia rheumatica) (HCC)    Polio    Scoliosis    Vitiligo     Family History  Problem Relation Age of Onset   Diabetes Mother    Headache Sister    Cancer Brother    Barrett's esophagus Brother    Past Surgical History:  Procedure Laterality Date   ABDOMINAL HYSTERECTOMY  1993   CATARACT EXTRACTION, BILATERAL Bilateral    09/13/2021, 09/27/2021   SHOULDER FUSION SURGERY Left    took a piece of hip and fused it into left shoulder   TOTAL SHOULDER ARTHROPLASTY     Social History   Social History Narrative   Not on file   Immunization History  Administered Date(s) Administered   Influenza,inj,Quad PF,6+ Mos 12/15/2006, 02/13/2009, 02/14/2010, 02/18/2011, 02/10/2015, 02/14/2016, 01/31/2017, 02/03/2018   PFIZER(Purple Top)SARS-COV-2 Vaccination 05/07/2019, 06/01/2019, 12/29/2019   Pfizer Covid-19 Vaccine Bivalent Booster 70yrs & up 12/14/2020   Pfizer(Comirnaty)Fall Seasonal Vaccine 12 years and older 01/11/2022   Pneumococcal Conjugate-13 02/14/2012   Pneumococcal Polysaccharide-23 08/12/2014     Objective: Vital  Signs: There were no vitals taken for this visit.   Physical Exam Vitals and nursing note reviewed.  Constitutional:      Appearance: She is well-developed.  HENT:     Head: Normocephalic and atraumatic.   Eyes:      Conjunctiva/sclera: Conjunctivae normal.    Cardiovascular:     Rate and Rhythm: Normal rate and regular rhythm.     Heart sounds: Normal heart sounds.  Pulmonary:     Effort: Pulmonary effort is normal.     Breath sounds: Normal breath sounds.  Abdominal:     General: Bowel sounds are normal.     Palpations: Abdomen is soft.   Musculoskeletal:     Cervical back: Normal range of motion.  Lymphadenopathy:     Cervical: No cervical adenopathy.   Skin:    General: Skin is warm and dry.     Capillary Refill: Capillary refill takes less than 2 seconds.   Neurological:     Mental Status: She is alert and oriented to person, place, and time.   Psychiatric:        Behavior: Behavior normal.      Musculoskeletal Exam: ***  CDAI Exam: CDAI Score: -- Patient Global: --; Provider Global: -- Swollen: --; Tender: -- Joint Exam 09/22/2023   No joint exam has been documented for this visit   There is currently no information documented on the homunculus. Go to the Rheumatology activity and complete the homunculus joint exam.  Investigation: No additional findings.  Imaging: No results found.  Recent Labs: Lab Results  Component Value Date   WBC 4.2 08/22/2023   HGB 13.7 08/22/2023   PLT 186 08/22/2023   NA 132 (L) 08/22/2023   K 4.2 08/22/2023   CL 96 (L) 08/22/2023   CO2 29 08/22/2023   GLUCOSE 109 (H) 08/22/2023   BUN 19 08/22/2023   CREATININE 0.53 08/22/2023   BILITOT 0.6 08/22/2023   ALKPHOS 56 08/22/2023   AST 24 08/22/2023   ALT 15 08/22/2023   PROT 7.6 08/22/2023   ALBUMIN 4.7 08/22/2023   CALCIUM  9.8 08/22/2023   GFRAA 104 05/12/2020    Speciality Comments: PLQ eye exam: 02/12/2021 WNL @ [facility] Follow up in 1 year  Prolia : 06/20/16, 08/11/17, 02/06/18, 08/17/18, 02/17/19, 05/17/19, 11/22/19, 05/29/20, 12/11/20, 06/27/21, 01/07/22,07/10/22, 01/08/23  Procedures:  No procedures performed Allergies: Other and Tetracyclines & related    Assessment / Plan:     Visit Diagnoses: Age-related osteoporosis without current pathological fracture  Vitamin D  deficiency  Polymyalgia rheumatica (HCC)  High risk medication use  Primary osteoarthritis of both hands  Primary osteoarthritis of both feet  DDD (degenerative disc disease), cervical  Scoliosis  Positive ANA (antinuclear antibody)  History of humerus fracture  Other fatigue  History of diverticulitis  Vitiligo  History of poliomyelitis  History of gastroesophageal reflux (GERD)  Dyslipidemia  History of hypertension  MGUS (monoclonal gammopathy of unknown significance)  Orders: No orders of the defined types were placed in this encounter.  No orders of the defined types were placed in this encounter.   Face-to-face time spent with patient was *** minutes. Greater than 50% of time was spent in counseling and coordination of care.  Follow-Up Instructions: No follow-ups on file.   Romayne Clubs, PA-C  Note - This record has been created using Dragon software.  Chart creation errors have been sought, but may not always  have been located. Such creation errors do not reflect on  the standard  of medical care.

## 2023-09-08 NOTE — Telephone Encounter (Signed)
 Received DEXA results from [facility].  Date of Scan: 09/03/2023  Lowest T-score:-2.2  BMD:0.608  Lowest site measured:Left Femoral Neck  DX: Osteopenia  Significant changes in BMD and site measured (5% and above): 7% change in BMD for R 1/3 radius  Current Regimen: Prolia  05/2023 , Vitamin D   Recommendation: Vitamin D , Calcium  and resistive exercises   Reviewed by:Jacinta Martinis PA-C  Next Appointment: 09/22/2023   [facility] for patient to contact the office for the recommendations.

## 2023-09-09 NOTE — Telephone Encounter (Signed)
 Patient advised of results and recommendations of Vitamin D , Calcium  and resistive exercises

## 2023-09-09 NOTE — Telephone Encounter (Signed)
Attempted to contact patient and left message for patient to call the office.  

## 2023-09-22 ENCOUNTER — Ambulatory Visit: Attending: Physician Assistant | Admitting: Physician Assistant

## 2023-09-22 ENCOUNTER — Encounter: Payer: Self-pay | Admitting: Physician Assistant

## 2023-09-22 VITALS — BP 171/69 | HR 80 | Resp 16 | Ht 60.0 in | Wt 95.0 lb

## 2023-09-22 DIAGNOSIS — M19041 Primary osteoarthritis, right hand: Secondary | ICD-10-CM

## 2023-09-22 DIAGNOSIS — Z79899 Other long term (current) drug therapy: Secondary | ICD-10-CM

## 2023-09-22 DIAGNOSIS — Z8612 Personal history of poliomyelitis: Secondary | ICD-10-CM

## 2023-09-22 DIAGNOSIS — Z8781 Personal history of (healed) traumatic fracture: Secondary | ICD-10-CM

## 2023-09-22 DIAGNOSIS — M353 Polymyalgia rheumatica: Secondary | ICD-10-CM

## 2023-09-22 DIAGNOSIS — D472 Monoclonal gammopathy: Secondary | ICD-10-CM

## 2023-09-22 DIAGNOSIS — E559 Vitamin D deficiency, unspecified: Secondary | ICD-10-CM

## 2023-09-22 DIAGNOSIS — M810 Age-related osteoporosis without current pathological fracture: Secondary | ICD-10-CM | POA: Diagnosis not present

## 2023-09-22 DIAGNOSIS — R5383 Other fatigue: Secondary | ICD-10-CM

## 2023-09-22 DIAGNOSIS — Z8719 Personal history of other diseases of the digestive system: Secondary | ICD-10-CM

## 2023-09-22 DIAGNOSIS — M19042 Primary osteoarthritis, left hand: Secondary | ICD-10-CM

## 2023-09-22 DIAGNOSIS — L80 Vitiligo: Secondary | ICD-10-CM

## 2023-09-22 DIAGNOSIS — M5030 Other cervical disc degeneration, unspecified cervical region: Secondary | ICD-10-CM

## 2023-09-22 DIAGNOSIS — Z8679 Personal history of other diseases of the circulatory system: Secondary | ICD-10-CM

## 2023-09-22 DIAGNOSIS — M19071 Primary osteoarthritis, right ankle and foot: Secondary | ICD-10-CM

## 2023-09-22 DIAGNOSIS — M41125 Adolescent idiopathic scoliosis, thoracolumbar region: Secondary | ICD-10-CM

## 2023-09-22 DIAGNOSIS — M19072 Primary osteoarthritis, left ankle and foot: Secondary | ICD-10-CM

## 2023-09-22 DIAGNOSIS — E785 Hyperlipidemia, unspecified: Secondary | ICD-10-CM

## 2023-09-22 DIAGNOSIS — R768 Other specified abnormal immunological findings in serum: Secondary | ICD-10-CM

## 2023-10-22 ENCOUNTER — Ambulatory Visit: Admitting: Physician Assistant

## 2024-01-05 ENCOUNTER — Telehealth: Payer: Self-pay | Admitting: Pharmacist

## 2024-01-05 ENCOUNTER — Other Ambulatory Visit (HOSPITAL_COMMUNITY): Payer: Self-pay

## 2024-01-05 DIAGNOSIS — M810 Age-related osteoporosis without current pathological fracture: Secondary | ICD-10-CM

## 2024-01-05 DIAGNOSIS — Z79899 Other long term (current) drug therapy: Secondary | ICD-10-CM

## 2024-01-05 DIAGNOSIS — E559 Vitamin D deficiency, unspecified: Secondary | ICD-10-CM

## 2024-01-05 NOTE — Telephone Encounter (Signed)
 Patient due for Prolia  on 01/18/2024. She will need updated CMP and Vitamin D   Test claim shows copay of $50.   ATC patient regarding labs. Unable to reach. Will f/u  Sherry Pennant, PharmD, MPH, BCPS, CPP Clinical Pharmacist [facility])

## 2024-01-07 ENCOUNTER — Other Ambulatory Visit: Payer: Self-pay

## 2024-01-07 MED ORDER — PROLIA 60 MG/ML ~~LOC~~ SOSY
60.0000 mg | PREFILLED_SYRINGE | SUBCUTANEOUS | 0 refills | Status: AC
Start: 2024-01-07 — End: ?
  Filled 2024-01-07 (×2): qty 1, 180d supply, fill #0

## 2024-01-07 NOTE — Progress Notes (Signed)
 Specialty Pharmacy Refill Coordination Note  Ellen Thomas is a 88 y.o. female assessed today regarding refills of clinic administered specialty medication(s) Denosumab  (PROLIA )   Clinic requested Courier to Provider Office   Delivery date: 01/14/24   Verified address: [address], [place] [address]   Medication will be filled on 10/14.

## 2024-01-07 NOTE — Telephone Encounter (Signed)
 Patient returned call. She will stop by labs by next week. Will schedule Prolia  appt once labs result. Rx sent to [facility] to be couriered to office by 01/19/2024 which is the earliest we would be able to administer Prolia   Guy Seese, PharmD, MPH, BCPS, CPP Clinical Pharmacist [facility])

## 2024-01-13 ENCOUNTER — Other Ambulatory Visit: Payer: Self-pay

## 2024-01-14 ENCOUNTER — Other Ambulatory Visit: Payer: Self-pay | Admitting: *Deleted

## 2024-01-14 DIAGNOSIS — Z79899 Other long term (current) drug therapy: Secondary | ICD-10-CM

## 2024-01-14 DIAGNOSIS — M810 Age-related osteoporosis without current pathological fracture: Secondary | ICD-10-CM

## 2024-01-14 DIAGNOSIS — E559 Vitamin D deficiency, unspecified: Secondary | ICD-10-CM

## 2024-01-15 ENCOUNTER — Ambulatory Visit: Payer: Self-pay | Admitting: Rheumatology

## 2024-01-15 LAB — COMPREHENSIVE METABOLIC PANEL WITH GFR
AG Ratio: 1.9 (calc) (ref 1.0–2.5)
ALT: 15 U/L (ref 6–29)
AST: 23 U/L (ref 10–35)
Albumin: 4.7 g/dL (ref 3.6–5.1)
Alkaline phosphatase (APISO): 51 U/L (ref 37–153)
BUN/Creatinine Ratio: 30 (calc) — ABNORMAL HIGH (ref 6–22)
BUN: 15 mg/dL (ref 7–25)
CO2: 30 mmol/L (ref 20–32)
Calcium: 10.2 mg/dL (ref 8.6–10.4)
Chloride: 92 mmol/L — ABNORMAL LOW (ref 98–110)
Creat: 0.5 mg/dL — ABNORMAL LOW (ref 0.60–0.95)
Globulin: 2.5 g/dL (ref 1.9–3.7)
Glucose, Bld: 133 mg/dL — ABNORMAL HIGH (ref 65–99)
Potassium: 3.9 mmol/L (ref 3.5–5.3)
Sodium: 131 mmol/L — ABNORMAL LOW (ref 135–146)
Total Bilirubin: 0.7 mg/dL (ref 0.2–1.2)
Total Protein: 7.2 g/dL (ref 6.1–8.1)
eGFR: 92 mL/min/1.73m2 (ref >=60)

## 2024-01-15 LAB — VITAMIN D 25 HYDROXY (VIT D DEFICIENCY, FRACTURES): Vit D, 25-Hydroxy: 40 ng/mL (ref 30–100)

## 2024-01-15 NOTE — Progress Notes (Signed)
 Glucose is elevated, probably not a fasting sample.  Sodium is low and stable.  Vitamin D  is in the desirable range.

## 2024-01-19 ENCOUNTER — Ambulatory Visit: Attending: Rheumatology | Admitting: Pharmacist

## 2024-01-19 DIAGNOSIS — Z7689 Persons encountering health services in other specified circumstances: Secondary | ICD-10-CM

## 2024-01-19 DIAGNOSIS — M810 Age-related osteoporosis without current pathological fracture: Secondary | ICD-10-CM

## 2024-01-19 DIAGNOSIS — Z79899 Other long term (current) drug therapy: Secondary | ICD-10-CM

## 2024-01-19 MED ORDER — DENOSUMAB 60 MG/ML ~~LOC~~ SOSY: 60.0000 mg | PREFILLED_SYRINGE | SUBCUTANEOUS | Status: AC

## 2024-01-19 NOTE — Progress Notes (Signed)
 Pharmacy Note  Subjective:   Patient presents to clinic today to receive bi-annual dose of denosumab . Patient's last dose of denosumab  was on 07/22/2023. She reports that her husband recently fell with subsequent tibial fracture. She has been helping to care for him - he is in a facility with around-the-clock care, but she and her daughter have been visiting daily.  Patient running a fever or have signs/symptoms of infection? No  Patient currently on antibiotics for the treatment of infection? No  Patient had fall in the last 6 months?  No  Patient taking calcium  1200 mg daily through diet or supplement and at least 800 units vitamin D ? Yes  Objective: CMP     Component Value Date/Time   NA 131 (L) 01/14/2024 1037   NA 133 (L) 09/13/2013 1307   K 3.9 01/14/2024 1037   K 4.6 09/13/2013 1307   CL 92 (L) 01/14/2024 1037   CO2 30 01/14/2024 1037   CO2 28 09/13/2013 1307   GLUCOSE 133 (H) 01/14/2024 1037   GLUCOSE 74 09/13/2013 1307   BUN 15 01/14/2024 1037   BUN 13.3 09/13/2013 1307   CREATININE 0.50 (L) 01/14/2024 1037   CREATININE 0.7 09/13/2013 1307   CALCIUM  10.2 01/14/2024 1037   CALCIUM  9.4 09/13/2013 1307   PROT 7.2 01/14/2024 1037   PROT 7.4 09/13/2013 1307   ALBUMIN 4.7 08/22/2023 1515   ALBUMIN 3.9 09/13/2013 1307   AST 23 01/14/2024 1037   AST 22 08/19/2022 1255   AST 17 09/13/2013 1307   ALT 15 01/14/2024 1037   ALT 14 08/19/2022 1255   ALT 12 09/13/2013 1307   ALKPHOS 56 08/22/2023 1515   ALKPHOS 69 09/13/2013 1307   BILITOT 0.7 01/14/2024 1037   BILITOT 0.5 08/19/2022 1255   BILITOT 0.58 09/13/2013 1307   GFRNONAA >60 08/22/2023 1515   GFRNONAA >60 08/19/2022 1255   GFRNONAA 90 05/12/2020 1216   GFRAA 104 05/12/2020 1216    CBC    Component Value Date/Time   WBC 4.2 08/22/2023 1515   RBC 4.52 08/22/2023 1515   HGB 13.7 08/22/2023 1515   HGB 14.0 08/19/2022 1255   HGB 13.7 09/13/2013 1307   HCT 40.4 08/22/2023 1515   HCT 42.9 09/13/2013 1307    PLT 186 08/22/2023 1515   PLT 175 08/19/2022 1255   PLT 239 09/13/2013 1307   MCV 89.4 08/22/2023 1515   MCV 91.0 09/13/2013 1307   MCH 30.3 08/22/2023 1515   MCHC 33.9 08/22/2023 1515   RDW 12.7 08/22/2023 1515   RDW 13.0 09/13/2013 1307   LYMPHSABS 0.7 08/22/2023 1515   LYMPHSABS 1.2 09/13/2013 1307   MONOABS 0.3 08/22/2023 1515   MONOABS 0.5 09/13/2013 1307   EOSABS 0.0 08/22/2023 1515   EOSABS 0.1 09/13/2013 1307   BASOSABS 0.0 08/22/2023 1515   BASOSABS 0.0 09/13/2013 1307    Lab Results  Component Value Date   VD25OH 40 01/14/2024   DEXA 08/20/21: Left femoral neck BMD 0.622 with T score -2.0.   09/03/23: left femoral neck BMD 0.608 with T-score -2.2, Radius 7% improvement in BMD, Right total femur -3% change in BMD, left total femur -2% change in BMD  Assessment/Plan:   Reviewed importance of adequate dietary intake of calcium  in addition to supplementation due to risk of hypocalcemia with denosumab . She continues to get regular exercise which was briefly interrupted with her husband's fall and caregiving needs.  Patient tolerated injection well.   Administration details as below: Administrations This Visit  denosumab  (PROLIA ) injection 60 mg     Admin Date 01/19/2024 Action Given Dose 60 mg Route Subcutaneous Documented By Dayne Sherry RAMAN, RPH-CPP           Patient's next denosumab  dose is due on 07/17/2024.  Patient is due for updated DEXA in June 2027.   All questions encouraged and answered.  Instructed patient to call with any further questions or concerns.  Sherry Dayne, PharmD, MPH, BCPS, CPP Clinical Pharmacist [facility])

## 2024-02-02 ENCOUNTER — Encounter: Payer: Self-pay | Admitting: Radiology

## 2024-03-04 ENCOUNTER — Other Ambulatory Visit: Payer: Self-pay

## 2024-04-01 NOTE — Progress Notes (Unsigned)
 "  Office Visit Note  Patient: Ellen Thomas             Date of Birth: 03-Mar-1936           MRN: [id number]             PCP: Pura Lenis, MD Referring: Pura Lenis, MD Visit Date: 04/14/2024 Occupation: Data Unavailable  Subjective:  Routine follow-up  History of Present Illness: Ellen Thomas is a 89 y.o. female with history of osteoporosis and polymyalgia rheumatica.  Patient remain on prolia  60 mg sq injections every 6 months.  Her most recent prolia  injection was administered on 01/19/24.  Patient denies any signs or symptoms of a polymyalgia rheumatica flare.  She has not had any temporal artery tenderness.  Patient states that she has recently had some increased tension headaches which she attributes to being under increased stress since her husband had a fall and fractured his tibia.  Patient has been visiting her husband in assisted living and has been under increased stress.  She has also been unavailable to go to her normal pool exercise class which she feels has caused him deconditioning.  She occasionally will experience numbness in the right foot after walking for prolonged periods of time.  Patient states that she has noticed some unintentional weight loss and is unsure if it is related to the stress she has been under or deconditioning. She denies any recent or recurrent infections.       Activities of Daily Living:  Patient reports morning stiffness for 0 minute.   Patient Reports nocturnal pain.  Difficulty dressing/grooming: Denies Difficulty climbing stairs: Reports Difficulty getting out of chair: Denies Difficulty using hands for taps, buttons, cutlery, and/or writing: Reports  Review of Systems  Constitutional:  Positive for fatigue.  HENT:  Negative for mouth sores and mouth dryness.   Eyes:  Negative for dryness.  Respiratory:  Negative for shortness of breath.   Cardiovascular:  Negative for chest pain and palpitations.  Gastrointestinal:   Positive for constipation. Negative for blood in stool and diarrhea.  Endocrine: Negative for increased urination.  Genitourinary:  Negative for involuntary urination.  Musculoskeletal:  Positive for joint pain, gait problem, joint pain, myalgias, muscle weakness and myalgias. Negative for joint swelling, morning stiffness and muscle tenderness.  Skin:  Positive for sensitivity to sunlight. Negative for color change, rash and hair loss.  Allergic/Immunologic: Negative for susceptible to infections.  Neurological:  Positive for headaches. Negative for dizziness.  Hematological:  Negative for swollen glands.  Psychiatric/Behavioral:  Positive for sleep disturbance. Negative for depressed mood. The patient is nervous/anxious.     PMFS History:  Patient Active Problem List   Diagnosis Date Noted   Sarcopenia 08/19/2022   Other constipation 08/19/2022   Dehydration 01/25/2019   Diverticulitis 01/25/2019   Hyponatremia 01/25/2019   History of humerus fracture 04/23/2016   History of gastroesophageal reflux (GERD) 04/23/2016   Positive ANA (antinuclear antibody) 04/23/2016   History of poliomyelitis 04/23/2016   Vitiligo 04/23/2016   Scoliosis 04/23/2016   Foreign body granuloma of skin and subcutaneous tissue 11/16/2015   At risk for falling 03/24/2015   Bronchitis 03/10/2014   Encounter for general adult medical examination without abnormal findings 02/13/2014   BMI (body mass index), pediatric, 5% to less than 85% for age 80/07/2013   Hypercholesterolemia 04/05/2013   Benign hypertension 04/05/2013   Polymyalgia rheumatica 04/05/2013   Other specified health status 04/05/2013   Disease of skin and subcutaneous tissue  03/11/2013   OP (osteoporosis) 03/11/2013   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   Esophagitis, reflux 03/11/2013   Neuralgia neuritis, sciatic nerve 03/11/2013   Encounter for screening for eye and ear disorders 03/11/2013   MGUS (monoclonal  gammopathy of unknown significance) 11/01/2011    Past Medical History:  Diagnosis Date   High cholesterol    Hypertension    Kyphosis    PMR (polymyalgia rheumatica)    Polio    Scoliosis    Vitiligo     Family History  Problem Relation Age of Onset   Diabetes Mother    Headache Sister    Cancer Brother    Barrett's esophagus Brother    Past Surgical History:  Procedure Laterality Date   ABDOMINAL HYSTERECTOMY  1993   CATARACT EXTRACTION, BILATERAL Bilateral    09/13/2021, 09/27/2021   SHOULDER FUSION SURGERY Left    took a piece of hip and fused it into left shoulder   TOTAL SHOULDER ARTHROPLASTY     Social History[1] Social History   Social History Narrative   Not on file     Immunization History  Administered Date(s) Administered   Influenza,inj,Quad PF,6+ Mos 12/15/2006, 02/13/2009, 02/14/2010, 02/18/2011, 02/10/2015, 02/14/2016, 01/31/2017, 02/03/2018   PFIZER(Purple Top)SARS-COV-2 Vaccination 05/07/2019, 06/01/2019, 12/29/2019   Pfizer Covid-19 Vaccine Bivalent Booster 51yrs & up 12/14/2020   Pfizer(Comirnaty)Fall Seasonal Vaccine 12 years and older 01/11/2022   Pneumococcal Conjugate-13 02/14/2012   Pneumococcal Polysaccharide-23 08/12/2014     Objective: Vital Signs: BP (!) 156/64   Pulse 88   Temp 97.6 F (36.4 C)   Resp 14   Ht 5' 1 (1.549 m)   Wt 87 lb 9.6 oz (39.7 kg)   BMI 16.55 kg/m    Physical Exam Vitals and nursing note reviewed.  Constitutional:      Appearance: She is well-developed.  HENT:     Head: Normocephalic and atraumatic.  Eyes:     Conjunctiva/sclera: Conjunctivae normal.  Cardiovascular:     Rate and Rhythm: Normal rate and regular rhythm.     Heart sounds: Normal heart sounds.  Pulmonary:     Effort: Pulmonary effort is normal.     Breath sounds: Normal breath sounds.  Abdominal:     General: Bowel sounds are normal.     Palpations: Abdomen is soft.  Musculoskeletal:     Cervical back: Normal range of motion.   Lymphadenopathy:     Cervical: No cervical adenopathy.  Skin:    General: Skin is warm and dry.     Capillary Refill: Capillary refill takes less than 2 seconds.  Neurological:     Mental Status: She is alert and oriented to person, place, and time.  Psychiatric:        Behavior: Behavior normal.      Musculoskeletal Exam: C-spine has severely limited range of motion bilateral rotation.  Thoracolumbar scoliosis noted.  Limited range of motion of both shoulders.  Left elbow flexion contracture.  Wrist joints have good range of motion with no tenderness or synovitis.  PIP and DIP thickening consistent with osteoarthritis of both hands.  No warmth or effusion of knee joints noted.  No tenderness or swelling of ankle joints.  CDAI Exam: CDAI Score: -- Patient Global: --; Provider Global: -- Swollen: --; Tender: -- Joint Exam 04/14/2024   No joint exam has been documented for this visit   There is currently no information documented on the homunculus. Go to the Rheumatology activity and complete the homunculus joint  exam.  Investigation: No additional findings.  Imaging: No results found.  Recent Labs: Lab Results  Component Value Date   WBC 4.2 08/22/2023   HGB 13.7 08/22/2023   PLT 186 08/22/2023   NA 131 (L) 01/14/2024   K 3.9 01/14/2024   CL 92 (L) 01/14/2024   CO2 30 01/14/2024   GLUCOSE 133 (H) 01/14/2024   BUN 15 01/14/2024   CREATININE 0.50 (L) 01/14/2024   BILITOT 0.7 01/14/2024   ALKPHOS 56 08/22/2023   AST 23 01/14/2024   ALT 15 01/14/2024   PROT 7.2 01/14/2024   ALBUMIN 4.7 08/22/2023   CALCIUM  10.2 01/14/2024   GFRAA 104 05/12/2020    Speciality Comments: PLQ eye exam: 02/12/2021 WNL @ [facility] Follow up in 1 year  Prolia : 06/20/16, 08/11/17, 02/06/18, 08/17/18, 02/17/19, 05/17/19, 11/22/19, 05/29/20, 12/11/20, 06/27/21, 01/07/22,07/10/22, 01/08/23  Procedures:  No procedures performed Allergies: Other and Tetracyclines & related   Assessment  / Plan:     Visit Diagnoses: Age-related osteoporosis without current pathological fracture: Age-related osteoporosis without current pathological fracture -  Previous DEXA 08/20/2021: Left femoral neck BMD 0.622 with T score -2.0.  She has been on Prolia  injections since June 20, 2016.  DEXA updated on 09/03/23: LFN BMD 0.608 with T-score -2.2, Radius 7% improvement in BMD, Right total femur -3% change in BMD, left total femur -2% change in BMD.   Her most recent Prolia  injection was administered on 07/22/2023.  She has been tolerating Prolia  without any side effects or injection site reactions. She had a fall in April landing on her back.  She was evaluated by orthopedics.  According to the patient there was concern about a possible rib fracture but she states that her pain resolved within 4 weeks with no recurrence. Vitamin D  WNL on 07/14/23.  Calcium  WNL-9.8 on 08/22/23.  She continues to take a calcium  vitamin D  supplement daily. Previously was going to water aerobics 3 to 4 days/week for exercise--plans to resume once the pool reopens.   Due to update DEXA again in June 2027. Patient remain on Prolia  60 mg subcutaneous injections every 6 months.  She was advised to notify us  if she has more frequent falls or a fracture.  She will follow-up in the office in 6 months or sooner if needed.   Vitamin D  deficiency: Vitamin D  was normal on 07/14/2023.  Vitamin D  was 40 on 01/14/2024.  High risk medication use - Not currently taking immunosuppressive agents.  Discontinued Plaquenil  in May 2023.  Polymyalgia rheumatica - In remission. Patient has been off of long-term prednisone  since 2019 with no recurrence of symptoms. Plaquenil  d/c May 2023 after concern raised by Dr. McCuen. No temporal artery tenderness.   No signs or symptoms of a flare.    Primary osteoarthritis of both hands: PIP and DIP thickening consistent with osteoarthritis of both hands.  No synovitis noted.  Complete fist formation  bilaterally.  Primary osteoarthritis of both feet: No tenderness or swelling in the ankle joints.  DDD (degenerative disc disease), cervical: C-spine has severely limited range of motion.  Scoliosis: Thoracolumbar scoliosis noted.  She has been experiencing occasional numbness in the right foot while walking prolonged distances.  If the symptoms persist or worsen recommended further evaluation of the lower back.  Positive ANA (antinuclear antibody) - No clinical features of systemic lupus at this time.  Other medical conditions are listed as follows:   History of humerus fracture  Other fatigue  History of diverticulitis  Vitiligo  History of poliomyelitis  History of gastroesophageal reflux (GERD)  Dyslipidemia  History of hypertension: Blood pressure was elevated today in the office and was rechecked prior to leaving.  Patient was advised to monitor blood pressure closely and reach out to PCP.   MGUS (monoclonal gammopathy of unknown significance)  Orders: No orders of the defined types were placed in this encounter.  No orders of the defined types were placed in this encounter.    Follow-Up Instructions: Return in about 5 months (around 09/12/2024) for Osteoporosis, Polymyalgia Rheumatica.   Ellen CHRISTELLA Craze, PA-C  Note - This record has been created using Dragon software.  Chart creation errors have been sought, but may not always  have been located. Such creation errors do not reflect on  the standard of medical care.     [1]  Social History Tobacco Use   Smoking status: Never    Passive exposure: Past   Smokeless tobacco: Never  Vaping Use   Vaping status: Never Used  Substance Use Topics   Alcohol use: Not Currently    Comment: occ wine   Drug use: Never   "

## 2024-04-14 ENCOUNTER — Encounter: Payer: Self-pay | Admitting: Physician Assistant

## 2024-04-14 ENCOUNTER — Ambulatory Visit: Attending: Physician Assistant | Admitting: Physician Assistant

## 2024-04-14 VITALS — BP 156/64 | HR 88 | Temp 97.6°F | Resp 14 | Ht 61.0 in | Wt 87.6 lb

## 2024-04-14 DIAGNOSIS — E559 Vitamin D deficiency, unspecified: Secondary | ICD-10-CM

## 2024-04-14 DIAGNOSIS — Z79899 Other long term (current) drug therapy: Secondary | ICD-10-CM

## 2024-04-14 DIAGNOSIS — M19041 Primary osteoarthritis, right hand: Secondary | ICD-10-CM | POA: Diagnosis not present

## 2024-04-14 DIAGNOSIS — M810 Age-related osteoporosis without current pathological fracture: Secondary | ICD-10-CM | POA: Diagnosis not present

## 2024-04-14 DIAGNOSIS — D472 Monoclonal gammopathy: Secondary | ICD-10-CM

## 2024-04-14 DIAGNOSIS — E785 Hyperlipidemia, unspecified: Secondary | ICD-10-CM

## 2024-04-14 DIAGNOSIS — M41125 Adolescent idiopathic scoliosis, thoracolumbar region: Secondary | ICD-10-CM

## 2024-04-14 DIAGNOSIS — Z8679 Personal history of other diseases of the circulatory system: Secondary | ICD-10-CM

## 2024-04-14 DIAGNOSIS — Z8719 Personal history of other diseases of the digestive system: Secondary | ICD-10-CM | POA: Diagnosis not present

## 2024-04-14 DIAGNOSIS — M19072 Primary osteoarthritis, left ankle and foot: Secondary | ICD-10-CM

## 2024-04-14 DIAGNOSIS — Z8781 Personal history of (healed) traumatic fracture: Secondary | ICD-10-CM | POA: Diagnosis not present

## 2024-04-14 DIAGNOSIS — R5383 Other fatigue: Secondary | ICD-10-CM

## 2024-04-14 DIAGNOSIS — R7689 Other specified abnormal immunological findings in serum: Secondary | ICD-10-CM | POA: Diagnosis not present

## 2024-04-14 DIAGNOSIS — M353 Polymyalgia rheumatica: Secondary | ICD-10-CM

## 2024-04-14 DIAGNOSIS — M5030 Other cervical disc degeneration, unspecified cervical region: Secondary | ICD-10-CM | POA: Diagnosis not present

## 2024-04-14 DIAGNOSIS — Z8612 Personal history of poliomyelitis: Secondary | ICD-10-CM

## 2024-04-14 DIAGNOSIS — M19071 Primary osteoarthritis, right ankle and foot: Secondary | ICD-10-CM

## 2024-04-14 DIAGNOSIS — M19042 Primary osteoarthritis, left hand: Secondary | ICD-10-CM

## 2024-04-14 DIAGNOSIS — L80 Vitiligo: Secondary | ICD-10-CM

## 2024-09-02 ENCOUNTER — Other Ambulatory Visit

## 2024-09-16 ENCOUNTER — Ambulatory Visit: Admitting: Hematology and Oncology
